# Patient Record
Sex: Male | Born: 1935 | Race: White | Hispanic: No | Marital: Married | Smoking: Never smoker
Health system: Southern US, Community
[De-identification: ages and names within clinical notes are randomized; demographics above are authoritative.]

## PROBLEM LIST (undated history)

## (undated) DIAGNOSIS — L84 Corns and callosities: Secondary | ICD-10-CM

## (undated) DIAGNOSIS — I10 Essential (primary) hypertension: Secondary | ICD-10-CM

## (undated) DIAGNOSIS — D649 Anemia, unspecified: Secondary | ICD-10-CM

## (undated) DIAGNOSIS — K289 Gastrojejunal ulcer, unspecified as acute or chronic, without hemorrhage or perforation: Secondary | ICD-10-CM

## (undated) DIAGNOSIS — E119 Type 2 diabetes mellitus without complications: Secondary | ICD-10-CM

## (undated) HISTORY — DX: Type 2 diabetes mellitus without complications: E11.9

## (undated) HISTORY — DX: Essential (primary) hypertension: I10

## (undated) HISTORY — DX: Corns and callosities: L84

## (undated) HISTORY — DX: Gastrojejunal ulcer, unspecified as acute or chronic, without hemorrhage or perforation: K28.9

## (undated) HISTORY — DX: Anemia, unspecified: D64.9

---

## 2000-10-10 ENCOUNTER — Emergency Department (HOSPITAL_COMMUNITY): Admission: EM | Admit: 2000-10-10 | Discharge: 2000-10-10 | Payer: Self-pay | Admitting: Emergency Medicine

## 2000-10-20 ENCOUNTER — Emergency Department (HOSPITAL_COMMUNITY): Admission: EM | Admit: 2000-10-20 | Discharge: 2000-10-20 | Payer: Self-pay | Admitting: Emergency Medicine

## 2006-12-21 ENCOUNTER — Inpatient Hospital Stay (HOSPITAL_COMMUNITY): Admission: AD | Admit: 2006-12-21 | Discharge: 2006-12-22 | Payer: Self-pay | Admitting: Endocrinology

## 2007-10-03 ENCOUNTER — Ambulatory Visit (HOSPITAL_COMMUNITY): Admission: RE | Admit: 2007-10-03 | Discharge: 2007-10-03 | Payer: Self-pay | Admitting: Ophthalmology

## 2008-01-03 ENCOUNTER — Ambulatory Visit (HOSPITAL_COMMUNITY): Admission: RE | Admit: 2008-01-03 | Discharge: 2008-01-03 | Payer: Self-pay | Admitting: Ophthalmology

## 2008-04-22 ENCOUNTER — Ambulatory Visit (HOSPITAL_COMMUNITY): Admission: RE | Admit: 2008-04-22 | Discharge: 2008-04-22 | Payer: Self-pay | Admitting: Ophthalmology

## 2008-05-06 ENCOUNTER — Ambulatory Visit (HOSPITAL_COMMUNITY): Admission: RE | Admit: 2008-05-06 | Discharge: 2008-05-06 | Payer: Self-pay | Admitting: Ophthalmology

## 2010-11-17 NOTE — Op Note (Signed)
[name], [name]             ACCOUNT NO.:  [id number]   MEDICAL RECORD NO.:  [id number]          PATIENT TYPE:  AMB   LOCATION:  SDS                          FACILITY:  [facility]   PHYSICIAN:  Clent Demark. Rankin, M.D.   DATE OF BIRTH:  [birth year]   DATE OF PROCEDURE:  04/22/2008  DATE OF DISCHARGE:  04/22/2008                               OPERATIVE REPORT   PREOPERATIVE DIAGNOSIS:  Nuclear sclerotic cataract, right eye.   POSTOPERATIVE DIAGNOSIS:  Nuclear sclerotic cataract, right eye.   PROCEDURES:  1. Phacoemulsification and cataract extraction with a posterior      chamber intraocular lens placement in the right eye in the sulcus.  2. Anterior vitrectomy, right eye after posterior capsule rupture.   SURGEON:  Clent Demark. Rankin, MD   ANESTHESIA:  Local retrobulbar, monitored anesthetic control.   INDICATIONS FOR PROCEDURE:  The patient is a 77 year old man who has  advanced nuclear sclerotic cataract.  He also has impairment of  activities of daily living on the right eye on the basis of dense  cataract.  He understands the risks in attempt to remove the cataract.  He also understands the risks for surgical intervention from the  underlying condition as well as surgical repair including but not  limited to hemorrhage, infection, scarring, need for another surgery, no  change in vision, loss of vision, and progressive disease despite  intervention.   PROCEDURE IN DETAIL:  Appropriate signed consent was obtained and the  patient was taken to the operating room.  In the operating room,  appropriate monitors were applied followed by mild sedation.  Xylocaine  2% was injected 5 mL retrobulbar with additional 5 mL laterally in  fashion of modified Kirk Ruths.  The right periocular region was sterilely  prepped and draped in the usual ophthalmic fashion.  Lid speculum was  applied.  A 2.5 mm diamond blade was then used to create a biplanar  clear corneal incision.  At this time, the  anterior chamber was deepened  with Viscoat.  Bent cystotome needle was then used to create a nice  round circular capsulorrhexis.  Hydrodissection was then carried out.  There was split that occurred in the rent in the anterior capsule which  may have gone on towards the equatorial region at the 5 o'clock  position.  This area had to be watched throughout the case.   Phacoemulsification was then carried on the bag.  After formation of a Y-  groove, segments were then fractured and then the cataract was removed  and these multiple segments fraction peripherally and then after the  plate was removed getting down to the last 3 o'clock hour superiorly, it  was noted that a rent developed in posterior capsule and apparent  knuckle of vitreous came forward.  Viscoat was then used to push this  back.  This allowed removal of all nuclear and epinuclear type material  superiorly.  Bimanual phacoemulsification was then used with the support  with a second instrument in the eye.   It was clear at this time that a large posterior capsule opening was  then present.  Wide vitrectomy was then carried out.  This allowed  removal of the herniated vitreous.  At this time, peripheral capsule was  inspected and found to be free of any large areas of missing anterior  capsule.  For this reason, the anterior chamber was now deepened and the  sulcus was opened with Provisc.  A [id number] lens style from [employer] with a  power +23 was selected and placed into the sulcus and rotated in  horizontal position.  Excellent position was obtained.  It must be noted  that the clear corneal incision was extended, so as to allow passage of  the awl, PMMA lens.  At this time, the anterior chamber was deepened  again.  The clear cornea incision was then closed with interrupted 10-0  nylon sutures and the knots were rotated to bury.  Wound was checked and  found to be secure.  Appropriate tension was left in the globe with BSS.   At this time, subconjunctival Decadron applied.  Sterile patch and Fox  shield applied.  The patient tolerated the procedure without  complication.      Clent Demark Rankin, M.D.  Electronically Signed     GAR/MEDQ  D:  04/22/2008  T:  04/23/2008  Job:  [id number]

## 2010-11-17 NOTE — Op Note (Signed)
[name], [name]             ACCOUNT NO.:  [id number]   MEDICAL RECORD NO.:  [id number]          PATIENT TYPE:  AMB   LOCATION:  SDS                          FACILITY:  [facility]   PHYSICIAN:  Clent Demark. Rankin, M.D.   DATE OF BIRTH:  [birth year]   DATE OF PROCEDURE:  DATE OF DISCHARGE:  05/06/2008                               OPERATIVE REPORT   PREOPERATIVE DIAGNOSIS:  Subluxed intraocular lens, right eye  inferiorly.   POSTOPERATIVE DIAGNOSIS:  Subluxed intraocular lens, right eye  inferiorly.   PROCEDURES:  1. Posterior vitrectomy - 25-gauge, right eye.  2. Repositioning of posterior chamber intraocular lens right eye into      the sulcus with anterior capsule, optic and haptics.   SURGEON:  Clent Demark. Rankin, MD   ANESTHESIA:  Local retrobulbar, monitored anesthesia control.   INDICATION FOR PROCEDURE:  The patient is a 77 year old man who has  distorted vision in the right eye on the basis of subluxed intraocular  lens.  The patient understands this an attempt to reposition and place  the lens into more secure position so as to prevent subluxation.  He  understands the risks of anesthesia including rare occurrence of death,  loss of the eye including, but not limited to hemorrhage, infection,  scarring, need further surgery, no change in vision, loss of vision,  progressive disease despite intervention.  Appropriate signed consent  was obtained.  The patient was taken to the operating room.  In the  operating room, appropriate monitors followed by mild sedation.  A 2%  Xylocaine 5 mL was injected into retrobulbar with additional 5 mL  laterally in fashion of a modified [employer].  The right periocular  region was sterilely prepped and draped in the usual sterile fashion.  Lid speculum was applied.  A 25-gauge trocar was placed in the  inferotemporal quadrant.  The infusion was then turned on.  Superior  trocars applied.  Core vitrectomy was then begun.   Vitreous skirt was  then trimmed.  Intraocular lens was identified to  have optic which had slipped posterior to the anterior capsule  leaflets.  The subluxation occurred centered inferiorly at the 5 o'clock  position.  The lens was supported posteriorly.  A paracentesis incision  was fashioned superiorly to the anterior chamber.  Anterior chamber was  deepened with Viscoat.  At this time, the IOL was supported from the  posterior site of the lens using a light plate.  Thereafter, a Wynetta Emery  IOL rotator was in place across paracentesis incision and this allowed  for the intraocular lens, we brought further into the sulcus.  The optic  was delivered anterior to the capsular leaflets.  The lens was then  rotated into a 9 and 3 o'clock position.  This gave excellent support,  excellent centration.  No complications occurred.  At this time, the  second look of the vitreous to confirm there was no holes or tears.  No  complications occurred.  The Viscoat in the anterior chamber was removed  with vitrectomy instrumentation.  At this time, the instruments were  removed from the  eye.  The superior trocar was removed under appropriate  pressures and the wounds were secured.  A 2-0 nylon suture was in place  in the opening as well as to close that opening superiorly and  the knot was rotated to bury.  Sterile patch and Fox shield applied  after subconjunctival Decadron applied as well as the infusion being  removed.  The patient was taken to the Short-Stay Unit and discharged  home as an outpatient without complications.      Clent Demark Rankin, M.D.  Electronically Signed     GAR/MEDQ  D:  05/06/2008  T:  05/07/2008  Job:  [id number]

## 2010-11-17 NOTE — H&P (Signed)
[name], [name]             ACCOUNT NO.:  [id number]   MEDICAL RECORD NO.:  [id number]          PATIENT TYPE:  INP   LOCATION:  3736                         FACILITY:  [facility]   PHYSICIAN:  Parke Poisson. Gegick, M.D.DATE OF BIRTH:  09/24/1927   DATE OF ADMISSION:  12/21/2006  DATE OF DISCHARGE:                              HISTORY & PHYSICAL   CHIEF COMPLAINT:  This is a 77 year old man who presents with a history  of hyperkalemia.   HISTORY OF PRESENT ILLNESS:  The patient was seen 2 days ago on a  routine office visit for follow-up.  It was learned at that time that he  was taking Aldactone instead of the prescribed 25 mg he was taking 50  mg.  He had was found to have a potassium level was 6.3.  He was treated  with increased Lasix.  The Aldactone was discontinued and return follow-  up visit shows his potassium is 6.6.  He does not have any renal  deficiency.  He does have a creatinine of 1.0.  The patient is  asymptomatic.  Electrocardiogram showed no changes.  The patient will be  admitted to the hospital for Kayexalate therapy.   He has a history of type 2 diabetes which has been present since 1979.  His A1C in June was 5.2%.  He has been receiving metformin plus Actos.   He has a history of hypertension that has been controlled and also has a  history of prostate hypertrophy.  In the past, he has had a peptic ulcer  disease and has had frequent GI bleeds, but since he has been taking  Zantac this has resolved.  He also has a history of pedal edema for  which he is taking Lasix.  Recently he was found to have pernicious  anemia.  His B12 level was 103 and his hemoglobin was 8.7.  His  hemoglobin now is 9.3, and he feels significantly improved now that he  is taking vitamin B12.   PAST MEDICAL HISTORY:  1. He has been hospitalized in [place] for esophageal      problems.  2. History of having osteomyelitis for approximately 20 years and      finally this  healed.   MEDICATIONS PRIOR TO THIS ADMISSION:  1. Glucophage 1000 mg one b.i.d.  2. Actos 45 mg one daily (recently stopped).  3. Cardizem CD 240 mg one daily.  4. Lasix 20 mg three daily.  5. Humibid p.r.n.  6. Cardura 8 mg 1 q.p.m.  7. Pravachol 40 mg one daily.   ALLERGIES:  No known drug allergies.   SOCIAL HISTORY:  He denies excessive alcoholic intake.   REVIEW OF SYSTEMS:  His weight has been stable, except he did lose 3  pounds the last 2 days.  CARDIOVASCULAR/RESPIRATORY:  No history of  chest pain.  GI: No complaints.   PHYSICAL EXAMINATION:  GENERAL:  This is a well-developed man who  appears in no distress.  LUNGS:  Clear.  CARDIOVASCULAR:  Exam rhythm is regular.  An electrocardiogram was done  and this was normal.  ABDOMEN:  Soft.  No masses are present.  No tenderness is present.  EXTREMITIES:  Show 3+ pedal edema.  NEUROMUSCULAR:  Grossly intact.   IMPRESSION:  1. Hyperkalemia secondary to previous Aldactone use.  2. Diabetes mellitus type 2.  3. History of hypertension.  4. History of benign prostatic hypertrophy.  5. History of peptic ulcer disease.  6. History of pernicious anemia.           ______________________________  Parke Poisson. Electa Sniff, M.D.     CGG/MEDQ  D:  12/21/2006  T:  12/21/2006  Job:  [id number]

## 2010-11-20 NOTE — Discharge Summary (Signed)
[name], TINCHER             ACCOUNT NO.:  [id number]   MEDICAL RECORD NO.:  [id number]          PATIENT TYPE:  INP   LOCATION:  3736                         FACILITY:  [facility]   PHYSICIAN:  Parke Poisson. Gegick, M.D.DATE OF BIRTH:  [birth year]   DATE OF ADMISSION:  12/21/2006  DATE OF DISCHARGE:  12/22/2006                               DISCHARGE SUMMARY   HISTORY:  This is a 77 year old man who presents with a history of  hyperkalemia.  The patient was seen 2 days prior in the office, and at  that time he was taking Aldactone which was prescribed as 25 mg.  He was  taking two of them.  He had a potassium level of 6.3.  He was treated  with Lasix as an outpatient, and Aldactone was discontinued but presents  now with a potassium of 6.6.  He did not have any symptoms.  His  creatinine was 1.0.  The patient was asymptomatic, but in fear of his  markedly elevated potassium, it was deemed necessary to admit the  patient.  Impression on admission was hyperkalemia, secondary to  previous Aldactone use, diabetes mellitus type 2, history of  hypertension, history of benign prostatic hypertrophy, history of peptic  ulcer disease, history of pernicious anemia.   HOSPITAL COURSE:  The patient was admitted to the hospital and was given  IV fluids.  His potassium was 6.6.  He was also given Lasix.  His  potassium decreased to 4.9 the following day.  He felt well.  There were  no essentially no symptoms at that time.  He was then discharged.   IMPRESSION:  On discharge:  Hyperkalemia secondary to inappropriate use  of Aldactone.  That is the diagnosis on discharge.   MEDICATIONS ON DISCHARGE:  He will continue his usual medications which  include Actos 45 mg once daily, Cardizem CD 240 mg, one daily; Lasix 20  mg one daily, Cardura 8 mg one each evening and Pravachol 40 mg one  daily.   DISCHARGE DIET:  Carbohydrate-restricted diet.   ACTIVITY ON DISCHARGE:  As tolerated.   FOLLOWUP:  He  will be seen in the office in a period of 3 weeks.   CONDITION ON DISCHARGE:  Improved.           ______________________________  Parke Poisson Electa Sniff, M.D.     CGG/MEDQ  D:  02/18/2007  T:  02/19/2007  Job:  [id number]

## 2011-03-29 LAB — CBC
HCT: 30.4 — ABNORMAL LOW
Hemoglobin: 10.3 — ABNORMAL LOW
MCHC: 33.8
MCV: 97.3
Platelets: 140 — ABNORMAL LOW
RBC: 3.13 — ABNORMAL LOW
RDW: 14.7
WBC: 5.7

## 2011-03-29 LAB — BASIC METABOLIC PANEL WITH GFR
BUN: 25 — ABNORMAL HIGH
CO2: 26
Calcium: 9.2
Chloride: 108
Creatinine, Ser: 1.24
GFR calc non Af Amer: 57 — ABNORMAL LOW
Glucose, Bld: 111 — ABNORMAL HIGH
Potassium: 4.2
Sodium: 141

## 2011-04-01 LAB — BASIC METABOLIC PANEL
CO2: 26
Creatinine, Ser: 1.38
GFR calc Af Amer: >60
GFR calc non Af Amer: 50 — ABNORMAL LOW
Sodium: 141

## 2011-04-01 LAB — CBC
MCV: 98
Platelets: 148 — ABNORMAL LOW
RBC: 3.02 — ABNORMAL LOW
RDW: 14.8

## 2011-04-05 LAB — BASIC METABOLIC PANEL
CO2: 24
Calcium: 9.5
Glucose, Bld: 134 — ABNORMAL HIGH

## 2011-04-05 LAB — CBC
MCHC: 33
MCV: 99.5

## 2011-04-05 LAB — GLUCOSE, CAPILLARY
Glucose-Capillary: 105 — ABNORMAL HIGH
Glucose-Capillary: 134 — ABNORMAL HIGH

## 2011-04-06 LAB — BASIC METABOLIC PANEL
BUN: 23
Calcium: 9.5
Chloride: 111
GFR calc Af Amer: >60
GFR calc non Af Amer: >60
Glucose, Bld: 125 — ABNORMAL HIGH

## 2011-04-06 LAB — CBC
HCT: 33 — ABNORMAL LOW
MCHC: 33.3
Platelets: 156
RBC: 3.3 — ABNORMAL LOW
WBC: 5.4

## 2011-04-21 LAB — CBC
Hemoglobin: 9.1 — ABNORMAL LOW
MCHC: 33
Platelets: 138 — ABNORMAL LOW
RBC: 2.77 — ABNORMAL LOW

## 2011-04-21 LAB — COMPREHENSIVE METABOLIC PANEL
Calcium: 9.3
Chloride: 116 — ABNORMAL HIGH
Creatinine, Ser: 1.15
GFR calc Af Amer: >60
Total Bilirubin: 0.4

## 2011-04-21 LAB — BASIC METABOLIC PANEL
BUN: 26 — ABNORMAL HIGH
CO2: 23
Calcium: 8.9
GFR calc Af Amer: >60
Glucose, Bld: 93
Potassium: 4.9
Sodium: 141

## 2011-04-21 LAB — CORTISOL: Cortisol, Plasma: 9

## 2011-07-16 DIAGNOSIS — E1149 Type 2 diabetes mellitus with other diabetic neurological complication: Secondary | ICD-10-CM | POA: Diagnosis not present

## 2011-07-19 DIAGNOSIS — E1139 Type 2 diabetes mellitus with other diabetic ophthalmic complication: Secondary | ICD-10-CM | POA: Diagnosis not present

## 2011-07-19 DIAGNOSIS — H43819 Vitreous degeneration, unspecified eye: Secondary | ICD-10-CM | POA: Diagnosis not present

## 2011-07-19 DIAGNOSIS — E11329 Type 2 diabetes mellitus with mild nonproliferative diabetic retinopathy without macular edema: Secondary | ICD-10-CM | POA: Diagnosis not present

## 2011-07-19 DIAGNOSIS — H2510 Age-related nuclear cataract, unspecified eye: Secondary | ICD-10-CM | POA: Diagnosis not present

## 2011-09-27 DIAGNOSIS — L820 Inflamed seborrheic keratosis: Secondary | ICD-10-CM | POA: Diagnosis not present

## 2011-10-22 DIAGNOSIS — E1149 Type 2 diabetes mellitus with other diabetic neurological complication: Secondary | ICD-10-CM | POA: Diagnosis not present

## 2011-10-22 DIAGNOSIS — L608 Other nail disorders: Secondary | ICD-10-CM | POA: Diagnosis not present

## 2011-10-25 DIAGNOSIS — I10 Essential (primary) hypertension: Secondary | ICD-10-CM | POA: Diagnosis not present

## 2011-10-25 DIAGNOSIS — E119 Type 2 diabetes mellitus without complications: Secondary | ICD-10-CM | POA: Diagnosis not present

## 2011-10-25 DIAGNOSIS — Z125 Encounter for screening for malignant neoplasm of prostate: Secondary | ICD-10-CM | POA: Diagnosis not present

## 2011-10-25 DIAGNOSIS — E785 Hyperlipidemia, unspecified: Secondary | ICD-10-CM | POA: Diagnosis not present

## 2011-11-01 DIAGNOSIS — I10 Essential (primary) hypertension: Secondary | ICD-10-CM | POA: Diagnosis not present

## 2011-11-01 DIAGNOSIS — R809 Proteinuria, unspecified: Secondary | ICD-10-CM | POA: Diagnosis not present

## 2011-11-01 DIAGNOSIS — Z0000 Encounter for general adult medical examination without abnormal findings: Secondary | ICD-10-CM | POA: Diagnosis not present

## 2011-11-01 DIAGNOSIS — E1129 Type 2 diabetes mellitus with other diabetic kidney complication: Secondary | ICD-10-CM | POA: Diagnosis not present

## 2011-11-01 DIAGNOSIS — Z125 Encounter for screening for malignant neoplasm of prostate: Secondary | ICD-10-CM | POA: Diagnosis not present

## 2011-11-02 DIAGNOSIS — Z1212 Encounter for screening for malignant neoplasm of rectum: Secondary | ICD-10-CM | POA: Diagnosis not present

## 2012-01-17 DIAGNOSIS — E1139 Type 2 diabetes mellitus with other diabetic ophthalmic complication: Secondary | ICD-10-CM | POA: Diagnosis not present

## 2012-01-17 DIAGNOSIS — H2510 Age-related nuclear cataract, unspecified eye: Secondary | ICD-10-CM | POA: Diagnosis not present

## 2012-01-17 DIAGNOSIS — E11329 Type 2 diabetes mellitus with mild nonproliferative diabetic retinopathy without macular edema: Secondary | ICD-10-CM | POA: Diagnosis not present

## 2012-01-18 DIAGNOSIS — E1149 Type 2 diabetes mellitus with other diabetic neurological complication: Secondary | ICD-10-CM | POA: Diagnosis not present

## 2012-01-18 DIAGNOSIS — Q828 Other specified congenital malformations of skin: Secondary | ICD-10-CM | POA: Diagnosis not present

## 2012-01-18 DIAGNOSIS — L608 Other nail disorders: Secondary | ICD-10-CM | POA: Diagnosis not present

## 2012-03-28 DIAGNOSIS — L821 Other seborrheic keratosis: Secondary | ICD-10-CM | POA: Diagnosis not present

## 2012-03-28 DIAGNOSIS — C4442 Squamous cell carcinoma of skin of scalp and neck: Secondary | ICD-10-CM | POA: Diagnosis not present

## 2012-03-28 DIAGNOSIS — L820 Inflamed seborrheic keratosis: Secondary | ICD-10-CM | POA: Diagnosis not present

## 2012-04-11 DIAGNOSIS — Q828 Other specified congenital malformations of skin: Secondary | ICD-10-CM | POA: Diagnosis not present

## 2012-04-11 DIAGNOSIS — L608 Other nail disorders: Secondary | ICD-10-CM | POA: Diagnosis not present

## 2012-04-11 DIAGNOSIS — E1149 Type 2 diabetes mellitus with other diabetic neurological complication: Secondary | ICD-10-CM | POA: Diagnosis not present

## 2012-04-17 DIAGNOSIS — Z23 Encounter for immunization: Secondary | ICD-10-CM | POA: Diagnosis not present

## 2012-05-22 DIAGNOSIS — D510 Vitamin B12 deficiency anemia due to intrinsic factor deficiency: Secondary | ICD-10-CM | POA: Diagnosis not present

## 2012-05-22 DIAGNOSIS — E1129 Type 2 diabetes mellitus with other diabetic kidney complication: Secondary | ICD-10-CM | POA: Diagnosis not present

## 2012-05-22 DIAGNOSIS — E785 Hyperlipidemia, unspecified: Secondary | ICD-10-CM | POA: Diagnosis not present

## 2012-05-22 DIAGNOSIS — Z1331 Encounter for screening for depression: Secondary | ICD-10-CM | POA: Diagnosis not present

## 2012-05-22 DIAGNOSIS — I10 Essential (primary) hypertension: Secondary | ICD-10-CM | POA: Diagnosis not present

## 2012-07-04 ENCOUNTER — Other Ambulatory Visit: Payer: Self-pay | Admitting: Internal Medicine

## 2012-07-04 DIAGNOSIS — R1310 Dysphagia, unspecified: Secondary | ICD-10-CM | POA: Diagnosis not present

## 2012-07-04 DIAGNOSIS — K219 Gastro-esophageal reflux disease without esophagitis: Secondary | ICD-10-CM | POA: Diagnosis not present

## 2012-07-04 DIAGNOSIS — K449 Diaphragmatic hernia without obstruction or gangrene: Secondary | ICD-10-CM | POA: Diagnosis not present

## 2012-07-13 ENCOUNTER — Ambulatory Visit
Admission: RE | Admit: 2012-07-13 | Discharge: 2012-07-13 | Disposition: A | Discharge status: Home / Self Care | Payer: Medicare Other | Source: Ambulatory Visit | Attending: Internal Medicine | Admitting: Internal Medicine

## 2012-07-13 DIAGNOSIS — K219 Gastro-esophageal reflux disease without esophagitis: Secondary | ICD-10-CM

## 2012-07-13 DIAGNOSIS — K220 Achalasia of cardia: Secondary | ICD-10-CM | POA: Diagnosis not present

## 2012-07-31 DIAGNOSIS — K220 Achalasia of cardia: Secondary | ICD-10-CM | POA: Diagnosis not present

## 2012-08-08 DIAGNOSIS — K220 Achalasia of cardia: Secondary | ICD-10-CM | POA: Diagnosis not present

## 2012-10-10 DIAGNOSIS — E1149 Type 2 diabetes mellitus with other diabetic neurological complication: Secondary | ICD-10-CM | POA: Diagnosis not present

## 2012-10-10 DIAGNOSIS — L608 Other nail disorders: Secondary | ICD-10-CM | POA: Diagnosis not present

## 2012-10-10 DIAGNOSIS — Q828 Other specified congenital malformations of skin: Secondary | ICD-10-CM | POA: Diagnosis not present

## 2012-10-16 DIAGNOSIS — E11329 Type 2 diabetes mellitus with mild nonproliferative diabetic retinopathy without macular edema: Secondary | ICD-10-CM | POA: Diagnosis not present

## 2012-10-16 DIAGNOSIS — H2510 Age-related nuclear cataract, unspecified eye: Secondary | ICD-10-CM | POA: Diagnosis not present

## 2012-10-16 DIAGNOSIS — E1139 Type 2 diabetes mellitus with other diabetic ophthalmic complication: Secondary | ICD-10-CM | POA: Diagnosis not present

## 2012-10-31 DIAGNOSIS — Z125 Encounter for screening for malignant neoplasm of prostate: Secondary | ICD-10-CM | POA: Diagnosis not present

## 2012-10-31 DIAGNOSIS — I10 Essential (primary) hypertension: Secondary | ICD-10-CM | POA: Diagnosis not present

## 2012-10-31 DIAGNOSIS — E785 Hyperlipidemia, unspecified: Secondary | ICD-10-CM | POA: Diagnosis not present

## 2012-10-31 DIAGNOSIS — E1129 Type 2 diabetes mellitus with other diabetic kidney complication: Secondary | ICD-10-CM | POA: Diagnosis not present

## 2012-10-31 DIAGNOSIS — D510 Vitamin B12 deficiency anemia due to intrinsic factor deficiency: Secondary | ICD-10-CM | POA: Diagnosis not present

## 2012-11-02 DIAGNOSIS — Z1212 Encounter for screening for malignant neoplasm of rectum: Secondary | ICD-10-CM | POA: Diagnosis not present

## 2012-11-07 DIAGNOSIS — Z125 Encounter for screening for malignant neoplasm of prostate: Secondary | ICD-10-CM | POA: Diagnosis not present

## 2012-11-07 DIAGNOSIS — Z6832 Body mass index (BMI) 32.0-32.9, adult: Secondary | ICD-10-CM | POA: Diagnosis not present

## 2012-11-07 DIAGNOSIS — D510 Vitamin B12 deficiency anemia due to intrinsic factor deficiency: Secondary | ICD-10-CM | POA: Diagnosis not present

## 2012-11-07 DIAGNOSIS — R1310 Dysphagia, unspecified: Secondary | ICD-10-CM | POA: Diagnosis not present

## 2012-11-07 DIAGNOSIS — K219 Gastro-esophageal reflux disease without esophagitis: Secondary | ICD-10-CM | POA: Diagnosis not present

## 2012-11-07 DIAGNOSIS — N400 Enlarged prostate without lower urinary tract symptoms: Secondary | ICD-10-CM | POA: Diagnosis not present

## 2012-11-07 DIAGNOSIS — E11319 Type 2 diabetes mellitus with unspecified diabetic retinopathy without macular edema: Secondary | ICD-10-CM | POA: Diagnosis not present

## 2012-11-07 DIAGNOSIS — K220 Achalasia of cardia: Secondary | ICD-10-CM | POA: Diagnosis not present

## 2012-11-07 DIAGNOSIS — Z0000 Encounter for general adult medical examination without abnormal findings: Secondary | ICD-10-CM | POA: Diagnosis not present

## 2013-01-09 DIAGNOSIS — E1149 Type 2 diabetes mellitus with other diabetic neurological complication: Secondary | ICD-10-CM | POA: Diagnosis not present

## 2013-01-09 DIAGNOSIS — Q828 Other specified congenital malformations of skin: Secondary | ICD-10-CM | POA: Diagnosis not present

## 2013-01-09 DIAGNOSIS — L608 Other nail disorders: Secondary | ICD-10-CM | POA: Diagnosis not present

## 2013-03-09 DIAGNOSIS — Z23 Encounter for immunization: Secondary | ICD-10-CM | POA: Diagnosis not present

## 2013-03-26 DIAGNOSIS — L821 Other seborrheic keratosis: Secondary | ICD-10-CM | POA: Diagnosis not present

## 2013-03-26 DIAGNOSIS — Z85828 Personal history of other malignant neoplasm of skin: Secondary | ICD-10-CM | POA: Diagnosis not present

## 2013-03-26 DIAGNOSIS — L570 Actinic keratosis: Secondary | ICD-10-CM | POA: Diagnosis not present

## 2013-03-29 ENCOUNTER — Encounter: Payer: Self-pay | Admitting: *Deleted

## 2013-03-29 DIAGNOSIS — E119 Type 2 diabetes mellitus without complications: Secondary | ICD-10-CM

## 2013-03-29 DIAGNOSIS — K289 Gastrojejunal ulcer, unspecified as acute or chronic, without hemorrhage or perforation: Secondary | ICD-10-CM | POA: Insufficient documentation

## 2013-04-06 ENCOUNTER — Ambulatory Visit (INDEPENDENT_AMBULATORY_CARE_PROVIDER_SITE_OTHER): Payer: Medicare Other

## 2013-04-06 VITALS — BP 142/64 | HR 86 | Resp 16 | Ht 74.0 in | Wt 250.0 lb

## 2013-04-06 DIAGNOSIS — E1149 Type 2 diabetes mellitus with other diabetic neurological complication: Secondary | ICD-10-CM | POA: Diagnosis not present

## 2013-04-06 DIAGNOSIS — B351 Tinea unguium: Secondary | ICD-10-CM

## 2013-04-06 DIAGNOSIS — Q828 Other specified congenital malformations of skin: Secondary | ICD-10-CM

## 2013-04-06 DIAGNOSIS — E1142 Type 2 diabetes mellitus with diabetic polyneuropathy: Secondary | ICD-10-CM

## 2013-04-06 NOTE — Patient Instructions (Addendum)

## 2013-04-06 NOTE — Progress Notes (Addendum)
°  Subjective:    Patient ID: [name], male    DOB: 1927/10/30, 79 y.o.   MRN: [id number]  HPI [name] resents at this time for followup diabetic foot and nail care. Patient has thick dystrophic friable nails x10 and hemorrhagic keratoses sub-first met right. The lesion plantar right foot is most painful and symptomatic due to increased activities in past weeks, patient was cutting down some trees patient continues to have +1 edema bilateral lower extremities history of cellulitis of his left lower leg. No open wounds or ulcers noted at this time     Review of Systems  Respiratory: Negative.   Cardiovascular: Positive for leg swelling.  Musculoskeletal: Positive for arthralgias.  Skin: Positive for wound.  Neurological: Negative.   Psychiatric/Behavioral: Negative.        Objective:   Physical Exam  Constitutional: He is oriented to person, place, and time. He appears well-developed and well-nourished.  HENT:  Head: Atraumatic.  Cardiovascular:  Pulses:      Dorsalis pedis pulses are 1+ on the right side, and 1+ on the left side.       Posterior tibial pulses are 2+ on the right side, and 2+ on the left side.  Capillary refill timed 3-4 seconds all digits temperature warm mild to moderate varicosities noted  Musculoskeletal: Normal range of motion. He exhibits edema.  Neurological: He is alert and oriented to person, place, and time. He has normal reflexes.  Skin: Skin is dry. No cyanosis. Nails show no clubbing.  Old area cellulitis right lower leg no active drainage or discharge. Pre-ulcer keratoses sub-first right no active drainage or discharge, tender on palpation weightbearing. Nails are thick discolored brittle and friable with onychomycosis and nail dystrophy one through 5 bilateral no secondary infection is noted. Assisted with onychomycosis  Psychiatric: He has a normal mood and affect. His behavior is normal. Judgment and thought content normal.           Assessment & Plan:  Assessment diabetes with cutting factor diminished neurovascular status noted absent epicritic and proprioceptive sensations in [employer] testing the forefoot and digits thick frontal mycotic nails debrided x10 and multiple keratoses debridement someone 5 right maintain appropriate diabetic accommodative shoes at all times. Reappointed 3 months for palliative care  Harriet Masson DPM

## 2013-05-22 DIAGNOSIS — D510 Vitamin B12 deficiency anemia due to intrinsic factor deficiency: Secondary | ICD-10-CM | POA: Diagnosis not present

## 2013-05-22 DIAGNOSIS — K220 Achalasia of cardia: Secondary | ICD-10-CM | POA: Diagnosis not present

## 2013-05-22 DIAGNOSIS — E1129 Type 2 diabetes mellitus with other diabetic kidney complication: Secondary | ICD-10-CM | POA: Diagnosis not present

## 2013-05-22 DIAGNOSIS — M1990 Unspecified osteoarthritis, unspecified site: Secondary | ICD-10-CM | POA: Diagnosis not present

## 2013-05-22 DIAGNOSIS — Z1331 Encounter for screening for depression: Secondary | ICD-10-CM | POA: Diagnosis not present

## 2013-05-22 DIAGNOSIS — N529 Male erectile dysfunction, unspecified: Secondary | ICD-10-CM | POA: Diagnosis not present

## 2013-05-22 DIAGNOSIS — R809 Proteinuria, unspecified: Secondary | ICD-10-CM | POA: Diagnosis not present

## 2013-05-22 DIAGNOSIS — E669 Obesity, unspecified: Secondary | ICD-10-CM | POA: Diagnosis not present

## 2013-05-22 DIAGNOSIS — E11319 Type 2 diabetes mellitus with unspecified diabetic retinopathy without macular edema: Secondary | ICD-10-CM | POA: Diagnosis not present

## 2013-05-25 DIAGNOSIS — I10 Essential (primary) hypertension: Secondary | ICD-10-CM | POA: Diagnosis not present

## 2013-05-28 DIAGNOSIS — E875 Hyperkalemia: Secondary | ICD-10-CM | POA: Diagnosis not present

## 2013-06-01 DIAGNOSIS — E875 Hyperkalemia: Secondary | ICD-10-CM | POA: Diagnosis not present

## 2013-07-10 ENCOUNTER — Telehealth: Payer: Self-pay | Admitting: *Deleted

## 2013-07-10 ENCOUNTER — Other Ambulatory Visit: Payer: Self-pay

## 2013-07-10 ENCOUNTER — Ambulatory Visit (INDEPENDENT_AMBULATORY_CARE_PROVIDER_SITE_OTHER): Payer: Medicare Other

## 2013-07-10 VITALS — BP 129/62 | HR 74 | Resp 12

## 2013-07-10 DIAGNOSIS — E119 Type 2 diabetes mellitus without complications: Secondary | ICD-10-CM

## 2013-07-10 DIAGNOSIS — L03119 Cellulitis of unspecified part of limb: Principal | ICD-10-CM

## 2013-07-10 DIAGNOSIS — E1149 Type 2 diabetes mellitus with other diabetic neurological complication: Secondary | ICD-10-CM | POA: Diagnosis not present

## 2013-07-10 DIAGNOSIS — E1142 Type 2 diabetes mellitus with diabetic polyneuropathy: Secondary | ICD-10-CM | POA: Diagnosis not present

## 2013-07-10 DIAGNOSIS — L02619 Cutaneous abscess of unspecified foot: Secondary | ICD-10-CM

## 2013-07-10 DIAGNOSIS — Q828 Other specified congenital malformations of skin: Secondary | ICD-10-CM

## 2013-07-10 DIAGNOSIS — B351 Tinea unguium: Secondary | ICD-10-CM

## 2013-07-10 DIAGNOSIS — L608 Other nail disorders: Secondary | ICD-10-CM

## 2013-07-10 MED ORDER — CEPHALEXIN 500 MG PO CAPS: 500.0000 mg | ORAL_CAPSULE | Freq: Three times a day (TID) | ORAL | Status: DC

## 2013-07-10 MED ORDER — SILVER SULFADIAZINE 1 % EX CREA: 1.0000 "application " | TOPICAL_CREAM | Freq: Every day | CUTANEOUS | Status: DC

## 2013-07-10 NOTE — Patient Instructions (Signed)
ANTIBACTERIAL SOAP INSTRUCTIONS  THE DAY AFTER PROCEDURE  Please follow the instructions your doctor has marked.   Shower as usual. Before getting out, place a drop of antibacterial liquid soap (Dial) on a wet, clean washcloth.  Gently wipe washcloth over affected area.  Afterward, rinse the area with warm water.  Blot the area dry with a soft cloth and cover with antibiotic ointment (neosporin, polysporin, bacitracin) and band aid or gauze and tape  Place 3-4 drops of antibacterial liquid soap in a quart of warm tap water.  Submerge foot into water for 20 minutes.  If bandage was applied after your procedure, leave on to allow for easy lift off, then remove and continue with soak for the remaining time.  Next, blot area dry with a soft cloth and cover with a bandage.  Apply other medications as directed by your doctor, such as cortisporin otic solution (eardrops) or neosporin antibiotic ointment  Apply Silvadene antibiotic ointment and a gauze dressing daily as instructed. Maintain diabetic shoes at all times.

## 2013-07-10 NOTE — Telephone Encounter (Signed)
Specimen from Right 1st metatarsal sent for culture and sensitivity.

## 2013-07-10 NOTE — Progress Notes (Signed)
   Subjective:    Patient ID: [name], male    DOB: [birth year], 80 y.o.   MRN: [id number]  HPI Comments: '' TOENAILS AND CALLUS TRIM.''     Review of Systems  Constitutional: Negative.   Eyes: Negative.   Respiratory: Negative.   Cardiovascular: Positive for leg swelling.  Gastrointestinal: Negative.   Endocrine: Negative.   Genitourinary: Negative.   Musculoskeletal: Positive for gait problem.  Skin: Positive for color change.  Allergic/Immunologic: Negative.   Neurological: Positive for numbness.  Hematological: Negative.   Psychiatric/Behavioral: Negative.   All other systems reviewed and are negative.       Objective:   Physical Exam Neurovascular status as follows pedal pulses DP plus one over 4 bilateral PT +2/4 bilateral Refill timed 3-4 seconds neurologically epicritic and proprioceptive sensations grossly diminished on Sem[employer] Weinstein testing. There is a history of fall stasis ulceration the right ankle with hyperpigmentation noted. Patient does have plantarflexed first metatarsal right with hemorrhage a keratoses and debridement at the tibia abscess sub-first MTP area right foot. Nails thick brittle crumbly friable gratified with discoloration and tenderness on palpation and with enclosed shoe wear. No descending cellulitis lymphangitis. There is absence with dried blood supports MTP area right patient cases been hurting more from last couple of weeks is uncertain as to why this is explained are some discharge drainage culture and sensitivity obtained at this time.       Assessment & Plan:  Assessment diabetes with history peripheral neuropathy debridement of dystrophic friable criptotic nails 1 through 5 bilateral carried out at this time also still keratotic lesion sub-1 right returning to debridement of ulceration/abscess culture and sensitivity are obtained patient placed on cephalexin and Silvadene cream dressing changes daily as instructed oral and written  instructions are given recheck in 2 weeks for followup of abscess ulcer 3 month followup for continued palliative nail care in the future as needed. Contact us visiting changes or exacerbations in your interim. Maintain diabetic shoes with pocketing.  Harriet Masson DPM

## 2013-07-14 LAB — CULTURE, ROUTINE-ABSCESS
GRAM STAIN: NONE SEEN
Gram Stain: NONE SEEN
Gram Stain: NONE SEEN

## 2013-07-16 DIAGNOSIS — H35049 Retinal micro-aneurysms, unspecified, unspecified eye: Secondary | ICD-10-CM | POA: Diagnosis not present

## 2013-07-16 DIAGNOSIS — E1139 Type 2 diabetes mellitus with other diabetic ophthalmic complication: Secondary | ICD-10-CM | POA: Diagnosis not present

## 2013-07-16 DIAGNOSIS — E11339 Type 2 diabetes mellitus with moderate nonproliferative diabetic retinopathy without macular edema: Secondary | ICD-10-CM | POA: Diagnosis not present

## 2013-07-24 ENCOUNTER — Ambulatory Visit (INDEPENDENT_AMBULATORY_CARE_PROVIDER_SITE_OTHER): Payer: Medicare Other

## 2013-07-24 VITALS — BP 136/64 | HR 84 | Resp 12

## 2013-07-24 DIAGNOSIS — L02619 Cutaneous abscess of unspecified foot: Secondary | ICD-10-CM

## 2013-07-24 DIAGNOSIS — L03119 Cellulitis of unspecified part of limb: Secondary | ICD-10-CM

## 2013-07-24 DIAGNOSIS — Q828 Other specified congenital malformations of skin: Secondary | ICD-10-CM

## 2013-07-24 DIAGNOSIS — E1149 Type 2 diabetes mellitus with other diabetic neurological complication: Secondary | ICD-10-CM

## 2013-07-24 NOTE — Patient Instructions (Signed)
Diabetes and Foot Care Diabetes may cause you to have problems because of poor blood supply (circulation) to your feet and legs. This may cause the skin on your feet to become thinner, break easier, and heal more slowly. Your skin may become dry, and the skin may peel and crack. You may also have nerve damage in your legs and feet causing decreased feeling in them. You may not notice minor injuries to your feet that could lead to infections or more serious problems. Taking care of your feet is one of the most important things you can do for yourself.  HOME CARE INSTRUCTIONS  Wear shoes at all times, even in the house. Do not go barefoot. Bare feet are easily injured.  Check your feet daily for blisters, cuts, and redness. If you cannot see the bottom of your feet, use a mirror or ask someone for help.  Wash your feet with warm water (do not use hot water) and mild soap. Then pat your feet and the areas between your toes until they are completely dry. Do not soak your feet as this can dry your skin.  Apply a moisturizing lotion or petroleum jelly (that does not contain alcohol and is unscented) to the skin on your feet and to dry, brittle toenails. Do not apply lotion between your toes.  Trim your toenails straight across. Do not dig under them or around the cuticle. File the edges of your nails with an emery board or nail file.  Do not cut corns or calluses or try to remove them with medicine.  Wear clean socks or stockings every day. Make sure they are not too tight. Do not wear knee-high stockings since they may decrease blood flow to your legs.  Wear shoes that fit properly and have enough cushioning. To break in new shoes, wear them for just a few hours a day. This prevents you from injuring your feet. Always look in your shoes before you put them on to be sure there are no objects inside.  Do not cross your legs. This may decrease the blood flow to your feet.  If you find a minor scrape,  cut, or break in the skin on your feet, keep it and the skin around it clean and dry. These areas may be cleansed with mild soap and water. Do not cleanse the area with peroxide, alcohol, or iodine.  When you remove an adhesive bandage, be sure not to damage the skin around it.  If you have a wound, look at it several times a day to make sure it is healing.  Do not use heating pads or hot water bottles. They may burn your skin. If you have lost feeling in your feet or legs, you may not know it is happening until it is too late.  Make sure your health care provider performs a complete foot exam at least annually or more often if you have foot problems. Report any cuts, sores, or bruises to your health care provider immediately. SEEK MEDICAL CARE IF:   You have an injury that is not healing.  You have cuts or breaks in the skin.  You have an ingrown nail.  You notice redness on your legs or feet.  You feel burning or tingling in your legs or feet.  You have pain or cramps in your legs and feet.  Your legs or feet are numb.  Your feet always feel cold. SEEK IMMEDIATE MEDICAL CARE IF:   There is increasing redness,   swelling, or pain in or around a wound.  There is a red line that goes up your leg.  Pus is coming from a wound.  You develop a fever or as directed by your health care provider.  You notice a bad smell coming from an ulcer or wound. Document Released: 06/18/2000 Document Revised: 02/21/2013 Document Reviewed: 11/28/2012 [employer] Patient Information 2014 [employer].  

## 2013-07-24 NOTE — Progress Notes (Signed)
   Subjective:    Patient ID: LAKOTA ZELAZNY, male    DOB: 1927/12/15, 80 y.o.   MRN: [id number]  HPI Comments: '' RT FOOT DOING MUCH BETTER.''     Review of Systems no new changes or findings     Objective:   Physical Exam Neurovascular status is intact with pedal pulses DP plus one over 4 PT +2/4 bilateral Refill timed 3-4 bilateral epicritic and proprioceptive sensations intact although diminished on Semmes Weinstein testing to the digits and forefoot bilateral. Right plantar foot shows hemorrhagic keratotic area sub-first and fifth MTP area there is no active discharge or drainage. Cultures were negative for growth no strap multiple organisms with no predominating. Most likely superficial contaminated. At this time the wounds or keratoses are debrided back there is no active discharge or drainage no open ulceration the dressings were dry at this time maintain Silvadene and a Band-Aid for next 3 or 4 days to the first MTP area been discontinued. Maintain depth her diabetic type shoes as instructed       Assessment & Plan:  Assessment diabetes with complications patient does have history of abscess/ulceration sub-first MTP area right foot. The abscess is resolved there is no ascending cellulitis lymphangitis no discharge or drainage patient completed her antibiotic regimen as instructed to maintain accommodative shoes recommend a 3 month followup for palliative and diabetic foot nail care.  Harriet Masson DPM

## 2013-08-24 DIAGNOSIS — I10 Essential (primary) hypertension: Secondary | ICD-10-CM | POA: Diagnosis not present

## 2013-08-24 DIAGNOSIS — D631 Anemia in chronic kidney disease: Secondary | ICD-10-CM | POA: Diagnosis not present

## 2013-08-24 DIAGNOSIS — N183 Chronic kidney disease, stage 3 unspecified: Secondary | ICD-10-CM | POA: Diagnosis not present

## 2013-08-24 DIAGNOSIS — N390 Urinary tract infection, site not specified: Secondary | ICD-10-CM | POA: Diagnosis not present

## 2013-08-24 DIAGNOSIS — N2581 Secondary hyperparathyroidism of renal origin: Secondary | ICD-10-CM | POA: Diagnosis not present

## 2013-08-31 DIAGNOSIS — N183 Chronic kidney disease, stage 3 unspecified: Secondary | ICD-10-CM | POA: Diagnosis not present

## 2013-10-23 ENCOUNTER — Ambulatory Visit (INDEPENDENT_AMBULATORY_CARE_PROVIDER_SITE_OTHER): Payer: Medicare Other

## 2013-10-23 VITALS — BP 145/56 | HR 70 | Resp 16

## 2013-10-23 DIAGNOSIS — L608 Other nail disorders: Secondary | ICD-10-CM

## 2013-10-23 DIAGNOSIS — Q828 Other specified congenital malformations of skin: Secondary | ICD-10-CM | POA: Diagnosis not present

## 2013-10-23 DIAGNOSIS — E1149 Type 2 diabetes mellitus with other diabetic neurological complication: Secondary | ICD-10-CM

## 2013-10-23 NOTE — Progress Notes (Signed)
   Subjective:    Patient ID: [name], male    DOB: 12/06/1927, 80 y.o.   MRN: [id number]  HPI Comments: "Cut the toenails and calluses"  Calluses:  sub 1st and 5th MPJ right and tip 2nd toe right     Review of Systems no new systemic changes or findings noted     Objective:   Physical Exam 80 year old white male well-developed well-nourished oriented x3 presents this time for diabetic foot and nail care masker status is intact with pedal pulses DP plus one over 4 PT +2/4 bilateral Refill time 3 seconds all digits there signs of old cellulitis of his right leg has decreased motor and sensory functions on his right is post left leg rigid digital contractures 2 through 5 bilateral nails thick brittle yellow crumbly discolored friable consistent with onychomycosis and dystrophy of nails hallux nails bilateral most severe and tender and painful patient is a history of ulceration sub-first and fifth right with hemorrhage a keratoses being present however no active wounds ulcerations no secondary infection noted patient's Refill time 3 seconds all digits absent epicritic sensation Semmes Weinstein testing is noted       Assessment & Plan:   assessment this time diabetes with complications peripheral neuropathy at this time dystrophic nails friable criptotic nails debrided 1 through 5 bilateral lumicain Neosporin applied the hallux bilateral following debridement also at this time debridement of keratoses sub-1 and 5 right turn for future palliative care and as-needed basis maintain good shoes and appropriate coming shoes at all times next  [employer] DPM

## 2013-10-23 NOTE — Patient Instructions (Signed)
Diabetes and Foot Care Diabetes may cause you to have problems because of poor blood supply (circulation) to your feet and legs. This may cause the skin on your feet to become thinner, break easier, and heal more slowly. Your skin may become dry, and the skin may peel and crack. You may also have nerve damage in your legs and feet causing decreased feeling in them. You may not notice minor injuries to your feet that could lead to infections or more serious problems. Taking care of your feet is one of the most important things you can do for yourself.  HOME CARE INSTRUCTIONS  Wear shoes at all times, even in the house. Do not go barefoot. Bare feet are easily injured.  Check your feet daily for blisters, cuts, and redness. If you cannot see the bottom of your feet, use a mirror or ask someone for help.  Wash your feet with warm water (do not use hot water) and mild soap. Then pat your feet and the areas between your toes until they are completely dry. Do not soak your feet as this can dry your skin.  Apply a moisturizing lotion or petroleum jelly (that does not contain alcohol and is unscented) to the skin on your feet and to dry, brittle toenails. Do not apply lotion between your toes.  Trim your toenails straight across. Do not dig under them or around the cuticle. File the edges of your nails with an emery board or nail file.  Do not cut corns or calluses or try to remove them with medicine.  Wear clean socks or stockings every day. Make sure they are not too tight. Do not wear knee-high stockings since they may decrease blood flow to your legs.  Wear shoes that fit properly and have enough cushioning. To break in new shoes, wear them for just a few hours a day. This prevents you from injuring your feet. Always look in your shoes before you put them on to be sure there are no objects inside.  Do not cross your legs. This may decrease the blood flow to your feet.  If you find a minor scrape,  cut, or break in the skin on your feet, keep it and the skin around it clean and dry. These areas may be cleansed with mild soap and water. Do not cleanse the area with peroxide, alcohol, or iodine.  When you remove an adhesive bandage, be sure not to damage the skin around it.  If you have a wound, look at it several times a day to make sure it is healing.  Do not use heating pads or hot water bottles. They may burn your skin. If you have lost feeling in your feet or legs, you may not know it is happening until it is too late.  Make sure your health care provider performs a complete foot exam at least annually or more often if you have foot problems. Report any cuts, sores, or bruises to your health care provider immediately. SEEK MEDICAL CARE IF:   You have an injury that is not healing.  You have cuts or breaks in the skin.  You have an ingrown nail.  You notice redness on your legs or feet.  You feel burning or tingling in your legs or feet.  You have pain or cramps in your legs and feet.  Your legs or feet are numb.  Your feet always feel cold. SEEK IMMEDIATE MEDICAL CARE IF:   There is increasing redness,   swelling, or pain in or around a wound.  There is a red line that goes up your leg.  Pus is coming from a wound.  You develop a fever or as directed by your health care provider.  You notice a bad smell coming from an ulcer or wound. Document Released: 06/18/2000 Document Revised: 02/21/2013 Document Reviewed: 11/28/2012 [employer] Patient Information 2014 [employer].  

## 2013-11-12 DIAGNOSIS — E785 Hyperlipidemia, unspecified: Secondary | ICD-10-CM | POA: Diagnosis not present

## 2013-11-12 DIAGNOSIS — R809 Proteinuria, unspecified: Secondary | ICD-10-CM | POA: Diagnosis not present

## 2013-11-12 DIAGNOSIS — R82998 Other abnormal findings in urine: Secondary | ICD-10-CM | POA: Diagnosis not present

## 2013-11-12 DIAGNOSIS — E1129 Type 2 diabetes mellitus with other diabetic kidney complication: Secondary | ICD-10-CM | POA: Diagnosis not present

## 2013-11-12 DIAGNOSIS — Z125 Encounter for screening for malignant neoplasm of prostate: Secondary | ICD-10-CM | POA: Diagnosis not present

## 2013-11-15 DIAGNOSIS — Z1212 Encounter for screening for malignant neoplasm of rectum: Secondary | ICD-10-CM | POA: Diagnosis not present

## 2013-11-19 DIAGNOSIS — M1990 Unspecified osteoarthritis, unspecified site: Secondary | ICD-10-CM | POA: Diagnosis not present

## 2013-11-19 DIAGNOSIS — N183 Chronic kidney disease, stage 3 unspecified: Secondary | ICD-10-CM | POA: Diagnosis not present

## 2013-11-19 DIAGNOSIS — Z125 Encounter for screening for malignant neoplasm of prostate: Secondary | ICD-10-CM | POA: Diagnosis not present

## 2013-11-19 DIAGNOSIS — Z0000 Encounter for general adult medical examination without abnormal findings: Secondary | ICD-10-CM | POA: Diagnosis not present

## 2013-11-19 DIAGNOSIS — E1129 Type 2 diabetes mellitus with other diabetic kidney complication: Secondary | ICD-10-CM | POA: Diagnosis not present

## 2013-11-19 DIAGNOSIS — K219 Gastro-esophageal reflux disease without esophagitis: Secondary | ICD-10-CM | POA: Diagnosis not present

## 2013-11-19 DIAGNOSIS — N400 Enlarged prostate without lower urinary tract symptoms: Secondary | ICD-10-CM | POA: Diagnosis not present

## 2013-11-19 DIAGNOSIS — E785 Hyperlipidemia, unspecified: Secondary | ICD-10-CM | POA: Diagnosis not present

## 2013-11-19 DIAGNOSIS — K220 Achalasia of cardia: Secondary | ICD-10-CM | POA: Diagnosis not present

## 2013-11-27 DIAGNOSIS — N2581 Secondary hyperparathyroidism of renal origin: Secondary | ICD-10-CM | POA: Diagnosis not present

## 2013-11-27 DIAGNOSIS — D631 Anemia in chronic kidney disease: Secondary | ICD-10-CM | POA: Diagnosis not present

## 2013-11-27 DIAGNOSIS — I10 Essential (primary) hypertension: Secondary | ICD-10-CM | POA: Diagnosis not present

## 2013-11-27 DIAGNOSIS — N183 Chronic kidney disease, stage 3 unspecified: Secondary | ICD-10-CM | POA: Diagnosis not present

## 2014-01-25 ENCOUNTER — Ambulatory Visit: Payer: Medicare Other

## 2014-02-12 ENCOUNTER — Ambulatory Visit (INDEPENDENT_AMBULATORY_CARE_PROVIDER_SITE_OTHER): Payer: Medicare Other

## 2014-02-12 DIAGNOSIS — E1149 Type 2 diabetes mellitus with other diabetic neurological complication: Secondary | ICD-10-CM

## 2014-02-12 DIAGNOSIS — L608 Other nail disorders: Secondary | ICD-10-CM | POA: Diagnosis not present

## 2014-02-12 DIAGNOSIS — Q828 Other specified congenital malformations of skin: Secondary | ICD-10-CM | POA: Diagnosis not present

## 2014-02-12 NOTE — Patient Instructions (Signed)
Diabetes and Foot Care Diabetes may cause you to have problems because of poor blood supply (circulation) to your feet and legs. This may cause the skin on your feet to become thinner, break easier, and heal more slowly. Your skin may become dry, and the skin may peel and crack. You may also have nerve damage in your legs and feet causing decreased feeling in them. You may not notice minor injuries to your feet that could lead to infections or more serious problems. Taking care of your feet is one of the most important things you can do for yourself.  HOME CARE INSTRUCTIONS  Wear shoes at all times, even in the house. Do not go barefoot. Bare feet are easily injured.  Check your feet daily for blisters, cuts, and redness. If you cannot see the bottom of your feet, use a mirror or ask someone for help.  Wash your feet with warm water (do not use hot water) and mild soap. Then pat your feet and the areas between your toes until they are completely dry. Do not soak your feet as this can dry your skin.  Apply a moisturizing lotion or petroleum jelly (that does not contain alcohol and is unscented) to the skin on your feet and to dry, brittle toenails. Do not apply lotion between your toes.  Trim your toenails straight across. Do not dig under them or around the cuticle. File the edges of your nails with an emery board or nail file.  Do not cut corns or calluses or try to remove them with medicine.  Wear clean socks or stockings every day. Make sure they are not too tight. Do not wear knee-high stockings since they may decrease blood flow to your legs.  Wear shoes that fit properly and have enough cushioning. To break in new shoes, wear them for just a few hours a day. This prevents you from injuring your feet. Always look in your shoes before you put them on to be sure there are no objects inside.  Do not cross your legs. This may decrease the blood flow to your feet.  If you find a minor scrape,  cut, or break in the skin on your feet, keep it and the skin around it clean and dry. These areas may be cleansed with mild soap and water. Do not cleanse the area with peroxide, alcohol, or iodine.  When you remove an adhesive bandage, be sure not to damage the skin around it.  If you have a wound, look at it several times a day to make sure it is healing.  Do not use heating pads or hot water bottles. They may burn your skin. If you have lost feeling in your feet or legs, you may not know it is happening until it is too late.  Make sure your health care provider performs a complete foot exam at least annually or more often if you have foot problems. Report any cuts, sores, or bruises to your health care provider immediately. SEEK MEDICAL CARE IF:   You have an injury that is not healing.  You have cuts or breaks in the skin.  You have an ingrown nail.  You notice redness on your legs or feet.  You feel burning or tingling in your legs or feet.  You have pain or cramps in your legs and feet.  Your legs or feet are numb.  Your feet always feel cold. SEEK IMMEDIATE MEDICAL CARE IF:   There is increasing redness,   swelling, or pain in or around a wound.  There is a red line that goes up your leg.  Pus is coming from a wound.  You develop a fever or as directed by your health care provider.  You notice a bad smell coming from an ulcer or wound. Document Released: 06/18/2000 Document Revised: 02/21/2013 Document Reviewed: 11/28/2012 [employer] Patient Information 2015 [employer]. This information is not intended to replace advice given to you by your health care provider. Make sure you discuss any questions you have with your health care provider.  

## 2014-02-12 NOTE — Progress Notes (Signed)
   Subjective:    Patient ID: [name], male    DOB: 1927/11/23, 80 y.o.   MRN: [id number]  HPI patient presents this time for debridement of nails and calluses both feet.    Review of Systems no new changes or significant findings noted     Objective:   Physical Exam 80 year old white male well-developed well-nourished oriented x3 presents this time for diabetic foot and nail care. Pedal pulses DP plus one over 4 bilateral PT +2/4 bilateral there is some consistent varicosities noted with venous stasis dermatitis both lower extremities noted does wear compression stockings although not wearing them today. Nails thick brittle crumbly friable dystrophic 1 through 5 bilateral has rigid digital contractures rigid right foot and ankle on the right status post left. Nails 1 through 5 bilateral deformed dystrophic friable gratified with discoloration consistent with onychomycosis and dystrophy of nails also keratoses distal clavus second right and sub-1 and 5 right with hemorrhage a keratoses or pre-ulceration being noted no active ulcer no secondary infections no ascending psoas lymphangitis noted there is decreased sensation confirmed on Semmes Weinstein testing to forefoot digits and arch bilateral.       Assessment & Plan:  Assessment this time his diabetes with complications of neurovascular compromise and dystrophy of nails thick brittle crumbly painful mycotic nails 1 through 5 bilateral are debrided and the presence of diabetes and complications return for future palliative nail care is needed also read keratoses sub-1 and 5 right and distal clavus second right followup in 3 months for continued palliative care in the future as needed  Harriet Masson DPM

## 2014-03-21 DIAGNOSIS — Z23 Encounter for immunization: Secondary | ICD-10-CM | POA: Diagnosis not present

## 2014-03-25 DIAGNOSIS — L820 Inflamed seborrheic keratosis: Secondary | ICD-10-CM | POA: Diagnosis not present

## 2014-03-25 DIAGNOSIS — Z85828 Personal history of other malignant neoplasm of skin: Secondary | ICD-10-CM | POA: Diagnosis not present

## 2014-03-25 DIAGNOSIS — L570 Actinic keratosis: Secondary | ICD-10-CM | POA: Diagnosis not present

## 2014-03-25 DIAGNOSIS — L821 Other seborrheic keratosis: Secondary | ICD-10-CM | POA: Diagnosis not present

## 2014-04-15 DIAGNOSIS — E11359 Type 2 diabetes mellitus with proliferative diabetic retinopathy without macular edema: Secondary | ICD-10-CM | POA: Diagnosis not present

## 2014-04-15 DIAGNOSIS — H2512 Age-related nuclear cataract, left eye: Secondary | ICD-10-CM | POA: Diagnosis not present

## 2014-04-15 DIAGNOSIS — E11329 Type 2 diabetes mellitus with mild nonproliferative diabetic retinopathy without macular edema: Secondary | ICD-10-CM | POA: Diagnosis not present

## 2014-05-20 ENCOUNTER — Encounter: Payer: Self-pay | Admitting: Podiatry

## 2014-05-20 ENCOUNTER — Ambulatory Visit (INDEPENDENT_AMBULATORY_CARE_PROVIDER_SITE_OTHER): Payer: Medicare Other | Admitting: Podiatry

## 2014-05-20 DIAGNOSIS — E1149 Type 2 diabetes mellitus with other diabetic neurological complication: Secondary | ICD-10-CM

## 2014-05-20 DIAGNOSIS — E1140 Type 2 diabetes mellitus with diabetic neuropathy, unspecified: Secondary | ICD-10-CM | POA: Diagnosis not present

## 2014-05-20 DIAGNOSIS — M79676 Pain in unspecified toe(s): Secondary | ICD-10-CM | POA: Diagnosis not present

## 2014-05-20 DIAGNOSIS — Q828 Other specified congenital malformations of skin: Secondary | ICD-10-CM | POA: Diagnosis not present

## 2014-05-20 DIAGNOSIS — B351 Tinea unguium: Secondary | ICD-10-CM

## 2014-05-20 NOTE — Patient Instructions (Signed)
Obtain certification for diabetic shoes  Diabetes and Foot Care Diabetes may cause you to have problems because of poor blood supply (circulation) to your feet and legs. This may cause the skin on your feet to become thinner, break easier, and heal more slowly. Your skin may become dry, and the skin may peel and crack. You may also have nerve damage in your legs and feet causing decreased feeling in them. You may not notice minor injuries to your feet that could lead to infections or more serious problems. Taking care of your feet is one of the most important things you can do for yourself.  HOME CARE INSTRUCTIONS  Wear shoes at all times, even in the house. Do not go barefoot. Bare feet are easily injured.  Check your feet daily for blisters, cuts, and redness. If you cannot see the bottom of your feet, use a mirror or ask someone for help.  Wash your feet with warm water (do not use hot water) and mild soap. Then pat your feet and the areas between your toes until they are completely dry. Do not soak your feet as this can dry your skin.  Apply a moisturizing lotion or petroleum jelly (that does not contain alcohol and is unscented) to the skin on your feet and to dry, brittle toenails. Do not apply lotion between your toes.  Trim your toenails straight across. Do not dig under them or around the cuticle. File the edges of your nails with an emery board or nail file.  Do not cut corns or calluses or try to remove them with medicine.  Wear clean socks or stockings every day. Make sure they are not too tight. Do not wear knee-high stockings since they may decrease blood flow to your legs.  Wear shoes that fit properly and have enough cushioning. To break in new shoes, wear them for just a few hours a day. This prevents you from injuring your feet. Always look in your shoes before you put them on to be sure there are no objects inside.  Do not cross your legs. This may decrease the blood flow to  your feet.  If you find a minor scrape, cut, or break in the skin on your feet, keep it and the skin around it clean and dry. These areas may be cleansed with mild soap and water. Do not cleanse the area with peroxide, alcohol, or iodine.  When you remove an adhesive bandage, be sure not to damage the skin around it.  If you have a wound, look at it several times a day to make sure it is healing.  Do not use heating pads or hot water bottles. They may burn your skin. If you have lost feeling in your feet or legs, you may not know it is happening until it is too late.  Make sure your health care provider performs a complete foot exam at least annually or more often if you have foot problems. Report any cuts, sores, or bruises to your health care provider immediately. SEEK MEDICAL CARE IF:   You have an injury that is not healing.  You have cuts or breaks in the skin.  You have an ingrown nail.  You notice redness on your legs or feet.  You feel burning or tingling in your legs or feet.  You have pain or cramps in your legs and feet.  Your legs or feet are numb.  Your feet always feel cold. SEEK IMMEDIATE MEDICAL CARE IF:  There is increasing redness, swelling, or pain in or around a wound.  There is a red line that goes up your leg.  Pus is coming from a wound.  You develop a fever or as directed by your health care provider.  You notice a bad smell coming from an ulcer or wound. Document Released: 06/18/2000 Document Revised: 02/21/2013 Document Reviewed: 11/28/2012 [facility] Patient Information 2015 [place], [place]. This information is not intended to replace advice given to you by your health care provider. Make sure you discuss any questions you have with your health care provider.

## 2014-05-21 ENCOUNTER — Ambulatory Visit: Payer: Medicare Other

## 2014-05-21 NOTE — Progress Notes (Signed)
Patient ID: [name], male   DOB: [birth year], 80 y.o.   MRN: [id number]  Subjective: This patient presents complaining of painful toenails and keratoses. He is a diabetic who wears diabetic shoes with custom insoles and is requesting replacement diabetic shoes as well. This patient was last evaluated by Dr. Blenda Mounts in 02/12/2014  Objective: Vascular: DP pulses 1/4 bilaterally PT pulses 2/4 bilaterally  Neurological: Sensation to 10 g monofilament wire intact 0/5 right and 3/5 left Vibratory sensation nonreactive bilaterally Ankle reflexes nonreactive bilaterally  Dermatological: The toenails are elongated, hypertrophic, discolored 6-10 Bleeding callus sub-first MPJ right Plantar keratoses fifth MPJ right  Musculoskeletal: Hammertoes 2-4 right  Assessment: Symptomatic onychomycoses 6-10 Pre-ulcerative keratoses plantar right first MPJ Diabetic peripheral neuropathy Hammertoe deformities 2-4 right  Plan: Nails 10 and keratoses 2 debrided without a bleeding  Obtain certification for diabetic shoes for the indication of: Diabetic with neuropathy, Type 2 Loss of vibratory sensation Loss of protective sensation Loss of deep tendon reflexes Hammertoe deformities Diminished dorsalis pedis pulses bilaterally  Reappoint at three-month intervals for skin a nail debridement and return upon certification for replacement diabetic shoes  On custom insoles for diabetic shoes pocket the first and fifth MPJ right

## 2014-05-27 DIAGNOSIS — N183 Chronic kidney disease, stage 3 (moderate): Secondary | ICD-10-CM | POA: Diagnosis not present

## 2014-05-27 DIAGNOSIS — D510 Vitamin B12 deficiency anemia due to intrinsic factor deficiency: Secondary | ICD-10-CM | POA: Diagnosis not present

## 2014-05-27 DIAGNOSIS — D649 Anemia, unspecified: Secondary | ICD-10-CM | POA: Diagnosis not present

## 2014-05-27 DIAGNOSIS — E785 Hyperlipidemia, unspecified: Secondary | ICD-10-CM | POA: Diagnosis not present

## 2014-05-27 DIAGNOSIS — E875 Hyperkalemia: Secondary | ICD-10-CM | POA: Diagnosis not present

## 2014-05-27 DIAGNOSIS — I10 Essential (primary) hypertension: Secondary | ICD-10-CM | POA: Diagnosis not present

## 2014-05-27 DIAGNOSIS — E1129 Type 2 diabetes mellitus with other diabetic kidney complication: Secondary | ICD-10-CM | POA: Diagnosis not present

## 2014-05-27 DIAGNOSIS — M1990 Unspecified osteoarthritis, unspecified site: Secondary | ICD-10-CM | POA: Diagnosis not present

## 2014-05-27 DIAGNOSIS — Z79899 Other long term (current) drug therapy: Secondary | ICD-10-CM | POA: Diagnosis not present

## 2014-06-11 DIAGNOSIS — D631 Anemia in chronic kidney disease: Secondary | ICD-10-CM | POA: Diagnosis not present

## 2014-06-11 DIAGNOSIS — N2581 Secondary hyperparathyroidism of renal origin: Secondary | ICD-10-CM | POA: Diagnosis not present

## 2014-06-11 DIAGNOSIS — I10 Essential (primary) hypertension: Secondary | ICD-10-CM | POA: Diagnosis not present

## 2014-06-11 DIAGNOSIS — N183 Chronic kidney disease, stage 3 (moderate): Secondary | ICD-10-CM | POA: Diagnosis not present

## 2014-06-21 ENCOUNTER — Ambulatory Visit: Payer: Medicare Other

## 2014-06-21 DIAGNOSIS — E1149 Type 2 diabetes mellitus with other diabetic neurological complication: Secondary | ICD-10-CM

## 2014-06-21 NOTE — Progress Notes (Signed)
Pt presents for diabetic shoe measurement

## 2014-07-31 ENCOUNTER — Encounter: Payer: Self-pay | Admitting: Podiatry

## 2014-07-31 ENCOUNTER — Ambulatory Visit (INDEPENDENT_AMBULATORY_CARE_PROVIDER_SITE_OTHER): Payer: Medicare Other | Admitting: Podiatry

## 2014-07-31 VITALS — BP 145/86 | HR 86 | Resp 12

## 2014-07-31 DIAGNOSIS — G629 Polyneuropathy, unspecified: Secondary | ICD-10-CM

## 2014-07-31 DIAGNOSIS — M2040 Other hammer toe(s) (acquired), unspecified foot: Secondary | ICD-10-CM

## 2014-07-31 DIAGNOSIS — B351 Tinea unguium: Secondary | ICD-10-CM

## 2014-07-31 DIAGNOSIS — E1142 Type 2 diabetes mellitus with diabetic polyneuropathy: Secondary | ICD-10-CM

## 2014-07-31 DIAGNOSIS — M79676 Pain in unspecified toe(s): Secondary | ICD-10-CM | POA: Diagnosis not present

## 2014-07-31 DIAGNOSIS — L84 Corns and callosities: Secondary | ICD-10-CM

## 2014-07-31 NOTE — Patient Instructions (Signed)

## 2014-07-31 NOTE — Progress Notes (Signed)
   Subjective:    Patient ID: [name], male    DOB: May 06, 1928, 81 y.o.   MRN: [id number]  HPI  DISPENSED DIABETIC SHOES APEX BLACK SIZE 14 WIDE WITH 3 PAIR CUSTOM MADE INSERTS. Patient is also requesting debridement of nails and keratoses. Last visit for this service was 05/21/2014    Review of Systems     Objective:   Physical Exam  The toenails are elongated, hypertrophic, discolored 6-10 Bleeding callus sub-first right MPJ Hammertoe deformities 2-4 right       Assessment & Plan:   Assessment: Type II diabetic with neuropathy Hammertoe deformities Mycotic toenails 6-10 Pre-ulcerative callus right  satisfactory fit of Apex diabetic shoes 14 wide with 3 pairs of custom inserts   Plan: Debrided toenails 10 and pre-ulcerative callus 1 Dispensed custom inserts and diabetic shoes with wearing instruction provided Wearing instructions provided  Reappoint 3 months for debridement

## 2014-08-12 ENCOUNTER — Ambulatory Visit: Payer: Medicare Other | Admitting: Podiatry

## 2014-11-06 ENCOUNTER — Ambulatory Visit (INDEPENDENT_AMBULATORY_CARE_PROVIDER_SITE_OTHER): Payer: Medicare Other | Admitting: Podiatry

## 2014-11-06 ENCOUNTER — Encounter: Payer: Self-pay | Admitting: Podiatry

## 2014-11-06 VITALS — BP 142/86 | HR 69 | Resp 12

## 2014-11-06 DIAGNOSIS — E1142 Type 2 diabetes mellitus with diabetic polyneuropathy: Secondary | ICD-10-CM | POA: Diagnosis not present

## 2014-11-06 DIAGNOSIS — L84 Corns and callosities: Secondary | ICD-10-CM

## 2014-11-06 DIAGNOSIS — G629 Polyneuropathy, unspecified: Secondary | ICD-10-CM

## 2014-11-06 DIAGNOSIS — M79676 Pain in unspecified toe(s): Secondary | ICD-10-CM | POA: Diagnosis not present

## 2014-11-06 DIAGNOSIS — B351 Tinea unguium: Secondary | ICD-10-CM

## 2014-11-06 NOTE — Patient Instructions (Signed)
Diabetes and Foot Care Diabetes may cause you to have problems because of poor blood supply (circulation) to your feet and legs. This may cause the skin on your feet to become thinner, break easier, and heal more slowly. Your skin may become dry, and the skin may peel and crack. You may also have nerve damage in your legs and feet causing decreased feeling in them. You may not notice minor injuries to your feet that could lead to infections or more serious problems. Taking care of your feet is one of the most important things you can do for yourself.  HOME CARE INSTRUCTIONS  Wear shoes at all times, even in the house. Do not go barefoot. Bare feet are easily injured.  Check your feet daily for blisters, cuts, and redness. If you cannot see the bottom of your feet, use a mirror or ask someone for help.  Wash your feet with warm water (do not use hot water) and mild soap. Then pat your feet and the areas between your toes until they are completely dry. Do not soak your feet as this can dry your skin.  Apply a moisturizing lotion or petroleum jelly (that does not contain alcohol and is unscented) to the skin on your feet and to dry, brittle toenails. Do not apply lotion between your toes.  Trim your toenails straight across. Do not dig under them or around the cuticle. File the edges of your nails with an emery board or nail file.  Do not cut corns or calluses or try to remove them with medicine.  Wear clean socks or stockings every day. Make sure they are not too tight. Do not wear knee-high stockings since they may decrease blood flow to your legs.  Wear shoes that fit properly and have enough cushioning. To break in new shoes, wear them for just a few hours a day. This prevents you from injuring your feet. Always look in your shoes before you put them on to be sure there are no objects inside.  Do not cross your legs. This may decrease the blood flow to your feet.  If you find a minor scrape,  cut, or break in the skin on your feet, keep it and the skin around it clean and dry. These areas may be cleansed with mild soap and water. Do not cleanse the area with peroxide, alcohol, or iodine.  When you remove an adhesive bandage, be sure not to damage the skin around it.  If you have a wound, look at it several times a day to make sure it is healing.  Do not use heating pads or hot water bottles. They may burn your skin. If you have lost feeling in your feet or legs, you may not know it is happening until it is too late.  Make sure your health care provider performs a complete foot exam at least annually or more often if you have foot problems. Report any cuts, sores, or bruises to your health care provider immediately. SEEK MEDICAL CARE IF:   You have an injury that is not healing.  You have cuts or breaks in the skin.  You have an ingrown nail.  You notice redness on your legs or feet.  You feel burning or tingling in your legs or feet.  You have pain or cramps in your legs and feet.  Your legs or feet are numb.  Your feet always feel cold. SEEK IMMEDIATE MEDICAL CARE IF:   There is increasing redness,   swelling, or pain in or around a wound.  There is a red line that goes up your leg.  Pus is coming from a wound.  You develop a fever or as directed by your health care provider.  You notice a bad smell coming from an ulcer or wound. Document Released: 06/18/2000 Document Revised: 02/21/2013 Document Reviewed: 11/28/2012 [employer] Patient Information 2015 [employer]. This information is not intended to replace advice given to you by your health care provider. Make sure you discuss any questions you have with your health care provider.  

## 2014-11-07 NOTE — Progress Notes (Signed)
Patient ID: [name], male   DOB: 09-11-1927, 81 y.o.   MRN: [id number]   Subjective: This patient presents for a scheduled visit for debridement of painful toenails and a pre-ulcerative plantar callus  Objective: The toenails are elongated, brittle, incurvated, hypertrophic and tender to palpation 6-10 Bleeding callus plantar right first MPJ  Assessment: Symptomatic onychomycoses 6-10 Pre-ulcerative plantar callus right Type II diabetic with neuropathy  Plan: Debridement toenails 10 and plantar keratoses 1 without any bleeding  Patient will continue wearing diabetic shoes with custom insoles  Reappoint 3 months

## 2014-11-18 DIAGNOSIS — N390 Urinary tract infection, site not specified: Secondary | ICD-10-CM | POA: Diagnosis not present

## 2014-11-18 DIAGNOSIS — I10 Essential (primary) hypertension: Secondary | ICD-10-CM | POA: Diagnosis not present

## 2014-11-18 DIAGNOSIS — D510 Vitamin B12 deficiency anemia due to intrinsic factor deficiency: Secondary | ICD-10-CM | POA: Diagnosis not present

## 2014-11-18 DIAGNOSIS — E1129 Type 2 diabetes mellitus with other diabetic kidney complication: Secondary | ICD-10-CM | POA: Diagnosis not present

## 2014-11-18 DIAGNOSIS — Z125 Encounter for screening for malignant neoplasm of prostate: Secondary | ICD-10-CM | POA: Diagnosis not present

## 2014-11-18 DIAGNOSIS — R8290 Unspecified abnormal findings in urine: Secondary | ICD-10-CM | POA: Diagnosis not present

## 2014-11-18 DIAGNOSIS — E785 Hyperlipidemia, unspecified: Secondary | ICD-10-CM | POA: Diagnosis not present

## 2014-11-25 DIAGNOSIS — Z6831 Body mass index (BMI) 31.0-31.9, adult: Secondary | ICD-10-CM | POA: Diagnosis not present

## 2014-11-25 DIAGNOSIS — E669 Obesity, unspecified: Secondary | ICD-10-CM | POA: Diagnosis not present

## 2014-11-25 DIAGNOSIS — E11319 Type 2 diabetes mellitus with unspecified diabetic retinopathy without macular edema: Secondary | ICD-10-CM | POA: Diagnosis not present

## 2014-11-25 DIAGNOSIS — R809 Proteinuria, unspecified: Secondary | ICD-10-CM | POA: Diagnosis not present

## 2014-11-25 DIAGNOSIS — M1990 Unspecified osteoarthritis, unspecified site: Secondary | ICD-10-CM | POA: Diagnosis not present

## 2014-11-25 DIAGNOSIS — N183 Chronic kidney disease, stage 3 (moderate): Secondary | ICD-10-CM | POA: Diagnosis not present

## 2014-11-25 DIAGNOSIS — K220 Achalasia of cardia: Secondary | ICD-10-CM | POA: Diagnosis not present

## 2014-11-25 DIAGNOSIS — D649 Anemia, unspecified: Secondary | ICD-10-CM | POA: Diagnosis not present

## 2014-11-25 DIAGNOSIS — R1310 Dysphagia, unspecified: Secondary | ICD-10-CM | POA: Diagnosis not present

## 2014-11-25 DIAGNOSIS — Z1389 Encounter for screening for other disorder: Secondary | ICD-10-CM | POA: Diagnosis not present

## 2014-11-25 DIAGNOSIS — Z1212 Encounter for screening for malignant neoplasm of rectum: Secondary | ICD-10-CM | POA: Diagnosis not present

## 2014-11-25 DIAGNOSIS — Z0000 Encounter for general adult medical examination without abnormal findings: Secondary | ICD-10-CM | POA: Diagnosis not present

## 2015-01-13 DIAGNOSIS — H2512 Age-related nuclear cataract, left eye: Secondary | ICD-10-CM | POA: Diagnosis not present

## 2015-01-13 DIAGNOSIS — E11329 Type 2 diabetes mellitus with mild nonproliferative diabetic retinopathy without macular edema: Secondary | ICD-10-CM | POA: Diagnosis not present

## 2015-01-13 DIAGNOSIS — H35043 Retinal micro-aneurysms, unspecified, bilateral: Secondary | ICD-10-CM | POA: Diagnosis not present

## 2015-01-28 DIAGNOSIS — D631 Anemia in chronic kidney disease: Secondary | ICD-10-CM | POA: Diagnosis not present

## 2015-01-28 DIAGNOSIS — I10 Essential (primary) hypertension: Secondary | ICD-10-CM | POA: Diagnosis not present

## 2015-01-28 DIAGNOSIS — N2581 Secondary hyperparathyroidism of renal origin: Secondary | ICD-10-CM | POA: Diagnosis not present

## 2015-01-28 DIAGNOSIS — N183 Chronic kidney disease, stage 3 (moderate): Secondary | ICD-10-CM | POA: Diagnosis not present

## 2015-02-05 ENCOUNTER — Ambulatory Visit (INDEPENDENT_AMBULATORY_CARE_PROVIDER_SITE_OTHER): Payer: Medicare Other | Admitting: Podiatry

## 2015-02-05 ENCOUNTER — Encounter: Payer: Self-pay | Admitting: Podiatry

## 2015-02-05 DIAGNOSIS — L84 Corns and callosities: Secondary | ICD-10-CM | POA: Diagnosis not present

## 2015-02-05 DIAGNOSIS — E1142 Type 2 diabetes mellitus with diabetic polyneuropathy: Secondary | ICD-10-CM | POA: Diagnosis not present

## 2015-02-05 DIAGNOSIS — M79676 Pain in unspecified toe(s): Secondary | ICD-10-CM

## 2015-02-05 DIAGNOSIS — B351 Tinea unguium: Secondary | ICD-10-CM

## 2015-02-05 DIAGNOSIS — G629 Polyneuropathy, unspecified: Secondary | ICD-10-CM

## 2015-02-05 NOTE — Patient Instructions (Signed)
Diabetes and Foot Care Diabetes may cause you to have problems because of poor blood supply (circulation) to your feet and legs. This may cause the skin on your feet to become thinner, break easier, and heal more slowly. Your skin may become dry, and the skin may peel and crack. You may also have nerve damage in your legs and feet causing decreased feeling in them. You may not notice minor injuries to your feet that could lead to infections or more serious problems. Taking care of your feet is one of the most important things you can do for yourself.  HOME CARE INSTRUCTIONS  Wear shoes at all times, even in the house. Do not go barefoot. Bare feet are easily injured.  Check your feet daily for blisters, cuts, and redness. If you cannot see the bottom of your feet, use a mirror or ask someone for help.  Wash your feet with warm water (do not use hot water) and mild soap. Then pat your feet and the areas between your toes until they are completely dry. Do not soak your feet as this can dry your skin.  Apply a moisturizing lotion or petroleum jelly (that does not contain alcohol and is unscented) to the skin on your feet and to dry, brittle toenails. Do not apply lotion between your toes.  Trim your toenails straight across. Do not dig under them or around the cuticle. File the edges of your nails with an emery board or nail file.  Do not cut corns or calluses or try to remove them with medicine.  Wear clean socks or stockings every day. Make sure they are not too tight. Do not wear knee-high stockings since they may decrease blood flow to your legs.  Wear shoes that fit properly and have enough cushioning. To break in new shoes, wear them for just a few hours a day. This prevents you from injuring your feet. Always look in your shoes before you put them on to be sure there are no objects inside.  Do not cross your legs. This may decrease the blood flow to your feet.  If you find a minor scrape,  cut, or break in the skin on your feet, keep it and the skin around it clean and dry. These areas may be cleansed with mild soap and water. Do not cleanse the area with peroxide, alcohol, or iodine.  When you remove an adhesive bandage, be sure not to damage the skin around it.  If you have a wound, look at it several times a day to make sure it is healing.  Do not use heating pads or hot water bottles. They may burn your skin. If you have lost feeling in your feet or legs, you may not know it is happening until it is too late.  Make sure your health care provider performs a complete foot exam at least annually or more often if you have foot problems. Report any cuts, sores, or bruises to your health care provider immediately. SEEK MEDICAL CARE IF:   You have an injury that is not healing.  You have cuts or breaks in the skin.  You have an ingrown nail.  You notice redness on your legs or feet.  You feel burning or tingling in your legs or feet.  You have pain or cramps in your legs and feet.  Your legs or feet are numb.  Your feet always feel cold. SEEK IMMEDIATE MEDICAL CARE IF:   There is increasing redness,   swelling, or pain in or around a wound.  There is a red line that goes up your leg.  Pus is coming from a wound.  You develop a fever or as directed by your health care provider.  You notice a bad smell coming from an ulcer or wound. Document Released: 06/18/2000 Document Revised: 02/21/2013 Document Reviewed: 11/28/2012 [employer] Patient Information 2015 [employer]. This information is not intended to replace advice given to you by your health care provider. Make sure you discuss any questions you have with your health care provider.  

## 2015-02-06 NOTE — Progress Notes (Signed)
Patient ID: [name], male   DOB: 12/10/1927, 81 y.o.   MRN: [id number]  Subjective: This patient presents for a scheduled visit complaining of painful toenails and a pre-ulcerative plantar callus. He has schedule or debridement of toenails and a pre-ulcerative callus  Objective: The toenails are brittle, hypertrophic, discolored, incurvated and tender to direct palpation 6-10 Bleeding plantar callus sub-first right MPJ without any surrounding erythema, edema or drainage  Assessment: Symptomatic onychomycoses 6-10 Pre-ulcerative plantar callus right Type II diabetic with neuropathy  Plan: Debridement of toenails 10 and mechanically and electrically without any bleeding Debrided pre-ulcerative plantar callus right without a bleeding Attach an additional felt pad to patient's existing insole to further offload the plantar right first MPJ  Reappoint 3 months

## 2015-03-12 DIAGNOSIS — Z23 Encounter for immunization: Secondary | ICD-10-CM | POA: Diagnosis not present

## 2015-03-26 DIAGNOSIS — L820 Inflamed seborrheic keratosis: Secondary | ICD-10-CM | POA: Diagnosis not present

## 2015-03-26 DIAGNOSIS — C44319 Basal cell carcinoma of skin of other parts of face: Secondary | ICD-10-CM | POA: Diagnosis not present

## 2015-03-26 DIAGNOSIS — L720 Epidermal cyst: Secondary | ICD-10-CM | POA: Diagnosis not present

## 2015-03-26 DIAGNOSIS — Z85828 Personal history of other malignant neoplasm of skin: Secondary | ICD-10-CM | POA: Diagnosis not present

## 2015-03-26 DIAGNOSIS — L821 Other seborrheic keratosis: Secondary | ICD-10-CM | POA: Diagnosis not present

## 2015-03-26 DIAGNOSIS — D045 Carcinoma in situ of skin of trunk: Secondary | ICD-10-CM | POA: Diagnosis not present

## 2015-03-26 DIAGNOSIS — D485 Neoplasm of uncertain behavior of skin: Secondary | ICD-10-CM | POA: Diagnosis not present

## 2015-03-26 DIAGNOSIS — D0439 Carcinoma in situ of skin of other parts of face: Secondary | ICD-10-CM | POA: Diagnosis not present

## 2015-05-13 ENCOUNTER — Ambulatory Visit: Payer: Medicare Other | Admitting: Podiatry

## 2015-05-20 ENCOUNTER — Ambulatory Visit (INDEPENDENT_AMBULATORY_CARE_PROVIDER_SITE_OTHER): Payer: Medicare Other | Admitting: Podiatry

## 2015-05-20 ENCOUNTER — Encounter: Payer: Self-pay | Admitting: Podiatry

## 2015-05-20 DIAGNOSIS — M79675 Pain in left toe(s): Secondary | ICD-10-CM | POA: Diagnosis not present

## 2015-05-20 DIAGNOSIS — M79674 Pain in right toe(s): Secondary | ICD-10-CM | POA: Diagnosis not present

## 2015-05-20 DIAGNOSIS — B351 Tinea unguium: Secondary | ICD-10-CM

## 2015-05-20 DIAGNOSIS — L84 Corns and callosities: Secondary | ICD-10-CM | POA: Diagnosis not present

## 2015-05-20 DIAGNOSIS — E1142 Type 2 diabetes mellitus with diabetic polyneuropathy: Secondary | ICD-10-CM

## 2015-05-20 NOTE — Patient Instructions (Signed)
Diabetes and Foot Care Diabetes may cause you to have problems because of poor blood supply (circulation) to your feet and legs. This may cause the skin on your feet to become thinner, break easier, and heal more slowly. Your skin may become dry, and the skin may peel and crack. You may also have nerve damage in your legs and feet causing decreased feeling in them. You may not notice minor injuries to your feet that could lead to infections or more serious problems. Taking care of your feet is one of the most important things you can do for yourself.  HOME CARE INSTRUCTIONS  Wear shoes at all times, even in the house. Do not go barefoot. Bare feet are easily injured.  Check your feet daily for blisters, cuts, and redness. If you cannot see the bottom of your feet, use a mirror or ask someone for help.  Wash your feet with warm water (do not use hot water) and mild soap. Then pat your feet and the areas between your toes until they are completely dry. Do not soak your feet as this can dry your skin.  Apply a moisturizing lotion or petroleum jelly (that does not contain alcohol and is unscented) to the skin on your feet and to dry, brittle toenails. Do not apply lotion between your toes.  Trim your toenails straight across. Do not dig under them or around the cuticle. File the edges of your nails with an emery board or nail file.  Do not cut corns or calluses or try to remove them with medicine.  Wear clean socks or stockings every day. Make sure they are not too tight. Do not wear knee-high stockings since they may decrease blood flow to your legs.  Wear shoes that fit properly and have enough cushioning. To break in new shoes, wear them for just a few hours a day. This prevents you from injuring your feet. Always look in your shoes before you put them on to be sure there are no objects inside.  Do not cross your legs. This may decrease the blood flow to your feet.  If you find a minor scrape,  cut, or break in the skin on your feet, keep it and the skin around it clean and dry. These areas may be cleansed with mild soap and water. Do not cleanse the area with peroxide, alcohol, or iodine.  When you remove an adhesive bandage, be sure not to damage the skin around it.  If you have a wound, look at it several times a day to make sure it is healing.  Do not use heating pads or hot water bottles. They may burn your skin. If you have lost feeling in your feet or legs, you may not know it is happening until it is too late.  Make sure your health care provider performs a complete foot exam at least annually or more often if you have foot problems. Report any cuts, sores, or bruises to your health care provider immediately. SEEK MEDICAL CARE IF:   You have an injury that is not healing.  You have cuts or breaks in the skin.  You have an ingrown nail.  You notice redness on your legs or feet.  You feel burning or tingling in your legs or feet.  You have pain or cramps in your legs and feet.  Your legs or feet are numb.  Your feet always feel cold. SEEK IMMEDIATE MEDICAL CARE IF:   There is increasing redness,   swelling, or pain in or around a wound.  There is a red line that goes up your leg.  Pus is coming from a wound.  You develop a fever or as directed by your health care provider.  You notice a bad smell coming from an ulcer or wound.   This information is not intended to replace advice given to you by your health care provider. Make sure you discuss any questions you have with your health care provider.   Document Released: 06/18/2000 Document Revised: 02/21/2013 Document Reviewed: 11/28/2012 Elsevier Interactive Patient Education 2016 [employer]  

## 2015-05-20 NOTE — Progress Notes (Signed)
Patient ID: [name], male   DOB: 05/14/1928, 81 y.o.   MRN: [id number]   Subjective: As patient presents for scheduled visit complaining of painful toenails and walking wearing shoes as well as a plantar callus on the right foot  Objective: Orientated 3 No open skin lesions bilaterally The toenails are elongated, hypertrophic, discolored, incurvated and tender to direct palpation 6-10 Bleeding plantar callus sub-first right MPJ  Assessment: Symptomatic onychomycoses 6-10 Pre-ulcerative plantar callus right first MPJ Type II diabetic with neuropathy  Plan: Debridement toenails 10 mechanically and electrically without any bleeding Debrided plantar callus right first MPJ without any bleeding  Continue to wear diabetic shoes with additional felt pads to offload the plantar right first MPJ  Reappoint 3 months

## 2015-06-16 DIAGNOSIS — R808 Other proteinuria: Secondary | ICD-10-CM | POA: Diagnosis not present

## 2015-06-16 DIAGNOSIS — C4492 Squamous cell carcinoma of skin, unspecified: Secondary | ICD-10-CM | POA: Diagnosis not present

## 2015-06-16 DIAGNOSIS — Z1389 Encounter for screening for other disorder: Secondary | ICD-10-CM | POA: Diagnosis not present

## 2015-06-16 DIAGNOSIS — D510 Vitamin B12 deficiency anemia due to intrinsic factor deficiency: Secondary | ICD-10-CM | POA: Diagnosis not present

## 2015-06-16 DIAGNOSIS — I129 Hypertensive chronic kidney disease with stage 1 through stage 4 chronic kidney disease, or unspecified chronic kidney disease: Secondary | ICD-10-CM | POA: Diagnosis not present

## 2015-06-16 DIAGNOSIS — E1129 Type 2 diabetes mellitus with other diabetic kidney complication: Secondary | ICD-10-CM | POA: Diagnosis not present

## 2015-06-16 DIAGNOSIS — Z6830 Body mass index (BMI) 30.0-30.9, adult: Secondary | ICD-10-CM | POA: Diagnosis not present

## 2015-06-16 DIAGNOSIS — E7800 Pure hypercholesterolemia, unspecified: Secondary | ICD-10-CM | POA: Diagnosis not present

## 2015-06-16 DIAGNOSIS — E113599 Type 2 diabetes mellitus with proliferative diabetic retinopathy without macular edema, unspecified eye: Secondary | ICD-10-CM | POA: Diagnosis not present

## 2015-06-16 DIAGNOSIS — D6489 Other specified anemias: Secondary | ICD-10-CM | POA: Diagnosis not present

## 2015-06-16 DIAGNOSIS — D649 Anemia, unspecified: Secondary | ICD-10-CM | POA: Diagnosis not present

## 2015-06-16 DIAGNOSIS — I10 Essential (primary) hypertension: Secondary | ICD-10-CM | POA: Diagnosis not present

## 2015-06-16 DIAGNOSIS — R197 Diarrhea, unspecified: Secondary | ICD-10-CM | POA: Diagnosis not present

## 2015-06-16 DIAGNOSIS — N183 Chronic kidney disease, stage 3 (moderate): Secondary | ICD-10-CM | POA: Diagnosis not present

## 2015-06-23 DIAGNOSIS — I129 Hypertensive chronic kidney disease with stage 1 through stage 4 chronic kidney disease, or unspecified chronic kidney disease: Secondary | ICD-10-CM | POA: Diagnosis not present

## 2015-08-06 DIAGNOSIS — N2581 Secondary hyperparathyroidism of renal origin: Secondary | ICD-10-CM | POA: Diagnosis not present

## 2015-08-06 DIAGNOSIS — N183 Chronic kidney disease, stage 3 (moderate): Secondary | ICD-10-CM | POA: Diagnosis not present

## 2015-08-06 DIAGNOSIS — N189 Chronic kidney disease, unspecified: Secondary | ICD-10-CM | POA: Diagnosis not present

## 2015-08-12 DIAGNOSIS — N2581 Secondary hyperparathyroidism of renal origin: Secondary | ICD-10-CM | POA: Diagnosis not present

## 2015-08-12 DIAGNOSIS — D631 Anemia in chronic kidney disease: Secondary | ICD-10-CM | POA: Diagnosis not present

## 2015-08-12 DIAGNOSIS — N183 Chronic kidney disease, stage 3 (moderate): Secondary | ICD-10-CM | POA: Diagnosis not present

## 2015-08-12 DIAGNOSIS — I10 Essential (primary) hypertension: Secondary | ICD-10-CM | POA: Diagnosis not present

## 2015-08-19 DIAGNOSIS — N183 Chronic kidney disease, stage 3 (moderate): Secondary | ICD-10-CM | POA: Diagnosis not present

## 2015-08-20 ENCOUNTER — Ambulatory Visit (INDEPENDENT_AMBULATORY_CARE_PROVIDER_SITE_OTHER): Payer: Medicare Other | Admitting: Podiatry

## 2015-08-20 ENCOUNTER — Encounter: Payer: Self-pay | Admitting: Podiatry

## 2015-08-20 DIAGNOSIS — M79675 Pain in left toe(s): Secondary | ICD-10-CM | POA: Diagnosis not present

## 2015-08-20 DIAGNOSIS — L84 Corns and callosities: Secondary | ICD-10-CM | POA: Diagnosis not present

## 2015-08-20 DIAGNOSIS — M79674 Pain in right toe(s): Secondary | ICD-10-CM | POA: Diagnosis not present

## 2015-08-20 DIAGNOSIS — B351 Tinea unguium: Secondary | ICD-10-CM | POA: Diagnosis not present

## 2015-08-20 DIAGNOSIS — E1142 Type 2 diabetes mellitus with diabetic polyneuropathy: Secondary | ICD-10-CM | POA: Diagnosis not present

## 2015-08-20 NOTE — Progress Notes (Signed)
Patient ID: [name], male   DOB: 1927/08/23, 82 y.o.   MRN: [id number]   Subjective: As patient presents for scheduled visit complaining of painful toenails and walking wearing shoes as well as a plantar callus on the right foot  Objective: Orientated 3 No open skin lesions bilaterally The toenails are elongated, hypertrophic, discolored, incurvated and tender to direct palpation 6-10 Bleeding plantar callus sub-first right MPJ  Assessment: Symptomatic onychomycoses 6-10 Pre-ulcerative plantar callus right first MPJ Type II diabetic with neuropathy  Plan: Debridement toenails 10 mechanically and electrically without any bleeding Debrided plantar callus right first MPJ without any bleeding  Continue to wear diabetic shoes with additional felt pads to offload the plantar right first MPJ  Reappoint 3 months

## 2015-08-20 NOTE — Patient Instructions (Signed)
Diabetes and Foot Care Diabetes may cause you to have problems because of poor blood supply (circulation) to your feet and legs. This may cause the skin on your feet to become thinner, break easier, and heal more slowly. Your skin may become dry, and the skin may peel and crack. You may also have nerve damage in your legs and feet causing decreased feeling in them. You may not notice minor injuries to your feet that could lead to infections or more serious problems. Taking care of your feet is one of the most important things you can do for yourself.  HOME CARE INSTRUCTIONS  Wear shoes at all times, even in the house. Do not go barefoot. Bare feet are easily injured.  Check your feet daily for blisters, cuts, and redness. If you cannot see the bottom of your feet, use a mirror or ask someone for help.  Wash your feet with warm water (do not use hot water) and mild soap. Then pat your feet and the areas between your toes until they are completely dry. Do not soak your feet as this can dry your skin.  Apply a moisturizing lotion or petroleum jelly (that does not contain alcohol and is unscented) to the skin on your feet and to dry, brittle toenails. Do not apply lotion between your toes.  Trim your toenails straight across. Do not dig under them or around the cuticle. File the edges of your nails with an emery board or nail file.  Do not cut corns or calluses or try to remove them with medicine.  Wear clean socks or stockings every day. Make sure they are not too tight. Do not wear knee-high stockings since they may decrease blood flow to your legs.  Wear shoes that fit properly and have enough cushioning. To break in new shoes, wear them for just a few hours a day. This prevents you from injuring your feet. Always look in your shoes before you put them on to be sure there are no objects inside.  Do not cross your legs. This may decrease the blood flow to your feet.  If you find a minor scrape,  cut, or break in the skin on your feet, keep it and the skin around it clean and dry. These areas may be cleansed with mild soap and water. Do not cleanse the area with peroxide, alcohol, or iodine.  When you remove an adhesive bandage, be sure not to damage the skin around it.  If you have a wound, look at it several times a day to make sure it is healing.  Do not use heating pads or hot water bottles. They may burn your skin. If you have lost feeling in your feet or legs, you may not know it is happening until it is too late.  Make sure your health care provider performs a complete foot exam at least annually or more often if you have foot problems. Report any cuts, sores, or bruises to your health care provider immediately. SEEK MEDICAL CARE IF:   You have an injury that is not healing.  You have cuts or breaks in the skin.  You have an ingrown nail.  You notice redness on your legs or feet.  You feel burning or tingling in your legs or feet.  You have pain or cramps in your legs and feet.  Your legs or feet are numb.  Your feet always feel cold. SEEK IMMEDIATE MEDICAL CARE IF:   There is increasing redness,   swelling, or pain in or around a wound.  There is a red line that goes up your leg.  Pus is coming from a wound.  You develop a fever or as directed by your health care provider.  You notice a bad smell coming from an ulcer or wound.   This information is not intended to replace advice given to you by your health care provider. Make sure you discuss any questions you have with your health care provider.   Document Released: 06/18/2000 Document Revised: 02/21/2013 Document Reviewed: 11/28/2012 Elsevier Interactive Patient Education 2016 [employer]  

## 2015-10-13 DIAGNOSIS — H35043 Retinal micro-aneurysms, unspecified, bilateral: Secondary | ICD-10-CM | POA: Diagnosis not present

## 2015-10-13 DIAGNOSIS — E113293 Type 2 diabetes mellitus with mild nonproliferative diabetic retinopathy without macular edema, bilateral: Secondary | ICD-10-CM | POA: Diagnosis not present

## 2015-10-13 DIAGNOSIS — H25012 Cortical age-related cataract, left eye: Secondary | ICD-10-CM | POA: Diagnosis not present

## 2015-10-13 DIAGNOSIS — H2512 Age-related nuclear cataract, left eye: Secondary | ICD-10-CM | POA: Diagnosis not present

## 2015-11-19 ENCOUNTER — Ambulatory Visit (INDEPENDENT_AMBULATORY_CARE_PROVIDER_SITE_OTHER): Payer: Medicare Other | Admitting: Podiatry

## 2015-11-19 ENCOUNTER — Encounter: Payer: Self-pay | Admitting: Podiatry

## 2015-11-19 DIAGNOSIS — L84 Corns and callosities: Secondary | ICD-10-CM | POA: Diagnosis not present

## 2015-11-19 DIAGNOSIS — B351 Tinea unguium: Secondary | ICD-10-CM | POA: Diagnosis not present

## 2015-11-19 DIAGNOSIS — M79674 Pain in right toe(s): Secondary | ICD-10-CM

## 2015-11-19 DIAGNOSIS — E1142 Type 2 diabetes mellitus with diabetic polyneuropathy: Secondary | ICD-10-CM | POA: Diagnosis not present

## 2015-11-19 DIAGNOSIS — M79675 Pain in left toe(s): Secondary | ICD-10-CM

## 2015-11-19 NOTE — Patient Instructions (Signed)
Diabetes and Foot Care Diabetes may cause you to have problems because of poor blood supply (circulation) to your feet and legs. This may cause the skin on your feet to become thinner, break easier, and heal more slowly. Your skin may become dry, and the skin may peel and crack. You may also have nerve damage in your legs and feet causing decreased feeling in them. You may not notice minor injuries to your feet that could lead to infections or more serious problems. Taking care of your feet is one of the most important things you can do for yourself.  HOME CARE INSTRUCTIONS  Wear shoes at all times, even in the house. Do not go barefoot. Bare feet are easily injured.  Check your feet daily for blisters, cuts, and redness. If you cannot see the bottom of your feet, use a mirror or ask someone for help.  Wash your feet with warm water (do not use hot water) and mild soap. Then pat your feet and the areas between your toes until they are completely dry. Do not soak your feet as this can dry your skin.  Apply a moisturizing lotion or petroleum jelly (that does not contain alcohol and is unscented) to the skin on your feet and to dry, brittle toenails. Do not apply lotion between your toes.  Trim your toenails straight across. Do not dig under them or around the cuticle. File the edges of your nails with an emery board or nail file.  Do not cut corns or calluses or try to remove them with medicine.  Wear clean socks or stockings every day. Make sure they are not too tight. Do not wear knee-high stockings since they may decrease blood flow to your legs.  Wear shoes that fit properly and have enough cushioning. To break in new shoes, wear them for just a few hours a day. This prevents you from injuring your feet. Always look in your shoes before you put them on to be sure there are no objects inside.  Do not cross your legs. This may decrease the blood flow to your feet.  If you find a minor scrape,  cut, or break in the skin on your feet, keep it and the skin around it clean and dry. These areas may be cleansed with mild soap and water. Do not cleanse the area with peroxide, alcohol, or iodine.  When you remove an adhesive bandage, be sure not to damage the skin around it.  If you have a wound, look at it several times a day to make sure it is healing.  Do not use heating pads or hot water bottles. They may burn your skin. If you have lost feeling in your feet or legs, you may not know it is happening until it is too late.  Make sure your health care provider performs a complete foot exam at least annually or more often if you have foot problems. Report any cuts, sores, or bruises to your health care provider immediately. SEEK MEDICAL CARE IF:   You have an injury that is not healing.  You have cuts or breaks in the skin.  You have an ingrown nail.  You notice redness on your legs or feet.  You feel burning or tingling in your legs or feet.  You have pain or cramps in your legs and feet.  Your legs or feet are numb.  Your feet always feel cold. SEEK IMMEDIATE MEDICAL CARE IF:   There is increasing redness,   swelling, or pain in or around a wound.  There is a red line that goes up your leg.  Pus is coming from a wound.  You develop a fever or as directed by your health care provider.  You notice a bad smell coming from an ulcer or wound.   This information is not intended to replace advice given to you by your health care provider. Make sure you discuss any questions you have with your health care provider.   Document Released: 06/18/2000 Document Revised: 02/21/2013 Document Reviewed: 11/28/2012 Elsevier Interactive Patient Education 2016 [employer]  

## 2015-11-19 NOTE — Progress Notes (Signed)
Patient ID: [name], male   DOB: [birth year], 82 y.o.   MRN: [id number]   Expand All Collapse All   Patient ID: [name], male DOB: 20-May-1928, 82 y.o. MRN: [id number]   Subjective: As patient presents for scheduled visit complaining of painful toenails and walking wearing shoes as well as a plantar callus on the right foot  Objective: Orientated 3 No open skin lesions bilaterally The toenails are elongated, hypertrophic, discolored, incurvated and tender to direct palpation 6-10 Bleeding plantar callus sub-first right MPJ  Assessment: Symptomatic onychomycoses 6-10 Pre-ulcerative plantar callus right first MPJ Type II diabetic with neuropathy  Plan: Debridement toenails 10 mechanically and electrically without any bleeding Debrided plantar callus right first MPJ without any bleeding  Continue to wear diabetic shoes with additional felt pads to offload the plantar right first MPJ  Reappoint 3 months

## 2015-11-24 DIAGNOSIS — I10 Essential (primary) hypertension: Secondary | ICD-10-CM | POA: Diagnosis not present

## 2015-11-24 DIAGNOSIS — R8299 Other abnormal findings in urine: Secondary | ICD-10-CM | POA: Diagnosis not present

## 2015-11-24 DIAGNOSIS — E1129 Type 2 diabetes mellitus with other diabetic kidney complication: Secondary | ICD-10-CM | POA: Diagnosis not present

## 2015-11-24 DIAGNOSIS — N390 Urinary tract infection, site not specified: Secondary | ICD-10-CM | POA: Diagnosis not present

## 2015-11-24 DIAGNOSIS — D510 Vitamin B12 deficiency anemia due to intrinsic factor deficiency: Secondary | ICD-10-CM | POA: Diagnosis not present

## 2015-12-08 DIAGNOSIS — Z0000 Encounter for general adult medical examination without abnormal findings: Secondary | ICD-10-CM | POA: Diagnosis not present

## 2015-12-08 DIAGNOSIS — D631 Anemia in chronic kidney disease: Secondary | ICD-10-CM | POA: Diagnosis not present

## 2015-12-08 DIAGNOSIS — Z6831 Body mass index (BMI) 31.0-31.9, adult: Secondary | ICD-10-CM | POA: Diagnosis not present

## 2015-12-08 DIAGNOSIS — K220 Achalasia of cardia: Secondary | ICD-10-CM | POA: Diagnosis not present

## 2015-12-08 DIAGNOSIS — N183 Chronic kidney disease, stage 3 (moderate): Secondary | ICD-10-CM | POA: Diagnosis not present

## 2015-12-08 DIAGNOSIS — I129 Hypertensive chronic kidney disease with stage 1 through stage 4 chronic kidney disease, or unspecified chronic kidney disease: Secondary | ICD-10-CM | POA: Diagnosis not present

## 2015-12-08 DIAGNOSIS — E113592 Type 2 diabetes mellitus with proliferative diabetic retinopathy without macular edema, left eye: Secondary | ICD-10-CM | POA: Diagnosis not present

## 2015-12-08 DIAGNOSIS — E11319 Type 2 diabetes mellitus with unspecified diabetic retinopathy without macular edema: Secondary | ICD-10-CM | POA: Diagnosis not present

## 2015-12-08 DIAGNOSIS — D692 Other nonthrombocytopenic purpura: Secondary | ICD-10-CM | POA: Diagnosis not present

## 2015-12-08 DIAGNOSIS — I10 Essential (primary) hypertension: Secondary | ICD-10-CM | POA: Diagnosis not present

## 2015-12-08 DIAGNOSIS — E7800 Pure hypercholesterolemia, unspecified: Secondary | ICD-10-CM | POA: Diagnosis not present

## 2015-12-12 DIAGNOSIS — I10 Essential (primary) hypertension: Secondary | ICD-10-CM | POA: Diagnosis not present

## 2015-12-12 DIAGNOSIS — D631 Anemia in chronic kidney disease: Secondary | ICD-10-CM | POA: Diagnosis not present

## 2015-12-12 DIAGNOSIS — N2581 Secondary hyperparathyroidism of renal origin: Secondary | ICD-10-CM | POA: Diagnosis not present

## 2015-12-12 DIAGNOSIS — N183 Chronic kidney disease, stage 3 (moderate): Secondary | ICD-10-CM | POA: Diagnosis not present

## 2015-12-22 DIAGNOSIS — K220 Achalasia of cardia: Secondary | ICD-10-CM | POA: Diagnosis not present

## 2015-12-23 DIAGNOSIS — K222 Esophageal obstruction: Secondary | ICD-10-CM | POA: Diagnosis not present

## 2015-12-23 DIAGNOSIS — K228 Other specified diseases of esophagus: Secondary | ICD-10-CM | POA: Diagnosis not present

## 2015-12-23 DIAGNOSIS — R1310 Dysphagia, unspecified: Secondary | ICD-10-CM | POA: Diagnosis not present

## 2015-12-23 DIAGNOSIS — K220 Achalasia of cardia: Secondary | ICD-10-CM | POA: Diagnosis not present

## 2015-12-23 DIAGNOSIS — T18128A Food in esophagus causing other injury, initial encounter: Secondary | ICD-10-CM | POA: Diagnosis not present

## 2015-12-25 DIAGNOSIS — R1310 Dysphagia, unspecified: Secondary | ICD-10-CM | POA: Diagnosis not present

## 2015-12-25 DIAGNOSIS — K220 Achalasia of cardia: Secondary | ICD-10-CM | POA: Diagnosis not present

## 2016-02-18 ENCOUNTER — Ambulatory Visit (INDEPENDENT_AMBULATORY_CARE_PROVIDER_SITE_OTHER): Payer: Medicare Other | Admitting: Podiatry

## 2016-02-18 ENCOUNTER — Encounter: Payer: Self-pay | Admitting: Podiatry

## 2016-02-18 DIAGNOSIS — M79675 Pain in left toe(s): Secondary | ICD-10-CM | POA: Diagnosis not present

## 2016-02-18 DIAGNOSIS — L84 Corns and callosities: Secondary | ICD-10-CM | POA: Diagnosis not present

## 2016-02-18 DIAGNOSIS — B351 Tinea unguium: Secondary | ICD-10-CM | POA: Diagnosis not present

## 2016-02-18 DIAGNOSIS — M79674 Pain in right toe(s): Secondary | ICD-10-CM | POA: Diagnosis not present

## 2016-02-18 DIAGNOSIS — E1142 Type 2 diabetes mellitus with diabetic polyneuropathy: Secondary | ICD-10-CM | POA: Diagnosis not present

## 2016-02-18 NOTE — Progress Notes (Signed)
Patient ID: [name], male   DOB: 12-Nov-1927, 82 y.o.   MRN: [id number]    Subjective: As patient presents for scheduled visit complaining of painful toenails and walking wearing shoes as well as a plantar callus on the right foot  Objective: Orientated 3 No open skin lesions bilaterally The toenails are elongated, hypertrophic, discolored, incurvated and tender to direct palpation 6-10 Bleeding plantar callus sub-first right MPJ  Assessment: Symptomatic onychomycoses 6-10 Pre-ulcerative plantar callus right first MPJ Type II diabetic with neuropathy  Plan: Debridement toenails 10 mechanically and electrically without any bleeding Debrided plantar callus right first MPJ without any bleeding  Continue to wear diabetic shoes with additional felt pads to offload the plantar right first MPJ  Reappoint 3 months

## 2016-02-18 NOTE — Patient Instructions (Signed)
Diabetes and Foot Care Diabetes may cause you to have problems because of poor blood supply (circulation) to your feet and legs. This may cause the skin on your feet to become thinner, break easier, and heal more slowly. Your skin may become dry, and the skin may peel and crack. You may also have nerve damage in your legs and feet causing decreased feeling in them. You may not notice minor injuries to your feet that could lead to infections or more serious problems. Taking care of your feet is one of the most important things you can do for yourself.  HOME CARE INSTRUCTIONS  Wear shoes at all times, even in the house. Do not go barefoot. Bare feet are easily injured.  Check your feet daily for blisters, cuts, and redness. If you cannot see the bottom of your feet, use a mirror or ask someone for help.  Wash your feet with warm water (do not use hot water) and mild soap. Then pat your feet and the areas between your toes until they are completely dry. Do not soak your feet as this can dry your skin.  Apply a moisturizing lotion or petroleum jelly (that does not contain alcohol and is unscented) to the skin on your feet and to dry, brittle toenails. Do not apply lotion between your toes.  Trim your toenails straight across. Do not dig under them or around the cuticle. File the edges of your nails with an emery board or nail file.  Do not cut corns or calluses or try to remove them with medicine.  Wear clean socks or stockings every day. Make sure they are not too tight. Do not wear knee-high stockings since they may decrease blood flow to your legs.  Wear shoes that fit properly and have enough cushioning. To break in new shoes, wear them for just a few hours a day. This prevents you from injuring your feet. Always look in your shoes before you put them on to be sure there are no objects inside.  Do not cross your legs. This may decrease the blood flow to your feet.  If you find a minor scrape,  cut, or break in the skin on your feet, keep it and the skin around it clean and dry. These areas may be cleansed with mild soap and water. Do not cleanse the area with peroxide, alcohol, or iodine.  When you remove an adhesive bandage, be sure not to damage the skin around it.  If you have a wound, look at it several times a day to make sure it is healing.  Do not use heating pads or hot water bottles. They may burn your skin. If you have lost feeling in your feet or legs, you may not know it is happening until it is too late.  Make sure your health care provider performs a complete foot exam at least annually or more often if you have foot problems. Report any cuts, sores, or bruises to your health care provider immediately. SEEK MEDICAL CARE IF:   You have an injury that is not healing.  You have cuts or breaks in the skin.  You have an ingrown nail.  You notice redness on your legs or feet.  You feel burning or tingling in your legs or feet.  You have pain or cramps in your legs and feet.  Your legs or feet are numb.  Your feet always feel cold. SEEK IMMEDIATE MEDICAL CARE IF:   There is increasing redness,   swelling, or pain in or around a wound.  There is a red line that goes up your leg.  Pus is coming from a wound.  You develop a fever or as directed by your health care provider.  You notice a bad smell coming from an ulcer or wound.   This information is not intended to replace advice given to you by your health care provider. Make sure you discuss any questions you have with your health care provider.   Document Released: 06/18/2000 Document Revised: 02/21/2013 Document Reviewed: 11/28/2012 Elsevier Interactive Patient Education 2016 [employer]  

## 2016-03-25 DIAGNOSIS — L821 Other seborrheic keratosis: Secondary | ICD-10-CM | POA: Diagnosis not present

## 2016-03-25 DIAGNOSIS — D0439 Carcinoma in situ of skin of other parts of face: Secondary | ICD-10-CM | POA: Diagnosis not present

## 2016-03-25 DIAGNOSIS — D0461 Carcinoma in situ of skin of right upper limb, including shoulder: Secondary | ICD-10-CM | POA: Diagnosis not present

## 2016-03-25 DIAGNOSIS — Z85828 Personal history of other malignant neoplasm of skin: Secondary | ICD-10-CM | POA: Diagnosis not present

## 2016-03-25 DIAGNOSIS — C44311 Basal cell carcinoma of skin of nose: Secondary | ICD-10-CM | POA: Diagnosis not present

## 2016-03-25 DIAGNOSIS — D485 Neoplasm of uncertain behavior of skin: Secondary | ICD-10-CM | POA: Diagnosis not present

## 2016-03-25 DIAGNOSIS — L570 Actinic keratosis: Secondary | ICD-10-CM | POA: Diagnosis not present

## 2016-04-06 DIAGNOSIS — Z23 Encounter for immunization: Secondary | ICD-10-CM | POA: Diagnosis not present

## 2016-04-08 DIAGNOSIS — C44311 Basal cell carcinoma of skin of nose: Secondary | ICD-10-CM | POA: Diagnosis not present

## 2016-04-08 DIAGNOSIS — Z85828 Personal history of other malignant neoplasm of skin: Secondary | ICD-10-CM | POA: Diagnosis not present

## 2016-04-15 DIAGNOSIS — L570 Actinic keratosis: Secondary | ICD-10-CM | POA: Diagnosis not present

## 2016-04-22 DIAGNOSIS — N2581 Secondary hyperparathyroidism of renal origin: Secondary | ICD-10-CM | POA: Diagnosis not present

## 2016-04-22 DIAGNOSIS — N183 Chronic kidney disease, stage 3 (moderate): Secondary | ICD-10-CM | POA: Diagnosis not present

## 2016-04-30 DIAGNOSIS — N2581 Secondary hyperparathyroidism of renal origin: Secondary | ICD-10-CM | POA: Diagnosis not present

## 2016-04-30 DIAGNOSIS — I10 Essential (primary) hypertension: Secondary | ICD-10-CM | POA: Diagnosis not present

## 2016-04-30 DIAGNOSIS — Z6829 Body mass index (BMI) 29.0-29.9, adult: Secondary | ICD-10-CM | POA: Diagnosis not present

## 2016-04-30 DIAGNOSIS — D631 Anemia in chronic kidney disease: Secondary | ICD-10-CM | POA: Diagnosis not present

## 2016-04-30 DIAGNOSIS — N184 Chronic kidney disease, stage 4 (severe): Secondary | ICD-10-CM | POA: Diagnosis not present

## 2016-05-19 ENCOUNTER — Encounter: Payer: Self-pay | Admitting: Podiatry

## 2016-05-19 ENCOUNTER — Ambulatory Visit (INDEPENDENT_AMBULATORY_CARE_PROVIDER_SITE_OTHER): Payer: Medicare Other | Admitting: Podiatry

## 2016-05-19 VITALS — BP 173/74 | HR 76 | Temp 97.1°F | Resp 18

## 2016-05-19 DIAGNOSIS — M79674 Pain in right toe(s): Secondary | ICD-10-CM

## 2016-05-19 DIAGNOSIS — E1142 Type 2 diabetes mellitus with diabetic polyneuropathy: Secondary | ICD-10-CM

## 2016-05-19 DIAGNOSIS — L84 Corns and callosities: Secondary | ICD-10-CM

## 2016-05-19 DIAGNOSIS — B351 Tinea unguium: Secondary | ICD-10-CM | POA: Diagnosis not present

## 2016-05-19 DIAGNOSIS — M79675 Pain in left toe(s): Secondary | ICD-10-CM

## 2016-05-19 DIAGNOSIS — Q828 Other specified congenital malformations of skin: Secondary | ICD-10-CM | POA: Diagnosis not present

## 2016-05-19 NOTE — Progress Notes (Signed)
Patient ID: [name], male   DOB: 1927/08/15, 82 y.o.   MRN: [id number]    Subjective: As patient presents for scheduled visit complaining of painful toenails and walking wearing shoes as well as a plantar callus on the right foot  Objective: Orientated 3 DP and PT pulses 2/4 bilaterally Capillary reflex immediate bilaterally Sensation to 10 g monofilament wire intact 0/5 right and 2/5 left Vibratory sensation nonreactive bilaterally Ankle reflexes weakly reactive bilaterally Restricted range of motion right ankle and right subtalar joint Hyperpigmentation right ankle Hammertoe 2-5 bilaterally Manual motor testing dorsi flexion, plantar flexion 5/5 bilaterally No open skin lesions bilaterally The toenails are elongated, hypertrophic, discolored, incurvated and tender to direct palpation 6-10 Bleeding plantar callus sub-first right MPJ surrounded with large hyperkeratotic buildup. There is no surrounding erythema, edema, warmth, drainage from the site  Assessment: Symptomatic onychomycoses 6-10 Pre-ulcerative plantar callus right first MPJ Type II diabetic with neuropathy  Plan: Debridement toenails 10 mechanically and electrically without any bleeding Debrided plantar callus right first MPJ without any bleeding Reappoint her reduce the frequency debridement from 3 months 2 months because of the large hyperkeratotic buildup sub-fifth right MPJ  Continue to wear diabetic shoes with additional felt pads to offload the plantar right first MPJ  Reappoint  61 days

## 2016-05-19 NOTE — Patient Instructions (Signed)

## 2016-06-15 DIAGNOSIS — I129 Hypertensive chronic kidney disease with stage 1 through stage 4 chronic kidney disease, or unspecified chronic kidney disease: Secondary | ICD-10-CM | POA: Diagnosis not present

## 2016-06-15 DIAGNOSIS — E1129 Type 2 diabetes mellitus with other diabetic kidney complication: Secondary | ICD-10-CM | POA: Diagnosis not present

## 2016-06-15 DIAGNOSIS — E668 Other obesity: Secondary | ICD-10-CM | POA: Diagnosis not present

## 2016-06-15 DIAGNOSIS — E113553 Type 2 diabetes mellitus with stable proliferative diabetic retinopathy, bilateral: Secondary | ICD-10-CM | POA: Diagnosis not present

## 2016-06-15 DIAGNOSIS — I10 Essential (primary) hypertension: Secondary | ICD-10-CM | POA: Diagnosis not present

## 2016-06-15 DIAGNOSIS — D631 Anemia in chronic kidney disease: Secondary | ICD-10-CM | POA: Diagnosis not present

## 2016-06-15 DIAGNOSIS — K220 Achalasia of cardia: Secondary | ICD-10-CM | POA: Diagnosis not present

## 2016-06-15 DIAGNOSIS — Z6830 Body mass index (BMI) 30.0-30.9, adult: Secondary | ICD-10-CM | POA: Diagnosis not present

## 2016-06-15 DIAGNOSIS — D692 Other nonthrombocytopenic purpura: Secondary | ICD-10-CM | POA: Diagnosis not present

## 2016-06-15 DIAGNOSIS — N183 Chronic kidney disease, stage 3 (moderate): Secondary | ICD-10-CM | POA: Diagnosis not present

## 2016-06-15 DIAGNOSIS — N400 Enlarged prostate without lower urinary tract symptoms: Secondary | ICD-10-CM | POA: Diagnosis not present

## 2016-06-15 DIAGNOSIS — E7800 Pure hypercholesterolemia, unspecified: Secondary | ICD-10-CM | POA: Diagnosis not present

## 2016-07-27 ENCOUNTER — Encounter: Payer: Self-pay | Admitting: Podiatry

## 2016-07-27 ENCOUNTER — Ambulatory Visit (INDEPENDENT_AMBULATORY_CARE_PROVIDER_SITE_OTHER): Payer: Medicare Other | Admitting: Podiatry

## 2016-07-27 VITALS — BP 138/50 | HR 73 | Resp 14

## 2016-07-27 DIAGNOSIS — M79674 Pain in right toe(s): Secondary | ICD-10-CM | POA: Diagnosis not present

## 2016-07-27 DIAGNOSIS — L97511 Non-pressure chronic ulcer of other part of right foot limited to breakdown of skin: Secondary | ICD-10-CM | POA: Diagnosis not present

## 2016-07-27 DIAGNOSIS — B351 Tinea unguium: Secondary | ICD-10-CM | POA: Diagnosis not present

## 2016-07-27 DIAGNOSIS — M79675 Pain in left toe(s): Secondary | ICD-10-CM | POA: Diagnosis not present

## 2016-07-27 DIAGNOSIS — E1142 Type 2 diabetes mellitus with diabetic polyneuropathy: Secondary | ICD-10-CM | POA: Diagnosis not present

## 2016-07-27 MED ORDER — SILVER SULFADIAZINE 1 % EX CREA: 1.0000 "application " | TOPICAL_CREAM | Freq: Every day | CUTANEOUS | 0 refills | Status: DC

## 2016-07-27 NOTE — Patient Instructions (Signed)
Apply Silvadene cream to the skin ulcer on the environment right foot daily and cover with gauze If you notice any sudden increase of pain, swelling, redness, fever present to the emergency department    Diabetes and Foot Care Diabetes may cause you to have problems because of poor blood supply (circulation) to your feet and legs. This may cause the skin on your feet to become thinner, break easier, and heal more slowly. Your skin may become dry, and the skin may peel and crack. You may also have nerve damage in your legs and feet causing decreased feeling in them. You may not notice minor injuries to your feet that could lead to infections or more serious problems. Taking care of your feet is one of the most important things you can do for yourself. Follow these instructions at home:  Wear shoes at all times, even in the house. Do not go barefoot. Bare feet are easily injured.  Check your feet daily for blisters, cuts, and redness. If you cannot see the bottom of your feet, use a mirror or ask someone for help.  Wash your feet with warm water (do not use hot water) and mild soap. Then pat your feet and the areas between your toes until they are completely dry. Do not soak your feet as this can dry your skin.  Apply a moisturizing lotion or petroleum jelly (that does not contain alcohol and is unscented) to the skin on your feet and to dry, brittle toenails. Do not apply lotion between your toes.  Trim your toenails straight across. Do not dig under them or around the cuticle. File the edges of your nails with an emery board or nail file.  Do not cut corns or calluses or try to remove them with medicine.  Wear clean socks or stockings every day. Make sure they are not too tight. Do not wear knee-high stockings since they may decrease blood flow to your legs.  Wear shoes that fit properly and have enough cushioning. To break in new shoes, wear them for just a few hours a day. This prevents you  from injuring your feet. Always look in your shoes before you put them on to be sure there are no objects inside.  Do not cross your legs. This may decrease the blood flow to your feet.  If you find a minor scrape, cut, or break in the skin on your feet, keep it and the skin around it clean and dry. These areas may be cleansed with mild soap and water. Do not cleanse the area with peroxide, alcohol, or iodine.  When you remove an adhesive bandage, be sure not to damage the skin around it.  If you have a wound, look at it several times a day to make sure it is healing.  Do not use heating pads or hot water bottles. They may burn your skin. If you have lost feeling in your feet or legs, you may not know it is happening until it is too late.  Make sure your health care provider performs a complete foot exam at least annually or more often if you have foot problems. Report any cuts, sores, or bruises to your health care provider immediately. Contact a health care provider if:  You have an injury that is not healing.  You have cuts or breaks in the skin.  You have an ingrown nail.  You notice redness on your legs or feet.  You feel burning or tingling in your  legs or feet.  You have pain or cramps in your legs and feet.  Your legs or feet are numb.  Your feet always feel cold. Get help right away if:  There is increasing redness, swelling, or pain in or around a wound.  There is a red line that goes up your leg.  Pus is coming from a wound.  You develop a fever or as directed by your health care provider.  You notice a bad smell coming from an ulcer or wound. This information is not intended to replace advice given to you by your health care provider. Make sure you discuss any questions you have with your health care provider. Document Released: 06/18/2000 Document Revised: 11/27/2015 Document Reviewed: 11/28/2012 Elsevier Interactive Patient Education  2017 [employer].

## 2016-07-27 NOTE — Progress Notes (Signed)
Patient ID: [name], male   DOB: [birth year], 83 y.o.   MRN: [id number]    Subjective: As patient presents for scheduled visit complaining of painful toenails and walking wearing shoes as well as a plantar callus on the right foot  Objective: Orientated 3 DP and PT pulses 2/4 bilaterally Capillary reflex immediate bilaterally Sensation to 10 g monofilament wire intact 0/5 right and 2/5 left Vibratory sensation nonreactive bilaterally Ankle reflexes weakly reactive bilaterally Restricted range of motion right ankle and right subtalar joint Hyperpigmentation right ankle Hammertoe 2-5 bilaterally Manual motor testing dorsi flexion, plantar flexion 5/5 bilaterally The toenails are elongated, hypertrophic, discolored, incurvated and tender to direct palpation 6-10 Bleeding plantar callus sub-first right MPJ surrounded with large hyperkeratotic buildup. There is no surrounding erythema, edema, warmth, drainage from the site. After debridement of this skin lesion breaks down to superficial ulcer with a hyperkeratotic granular base measuring 10 x 15 mm.  Assessment: Symptomatic onychomycoses 6-10 Superficial noninfected skin ulcer plantar right first MPJ Type II diabetic with neuropathy  Plan: Debridement toenails 10 mechanically and electrically without any bleeding Debrided plantar skin ulcer right first MPJ apply Silvadene cream dressing Patient instructed to continue applying Silvadene cream dressing to the plantar right first MPJ daily and cover with gauze Instructed that if he knows any sudden increase in pain, swelling, redness, fever to present to the emergency department  Continue to wear diabetic shoes with additional felt pads to offload the plantar right first MPJ  Reappoint 7 days

## 2016-08-03 ENCOUNTER — Ambulatory Visit (INDEPENDENT_AMBULATORY_CARE_PROVIDER_SITE_OTHER): Payer: Medicare Other | Admitting: Podiatry

## 2016-08-03 DIAGNOSIS — L97511 Non-pressure chronic ulcer of other part of right foot limited to breakdown of skin: Secondary | ICD-10-CM

## 2016-08-03 NOTE — Progress Notes (Signed)
Patient ID: [name], male   DOB: Jul 27, 1927, 83 y.o.   MRN: [id number]  Subjective: This patient presents for follow-up care from the visit of 07/27/2016 with a superficial ulcer plantar right first MPJ measuring 10 x 15 mm. At this time patient says that his foot feels considerably better   Objective: Orientated 3 DP and PT pulses 2/4 bilaterally Capillary reflex immediate bilaterally Sensation to 10 g monofilament wire intact 0/5 right and 2/5 left Vibratory sensation nonreactive bilaterally Ankle reflexes weakly reactive bilaterally Restricted range of motion right ankle and right subtalar joint Hyperpigmentation right ankle Hammertoe 2-5 bilaterally Manual motor testing dorsi flexion, plantar flexion 5/5 bilaterally The toenails are elongated, hypertrophic, discolored, incurvated and tender to direct palpation 6-10 Bleeding plantar callus sub-first right MPJ surrounded with large hyperkeratotic buildup. There is no surrounding erythema, edema, warmth, drainage from the site. After debridement of this skin lesion breaks down to superficial ulcer with a hyperkeratotic measuring 3 mm. There is no surrounding erythema, edema, warmth or active drainage  Assessment: Reducing superficial skin ulcer plantar aspect of right first MPJ without clinical sign of infection  Plan: Debride skin ulcer and apply Silvadene gauze. Patient instructed to apply Silvadene cream daily and to continue wearing diabetic shoes with additional felt padding to offload the plantar right first MPJ  Reappoint 7 days

## 2016-08-03 NOTE — Patient Instructions (Signed)
Apply Silvadene cream to the skin ulcer on the right foot daily and cover with gauze Wear diabetic shoes with extra padding If you notice any sudden increase of pain, swelling, redness, fever, drainage present to emergency department  Diabetes and Foot Care Diabetes may cause you to have problems because of poor blood supply (circulation) to your feet and legs. This may cause the skin on your feet to become thinner, break easier, and heal more slowly. Your skin may become dry, and the skin may peel and crack. You may also have nerve damage in your legs and feet causing decreased feeling in them. You may not notice minor injuries to your feet that could lead to infections or more serious problems. Taking care of your feet is one of the most important things you can do for yourself. Follow these instructions at home:  Wear shoes at all times, even in the house. Do not go barefoot. Bare feet are easily injured.  Check your feet daily for blisters, cuts, and redness. If you cannot see the bottom of your feet, use a mirror or ask someone for help.  Wash your feet with warm water (do not use hot water) and mild soap. Then pat your feet and the areas between your toes until they are completely dry. Do not soak your feet as this can dry your skin.  Apply a moisturizing lotion or petroleum jelly (that does not contain alcohol and is unscented) to the skin on your feet and to dry, brittle toenails. Do not apply lotion between your toes.  Trim your toenails straight across. Do not dig under them or around the cuticle. File the edges of your nails with an emery board or nail file.  Do not cut corns or calluses or try to remove them with medicine.  Wear clean socks or stockings every day. Make sure they are not too tight. Do not wear knee-high stockings since they may decrease blood flow to your legs.  Wear shoes that fit properly and have enough cushioning. To break in new shoes, wear them for just a few  hours a day. This prevents you from injuring your feet. Always look in your shoes before you put them on to be sure there are no objects inside.  Do not cross your legs. This may decrease the blood flow to your feet.  If you find a minor scrape, cut, or break in the skin on your feet, keep it and the skin around it clean and dry. These areas may be cleansed with mild soap and water. Do not cleanse the area with peroxide, alcohol, or iodine.  When you remove an adhesive bandage, be sure not to damage the skin around it.  If you have a wound, look at it several times a day to make sure it is healing.  Do not use heating pads or hot water bottles. They may burn your skin. If you have lost feeling in your feet or legs, you may not know it is happening until it is too late.  Make sure your health care provider performs a complete foot exam at least annually or more often if you have foot problems. Report any cuts, sores, or bruises to your health care provider immediately. Contact a health care provider if:  You have an injury that is not healing.  You have cuts or breaks in the skin.  You have an ingrown nail.  You notice redness on your legs or feet.  You feel burning or  tingling in your legs or feet.  You have pain or cramps in your legs and feet.  Your legs or feet are numb.  Your feet always feel cold. Get help right away if:  There is increasing redness, swelling, or pain in or around a wound.  There is a red line that goes up your leg.  Pus is coming from a wound.  You develop a fever or as directed by your health care provider.  You notice a bad smell coming from an ulcer or wound. This information is not intended to replace advice given to you by your health care provider. Make sure you discuss any questions you have with your health care provider. Document Released: 06/18/2000 Document Revised: 11/27/2015 Document Reviewed: 11/28/2012 Elsevier Interactive Patient  Education  2017 [employer].

## 2016-08-11 ENCOUNTER — Encounter: Payer: Self-pay | Admitting: Podiatry

## 2016-08-11 ENCOUNTER — Ambulatory Visit (INDEPENDENT_AMBULATORY_CARE_PROVIDER_SITE_OTHER): Payer: Medicare Other | Admitting: Podiatry

## 2016-08-11 VITALS — BP 141/55 | HR 71 | Temp 96.1°F | Resp 18

## 2016-08-11 DIAGNOSIS — E1142 Type 2 diabetes mellitus with diabetic polyneuropathy: Secondary | ICD-10-CM

## 2016-08-11 DIAGNOSIS — L84 Corns and callosities: Secondary | ICD-10-CM

## 2016-08-11 DIAGNOSIS — Q828 Other specified congenital malformations of skin: Secondary | ICD-10-CM | POA: Diagnosis not present

## 2016-08-11 NOTE — Patient Instructions (Signed)
disontinue the Silvadene cream   apply Vasele and a BandAid to the previous  Skin ulcer ball of the  Right foot   where your existing diabetic insole with an additional felt pad on the right foot daily   observe the bottom of the right foot on a daily basis   if you notice any sudden increase in pain, swelling, redness, fever present to the emergency department   Diabetes and Foot Care Diabetes may cause you to have problems because of poor blood supply (circulation) to your feet and legs. This may cause the skin on your feet to become thinner, break easier, and heal more slowly. Your skin may become dry, and the skin may peel and crack. You may also have nerve damage in your legs and feet causing decreased feeling in them. You may not notice minor injuries to your feet that could lead to infections or more serious problems. Taking care of your feet is one of the most important things you can do for yourself. Follow these instructions at home:  Wear shoes at all times, even in the house. Do not go barefoot. Bare feet are easily injured.  Check your feet daily for blisters, cuts, and redness. If you cannot see the bottom of your feet, use a mirror or ask someone for help.  Wash your feet with warm water (do not use hot water) and mild soap. Then pat your feet and the areas between your toes until they are completely dry. Do not soak your feet as this can dry your skin.  Apply a moisturizing lotion or petroleum jelly (that does not contain alcohol and is unscented) to the skin on your feet and to dry, brittle toenails. Do not apply lotion between your toes.  Trim your toenails straight across. Do not dig under them or around the cuticle. File the edges of your nails with an emery board or nail file.  Do not cut corns or calluses or try to remove them with medicine.  Wear clean socks or stockings every day. Make sure they are not too tight. Do not wear knee-high stockings since they may  decrease blood flow to your legs.  Wear shoes that fit properly and have enough cushioning. To break in new shoes, wear them for just a few hours a day. This prevents you from injuring your feet. Always look in your shoes before you put them on to be sure there are no objects inside.  Do not cross your legs. This may decrease the blood flow to your feet.  If you find a minor scrape, cut, or break in the skin on your feet, keep it and the skin around it clean and dry. These areas may be cleansed with mild soap and water. Do not cleanse the area with peroxide, alcohol, or iodine.  When you remove an adhesive bandage, be sure not to damage the skin around it.  If you have a wound, look at it several times a day to make sure it is healing.  Do not use heating pads or hot water bottles. They may burn your skin. If you have lost feeling in your feet or legs, you may not know it is happening until it is too late.  Make sure your health care provider performs a complete foot exam at least annually or more often if you have foot problems. Report any cuts, sores, or bruises to your health care provider immediately. Contact a health care provider if:  You have  an injury that is not healing.  You have cuts or breaks in the skin.  You have an ingrown nail.  You notice redness on your legs or feet.  You feel burning or tingling in your legs or feet.  You have pain or cramps in your legs and feet.  Your legs or feet are numb.  Your feet always feel cold. Get help right away if:  There is increasing redness, swelling, or pain in or around a wound.  There is a red line that goes up your leg.  Pus is coming from a wound.  You develop a fever or as directed by your health care provider.  You notice a bad smell coming from an ulcer or wound. This information is not intended to replace advice given to you by your health care provider. Make sure you discuss any questions you have with your  health care provider. Document Released: 06/18/2000 Document Revised: 11/27/2015 Document Reviewed: 11/28/2012 Elsevier Interactive Patient Education  2017 [employer].

## 2016-08-11 NOTE — Progress Notes (Signed)
Patient ID: [name], male   DOB: 03-Dec-1927, 83 y.o.   MRN: [id number]    Subjective: This patient presents for follow-up care from the initial visit of 07/27/2016 with a superficial ulcer plantar right first MPJ measuring 10 x 15 mm. At this time patient says that his foot feels considerably better   Objective: Orientated 3 DP and PT pulses 2/4 bilaterally Capillary reflex immediate bilaterally Sensation to 10 g monofilament wire intact 0/5 right and 2/5 left Vibratory sensation nonreactive bilaterally Ankle reflexes weakly reactive bilaterally Restricted range of motion right ankle and right subtalar joint Hyperpigmentation right ankle Hammertoe 2-5 bilaterally Manual motor testing dorsi flexion, plantar flexion 5/5 bilaterally The toenails are elongated, hypertrophic, discolored, incurvated  Bleeding plantar callus sub-first right MPJ surrounded with large hyperkeratotic buildup. There is no surrounding erythema, edema, warmth, drainage from the site.After debridement the plantar callus remains closed  Assessment: Pre-ulcerative plantar callus first MPJ right Diabetic peripheral neuropathy  Plan: Debrided pre-ulcerative callus DC Silvadene Okay to apply Vaseline and Band-Aid to area Wear custom diabetic insoles with additional felt pad to offload the plantar right first MPJ  Reappoint for previously scheduled visit for debridement of mycotic toenails

## 2016-08-16 DIAGNOSIS — N2581 Secondary hyperparathyroidism of renal origin: Secondary | ICD-10-CM | POA: Diagnosis not present

## 2016-08-16 DIAGNOSIS — N184 Chronic kidney disease, stage 4 (severe): Secondary | ICD-10-CM | POA: Diagnosis not present

## 2016-08-16 DIAGNOSIS — N189 Chronic kidney disease, unspecified: Secondary | ICD-10-CM | POA: Diagnosis not present

## 2016-08-23 DIAGNOSIS — N184 Chronic kidney disease, stage 4 (severe): Secondary | ICD-10-CM | POA: Diagnosis not present

## 2016-08-23 DIAGNOSIS — D631 Anemia in chronic kidney disease: Secondary | ICD-10-CM | POA: Diagnosis not present

## 2016-08-23 DIAGNOSIS — N2581 Secondary hyperparathyroidism of renal origin: Secondary | ICD-10-CM | POA: Diagnosis not present

## 2016-08-23 DIAGNOSIS — I10 Essential (primary) hypertension: Secondary | ICD-10-CM | POA: Diagnosis not present

## 2016-10-26 ENCOUNTER — Ambulatory Visit (INDEPENDENT_AMBULATORY_CARE_PROVIDER_SITE_OTHER): Payer: Medicare Other | Admitting: Podiatry

## 2016-10-26 ENCOUNTER — Encounter: Payer: Self-pay | Admitting: Podiatry

## 2016-10-26 VITALS — BP 162/78 | HR 71 | Temp 97.7°F

## 2016-10-26 DIAGNOSIS — L97521 Non-pressure chronic ulcer of other part of left foot limited to breakdown of skin: Secondary | ICD-10-CM

## 2016-10-26 DIAGNOSIS — L03119 Cellulitis of unspecified part of limb: Secondary | ICD-10-CM | POA: Diagnosis not present

## 2016-10-26 DIAGNOSIS — L02619 Cutaneous abscess of unspecified foot: Secondary | ICD-10-CM | POA: Diagnosis not present

## 2016-10-26 MED ORDER — CEPHALEXIN 500 MG PO CAPS: 500.0000 mg | ORAL_CAPSULE | Freq: Three times a day (TID) | ORAL | 1 refills | Status: DC

## 2016-10-26 NOTE — Patient Instructions (Signed)
Apply Silvadene cream to the skin ulcer on the fourth left toe daily and cover with gauze Wear the surgical shoe on left foot daily Begin oral antibiotics 1 capsule 3 times a day 7 days If you develop any sudden increase in pain, swelling, redness, fever present immediately to the emergency department

## 2016-10-26 NOTE — Progress Notes (Signed)
   Subjective:    Patient ID: [name], male    DOB: 1928-04-06, 83 y.o.   MRN: [id number]  HPI This patient presents today for a scheduled visit for debridement of pre-ulcerative plantar callus in the right first MPJ. Patient describes approximately 45 day history of a local sore on the fourth left toe which she self treated with Silvadene cream.   Review of Systems  All other systems reviewed and are negative.      Objective:   Physical Exam Objective: Orientated 3 DP and PT pulses 2/4 bilaterally Capillary reflex immediate bilaterally Sensation to 10 g monofilament wire intact 0/5 right and 2/5 left Vibratory sensation nonreactive bilaterally Ankle reflexes weakly reactive bilaterally Restricted range of motion right ankle and right subtalar joint Hyperpigmentation right ankle Hammertoe 2-5 bilaterally Manual motor testing dorsi flexion, plantar flexion 5/5 bilaterally The toenails are elongated, hypertrophic, discolored, incurvated  Bleeding plantar callus sub-first right MPJ without any surrounding erythema or edema 5 mm superficial ulcer dorsal PIPJ fourth left toe with erythema that extends proximally to the mid foot. There is a low-grade edema without warmth      Assessment & Plan:   Assessment: Diabetic peripheral neuropathy Superficial skin ulcer fourth left toe with associated cellulitis Pre-ulcerative plantar callus right  Plan: Today I informed patient that the left foot and ulceration with associated cellulitis The ulcer fourth of toes debrided and dressed with Silvadene Surgical shoe dispensed to wear and left foot Rx cephalexin 500 mg by mouth 3 times a day 7 days Limit standing walking Instructed patient to observe for any sudden increase of swelling, redness, pain, fever and if observe present to the emergency department  Reappoint 7 days

## 2016-12-03 DIAGNOSIS — E7800 Pure hypercholesterolemia, unspecified: Secondary | ICD-10-CM | POA: Diagnosis not present

## 2016-12-03 DIAGNOSIS — E538 Deficiency of other specified B group vitamins: Secondary | ICD-10-CM | POA: Diagnosis not present

## 2016-12-03 DIAGNOSIS — N183 Chronic kidney disease, stage 3 (moderate): Secondary | ICD-10-CM | POA: Diagnosis not present

## 2016-12-03 DIAGNOSIS — E113553 Type 2 diabetes mellitus with stable proliferative diabetic retinopathy, bilateral: Secondary | ICD-10-CM | POA: Diagnosis not present

## 2016-12-03 DIAGNOSIS — N390 Urinary tract infection, site not specified: Secondary | ICD-10-CM | POA: Diagnosis not present

## 2016-12-03 DIAGNOSIS — E559 Vitamin D deficiency, unspecified: Secondary | ICD-10-CM | POA: Diagnosis not present

## 2016-12-03 DIAGNOSIS — Z125 Encounter for screening for malignant neoplasm of prostate: Secondary | ICD-10-CM | POA: Diagnosis not present

## 2016-12-03 DIAGNOSIS — D510 Vitamin B12 deficiency anemia due to intrinsic factor deficiency: Secondary | ICD-10-CM | POA: Diagnosis not present

## 2016-12-03 DIAGNOSIS — R8299 Other abnormal findings in urine: Secondary | ICD-10-CM | POA: Diagnosis not present

## 2016-12-10 DIAGNOSIS — Z6830 Body mass index (BMI) 30.0-30.9, adult: Secondary | ICD-10-CM | POA: Diagnosis not present

## 2016-12-10 DIAGNOSIS — Z1389 Encounter for screening for other disorder: Secondary | ICD-10-CM | POA: Diagnosis not present

## 2016-12-10 DIAGNOSIS — E113553 Type 2 diabetes mellitus with stable proliferative diabetic retinopathy, bilateral: Secondary | ICD-10-CM | POA: Diagnosis not present

## 2016-12-10 DIAGNOSIS — C4492 Squamous cell carcinoma of skin, unspecified: Secondary | ICD-10-CM | POA: Diagnosis not present

## 2016-12-10 DIAGNOSIS — K220 Achalasia of cardia: Secondary | ICD-10-CM | POA: Diagnosis not present

## 2016-12-10 DIAGNOSIS — E11319 Type 2 diabetes mellitus with unspecified diabetic retinopathy without macular edema: Secondary | ICD-10-CM | POA: Diagnosis not present

## 2016-12-10 DIAGNOSIS — R808 Other proteinuria: Secondary | ICD-10-CM | POA: Diagnosis not present

## 2016-12-10 DIAGNOSIS — I129 Hypertensive chronic kidney disease with stage 1 through stage 4 chronic kidney disease, or unspecified chronic kidney disease: Secondary | ICD-10-CM | POA: Diagnosis not present

## 2016-12-10 DIAGNOSIS — Z0000 Encounter for general adult medical examination without abnormal findings: Secondary | ICD-10-CM | POA: Diagnosis not present

## 2016-12-10 DIAGNOSIS — E1129 Type 2 diabetes mellitus with other diabetic kidney complication: Secondary | ICD-10-CM | POA: Diagnosis not present

## 2016-12-10 DIAGNOSIS — D631 Anemia in chronic kidney disease: Secondary | ICD-10-CM | POA: Diagnosis not present

## 2016-12-10 DIAGNOSIS — N183 Chronic kidney disease, stage 3 (moderate): Secondary | ICD-10-CM | POA: Diagnosis not present

## 2016-12-13 DIAGNOSIS — N184 Chronic kidney disease, stage 4 (severe): Secondary | ICD-10-CM | POA: Diagnosis not present

## 2016-12-13 DIAGNOSIS — N2581 Secondary hyperparathyroidism of renal origin: Secondary | ICD-10-CM | POA: Diagnosis not present

## 2016-12-13 DIAGNOSIS — N189 Chronic kidney disease, unspecified: Secondary | ICD-10-CM | POA: Diagnosis not present

## 2016-12-20 DIAGNOSIS — D631 Anemia in chronic kidney disease: Secondary | ICD-10-CM | POA: Diagnosis not present

## 2016-12-20 DIAGNOSIS — Z6829 Body mass index (BMI) 29.0-29.9, adult: Secondary | ICD-10-CM | POA: Diagnosis not present

## 2016-12-20 DIAGNOSIS — N184 Chronic kidney disease, stage 4 (severe): Secondary | ICD-10-CM | POA: Diagnosis not present

## 2016-12-20 DIAGNOSIS — N2581 Secondary hyperparathyroidism of renal origin: Secondary | ICD-10-CM | POA: Diagnosis not present

## 2016-12-20 DIAGNOSIS — I10 Essential (primary) hypertension: Secondary | ICD-10-CM | POA: Diagnosis not present

## 2017-01-25 ENCOUNTER — Ambulatory Visit (INDEPENDENT_AMBULATORY_CARE_PROVIDER_SITE_OTHER): Payer: Medicare Other | Admitting: Podiatry

## 2017-01-25 DIAGNOSIS — L84 Corns and callosities: Secondary | ICD-10-CM

## 2017-01-25 DIAGNOSIS — M79674 Pain in right toe(s): Secondary | ICD-10-CM | POA: Diagnosis not present

## 2017-01-25 DIAGNOSIS — B351 Tinea unguium: Secondary | ICD-10-CM

## 2017-01-25 DIAGNOSIS — M79675 Pain in left toe(s): Secondary | ICD-10-CM | POA: Diagnosis not present

## 2017-01-25 DIAGNOSIS — E1142 Type 2 diabetes mellitus with diabetic polyneuropathy: Secondary | ICD-10-CM

## 2017-01-25 NOTE — Progress Notes (Signed)
Patient ID: [name], male   DOB: Aug 31, 1927, 83 y.o.   MRN: [id number]   Subjective: Patient presents today for follow-up care for history of a pre-ulcerative plantar callus as well as a superficial ulcer on the fourth left toe. Patient was last evaluated on 10/26/2016 was instructed return in 7 days, however, does not present until today. Also, patient complaining of uncomfortable toenails walking wearing shoes request nail debridement   Objective: Orientated 3 DP and PT pulses 2/4 bilaterally Capillary reflex immediate bilaterally Sensation to 10 g monofilament wire intact 0/5 right and 2/5 left Vibratory sensation nonreactive bilaterally Ankle reflexes weakly reactive bilaterally Restricted range of motion right ankle and right subtalar joint Hyperpigmentation right ankle Hammertoe 2-5 bilaterally Manual motor testing dorsi flexion, plantar flexion 5/5 bilaterally The toenails are elongated, hypertrophic, discolored, incurvated with tenderness to direct palpation  Bleeding plantar callus sub-first right MPJ without any surrounding erythema or edema, remains closed after debridement Crusted, corn dorsal fourth left toe with dried blood within lesion. Remains closed after debridement  Assessment: Diabetic peripheral neuropathy Pre-ulcerative plantar callus right and fourth left toe Symptomatic mycotic toenails 6-10  Plan: Debridement toenails 6-10 mechanically and electrically without any bleeding Debride keratoses 2 without any bleeding Patient instructed to apply Silvadene cream or antibiotic ointment if any drainage from to skin lesions are noted. Instructed to notice any increase in pain, swelling, redness, fever present to emergency room  Reappoint 2 months

## 2017-01-25 NOTE — Patient Instructions (Signed)
If you see any drainage from the corn on the fourth left toe or callus on the bottom the right foot apply Silvadene cream or antibiotic ointment If you notice any sudden increase in pain, swelling, redness, fevers reviewed her site present to the emergency department or contact office immediately Diabetes and Foot Care Diabetes may cause you to have problems because of poor blood supply (circulation) to your feet and legs. This may cause the skin on your feet to become thinner, break easier, and heal more slowly. Your skin may become dry, and the skin may peel and crack. You may also have nerve damage in your legs and feet causing decreased feeling in them. You may not notice minor injuries to your feet that could lead to infections or more serious problems. Taking care of your feet is one of the most important things you can do for yourself. Follow these instructions at home:  Wear shoes at all times, even in the house. Do not go barefoot. Bare feet are easily injured.  Check your feet daily for blisters, cuts, and redness. If you cannot see the bottom of your feet, use a mirror or ask someone for help.  Wash your feet with warm water (do not use hot water) and mild soap. Then pat your feet and the areas between your toes until they are completely dry. Do not soak your feet as this can dry your skin.  Apply a moisturizing lotion or petroleum jelly (that does not contain alcohol and is unscented) to the skin on your feet and to dry, brittle toenails. Do not apply lotion between your toes.  Trim your toenails straight across. Do not dig under them or around the cuticle. File the edges of your nails with an emery board or nail file.  Do not cut corns or calluses or try to remove them with medicine.  Wear clean socks or stockings every day. Make sure they are not too tight. Do not wear knee-high stockings since they may decrease blood flow to your legs.  Wear shoes that fit properly and have  enough cushioning. To break in new shoes, wear them for just a few hours a day. This prevents you from injuring your feet. Always look in your shoes before you put them on to be sure there are no objects inside.  Do not cross your legs. This may decrease the blood flow to your feet.  If you find a minor scrape, cut, or break in the skin on your feet, keep it and the skin around it clean and dry. These areas may be cleansed with mild soap and water. Do not cleanse the area with peroxide, alcohol, or iodine.  When you remove an adhesive bandage, be sure not to damage the skin around it.  If you have a wound, look at it several times a day to make sure it is healing.  Do not use heating pads or hot water bottles. They may burn your skin. If you have lost feeling in your feet or legs, you may not know it is happening until it is too late.  Make sure your health care provider performs a complete foot exam at least annually or more often if you have foot problems. Report any cuts, sores, or bruises to your health care provider immediately. Contact a health care provider if:  You have an injury that is not healing.  You have cuts or breaks in the skin.  You have an ingrown nail.  You notice  redness on your legs or feet.  You feel burning or tingling in your legs or feet.  You have pain or cramps in your legs and feet.  Your legs or feet are numb.  Your feet always feel cold. Get help right away if:  There is increasing redness, swelling, or pain in or around a wound.  There is a red line that goes up your leg.  Pus is coming from a wound.  You develop a fever or as directed by your health care provider.  You notice a bad smell coming from an ulcer or wound. This information is not intended to replace advice given to you by your health care provider. Make sure you discuss any questions you have with your health care provider. Document Released: 06/18/2000 Document Revised:  11/27/2015 Document Reviewed: 11/28/2012 Elsevier Interactive Patient Education  2017 [employer].

## 2017-03-15 DIAGNOSIS — Z23 Encounter for immunization: Secondary | ICD-10-CM | POA: Diagnosis not present

## 2017-03-28 DIAGNOSIS — Z85828 Personal history of other malignant neoplasm of skin: Secondary | ICD-10-CM | POA: Diagnosis not present

## 2017-03-28 DIAGNOSIS — L821 Other seborrheic keratosis: Secondary | ICD-10-CM | POA: Diagnosis not present

## 2017-03-28 DIAGNOSIS — L570 Actinic keratosis: Secondary | ICD-10-CM | POA: Diagnosis not present

## 2017-03-28 DIAGNOSIS — C44319 Basal cell carcinoma of skin of other parts of face: Secondary | ICD-10-CM | POA: Diagnosis not present

## 2017-03-28 DIAGNOSIS — D0439 Carcinoma in situ of skin of other parts of face: Secondary | ICD-10-CM | POA: Diagnosis not present

## 2017-03-28 DIAGNOSIS — C4441 Basal cell carcinoma of skin of scalp and neck: Secondary | ICD-10-CM | POA: Diagnosis not present

## 2017-03-28 DIAGNOSIS — D485 Neoplasm of uncertain behavior of skin: Secondary | ICD-10-CM | POA: Diagnosis not present

## 2017-03-29 ENCOUNTER — Ambulatory Visit (INDEPENDENT_AMBULATORY_CARE_PROVIDER_SITE_OTHER): Payer: Medicare Other | Admitting: Podiatry

## 2017-03-29 ENCOUNTER — Encounter: Payer: Self-pay | Admitting: Podiatry

## 2017-03-29 DIAGNOSIS — B351 Tinea unguium: Secondary | ICD-10-CM | POA: Diagnosis not present

## 2017-03-29 DIAGNOSIS — L84 Corns and callosities: Secondary | ICD-10-CM | POA: Diagnosis not present

## 2017-03-29 DIAGNOSIS — E1142 Type 2 diabetes mellitus with diabetic polyneuropathy: Secondary | ICD-10-CM

## 2017-03-29 DIAGNOSIS — M79675 Pain in left toe(s): Secondary | ICD-10-CM | POA: Diagnosis not present

## 2017-03-29 DIAGNOSIS — M79674 Pain in right toe(s): Secondary | ICD-10-CM

## 2017-03-29 NOTE — Progress Notes (Signed)
Patient ID: [name], male   DOB: 09-19-1927, 83 y.o.   MRN: [id number]    Subjective: Patient presents today for follow-up care for history of a pre-ulcerative plantar callus as well as a superficial ulcer on the fourth left toe. Patient was last evaluated on 10/26/2016 was instructed return in 7 days, however, does not present until today. Also, patient complaining of uncomfortable toenails walking wearing shoes request nail debridement   Objective: Orientated 3 DP and PT pulses 2/4 bilaterally Capillary reflex immediate bilaterally Sensation to 10 g monofilament wire intact 0/5 right and 2/5 left Vibratory sensation nonreactive bilaterally Ankle reflexes weakly reactive bilaterally Restricted range of motion right ankle and right subtalar joint Hyperpigmentation right ankle Hammertoe 2-5 bilaterally Manual motor testing dorsi flexion, plantar flexion 5/5 bilaterally The toenails are elongated, hypertrophic, discolored, incurvated with tenderness to direct palpation  Bleeding plantar callus sub-first right MPJ without any surrounding erythema or edema, remains closed after debridement Crusted, corn dorsal fourth left toe with dried blood within lesion. Remains closed after debridement  Assessment: Diabetic peripheral neuropathy Pre-ulcerative plantar callus right and fourth left toe Symptomatic mycotic toenails 6-10  Plan: Debridement toenails 6-10 mechanically and electrically without any bleeding Debride keratoses 2 without any bleeding Patient instructed to apply Silvadene cream or antibiotic ointment if any drainage from to skin lesions are noted. Instructed to notice any increase in pain, swelling, redness, fever present to emergency room Wear custom orthotics with with additional padding to offload the plantar right first MPJ   Reappoint 2 months

## 2017-03-29 NOTE — Patient Instructions (Signed)
If you notice any drainage from the pre-ulcerative callus on the ball of the right foot apply Silvadene or topical antibiotic ointment to the area Continue wearing accommodative orthotics with additional padding to offload the pre-ulcerative area on the right foot  Diabetes and Foot Care Diabetes may cause you to have problems because of poor blood supply (circulation) to your feet and legs. This may cause the skin on your feet to become thinner, break easier, and heal more slowly. Your skin may become dry, and the skin may peel and crack. You may also have nerve damage in your legs and feet causing decreased feeling in them. You may not notice minor injuries to your feet that could lead to infections or more serious problems. Taking care of your feet is one of the most important things you can do for yourself. Follow these instructions at home:  Wear shoes at all times, even in the house. Do not go barefoot. Bare feet are easily injured.  Check your feet daily for blisters, cuts, and redness. If you cannot see the bottom of your feet, use a mirror or ask someone for help.  Wash your feet with warm water (do not use hot water) and mild soap. Then pat your feet and the areas between your toes until they are completely dry. Do not soak your feet as this can dry your skin.  Apply a moisturizing lotion or petroleum jelly (that does not contain alcohol and is unscented) to the skin on your feet and to dry, brittle toenails. Do not apply lotion between your toes.  Trim your toenails straight across. Do not dig under them or around the cuticle. File the edges of your nails with an emery board or nail file.  Do not cut corns or calluses or try to remove them with medicine.  Wear clean socks or stockings every day. Make sure they are not too tight. Do not wear knee-high stockings since they may decrease blood flow to your legs.  Wear shoes that fit properly and have enough cushioning. To break in new  shoes, wear them for just a few hours a day. This prevents you from injuring your feet. Always look in your shoes before you put them on to be sure there are no objects inside.  Do not cross your legs. This may decrease the blood flow to your feet.  If you find a minor scrape, cut, or break in the skin on your feet, keep it and the skin around it clean and dry. These areas may be cleansed with mild soap and water. Do not cleanse the area with peroxide, alcohol, or iodine.  When you remove an adhesive bandage, be sure not to damage the skin around it.  If you have a wound, look at it several times a day to make sure it is healing.  Do not use heating pads or hot water bottles. They may burn your skin. If you have lost feeling in your feet or legs, you may not know it is happening until it is too late.  Make sure your health care provider performs a complete foot exam at least annually or more often if you have foot problems. Report any cuts, sores, or bruises to your health care provider immediately. Contact a health care provider if:  You have an injury that is not healing.  You have cuts or breaks in the skin.  You have an ingrown nail.  You notice redness on your legs or feet.  You  feel burning or tingling in your legs or feet.  You have pain or cramps in your legs and feet.  Your legs or feet are numb.  Your feet always feel cold. Get help right away if:  There is increasing redness, swelling, or pain in or around a wound.  There is a red line that goes up your leg.  Pus is coming from a wound.  You develop a fever or as directed by your health care provider.  You notice a bad smell coming from an ulcer or wound. This information is not intended to replace advice given to you by your health care provider. Make sure you discuss any questions you have with your health care provider. Document Released: 06/18/2000 Document Revised: 11/27/2015 Document Reviewed:  11/28/2012 Elsevier Interactive Patient Education  2017 [employer].

## 2017-04-11 DIAGNOSIS — N189 Chronic kidney disease, unspecified: Secondary | ICD-10-CM | POA: Diagnosis not present

## 2017-04-11 DIAGNOSIS — N2581 Secondary hyperparathyroidism of renal origin: Secondary | ICD-10-CM | POA: Diagnosis not present

## 2017-04-11 DIAGNOSIS — N184 Chronic kidney disease, stage 4 (severe): Secondary | ICD-10-CM | POA: Diagnosis not present

## 2017-04-18 DIAGNOSIS — N184 Chronic kidney disease, stage 4 (severe): Secondary | ICD-10-CM | POA: Diagnosis not present

## 2017-04-18 DIAGNOSIS — D631 Anemia in chronic kidney disease: Secondary | ICD-10-CM | POA: Diagnosis not present

## 2017-04-18 DIAGNOSIS — I10 Essential (primary) hypertension: Secondary | ICD-10-CM | POA: Diagnosis not present

## 2017-04-18 DIAGNOSIS — N2581 Secondary hyperparathyroidism of renal origin: Secondary | ICD-10-CM | POA: Diagnosis not present

## 2017-04-27 DIAGNOSIS — N189 Chronic kidney disease, unspecified: Secondary | ICD-10-CM | POA: Diagnosis not present

## 2017-04-27 DIAGNOSIS — N184 Chronic kidney disease, stage 4 (severe): Secondary | ICD-10-CM | POA: Diagnosis not present

## 2017-06-06 ENCOUNTER — Ambulatory Visit (INDEPENDENT_AMBULATORY_CARE_PROVIDER_SITE_OTHER): Payer: Medicare Other | Admitting: Podiatry

## 2017-06-06 ENCOUNTER — Encounter: Payer: Self-pay | Admitting: Podiatry

## 2017-06-06 DIAGNOSIS — E1142 Type 2 diabetes mellitus with diabetic polyneuropathy: Secondary | ICD-10-CM | POA: Diagnosis not present

## 2017-06-06 DIAGNOSIS — M79674 Pain in right toe(s): Secondary | ICD-10-CM | POA: Diagnosis not present

## 2017-06-06 DIAGNOSIS — B351 Tinea unguium: Secondary | ICD-10-CM | POA: Diagnosis not present

## 2017-06-06 DIAGNOSIS — M79675 Pain in left toe(s): Secondary | ICD-10-CM

## 2017-06-06 DIAGNOSIS — L84 Corns and callosities: Secondary | ICD-10-CM | POA: Diagnosis not present

## 2017-06-06 NOTE — Patient Instructions (Signed)
If there is any drainage from the callus on the bottom of the right foot apply Silvadene cream Continue wearing your diabetic shoes with cutout pads  Diabetes and Foot Care Diabetes may cause you to have problems because of poor blood supply (circulation) to your feet and legs. This may cause the skin on your feet to become thinner, break easier, and heal more slowly. Your skin may become dry, and the skin may peel and crack. You may also have nerve damage in your legs and feet causing decreased feeling in them. You may not notice minor injuries to your feet that could lead to infections or more serious problems. Taking care of your feet is one of the most important things you can do for yourself. Follow these instructions at home:  Wear shoes at all times, even in the house. Do not go barefoot. Bare feet are easily injured.  Check your feet daily for blisters, cuts, and redness. If you cannot see the bottom of your feet, use a mirror or ask someone for help.  Wash your feet with warm water (do not use hot water) and mild soap. Then pat your feet and the areas between your toes until they are completely dry. Do not soak your feet as this can dry your skin.  Apply a moisturizing lotion or petroleum jelly (that does not contain alcohol and is unscented) to the skin on your feet and to dry, brittle toenails. Do not apply lotion between your toes.  Trim your toenails straight across. Do not dig under them or around the cuticle. File the edges of your nails with an emery board or nail file.  Do not cut corns or calluses or try to remove them with medicine.  Wear clean socks or stockings every day. Make sure they are not too tight. Do not wear knee-high stockings since they may decrease blood flow to your legs.  Wear shoes that fit properly and have enough cushioning. To break in new shoes, wear them for just a few hours a day. This prevents you from injuring your feet. Always look in your shoes  before you put them on to be sure there are no objects inside.  Do not cross your legs. This may decrease the blood flow to your feet.  If you find a minor scrape, cut, or break in the skin on your feet, keep it and the skin around it clean and dry. These areas may be cleansed with mild soap and water. Do not cleanse the area with peroxide, alcohol, or iodine.  When you remove an adhesive bandage, be sure not to damage the skin around it.  If you have a wound, look at it several times a day to make sure it is healing.  Do not use heating pads or hot water bottles. They may burn your skin. If you have lost feeling in your feet or legs, you may not know it is happening until it is too late.  Make sure your health care provider performs a complete foot exam at least annually or more often if you have foot problems. Report any cuts, sores, or bruises to your health care provider immediately. Contact a health care provider if:  You have an injury that is not healing.  You have cuts or breaks in the skin.  You have an ingrown nail.  You notice redness on your legs or feet.  You feel burning or tingling in your legs or feet.  You have pain or cramps  in your legs and feet.  Your legs or feet are numb.  Your feet always feel cold. Get help right away if:  There is increasing redness, swelling, or pain in or around a wound.  There is a red line that goes up your leg.  Pus is coming from a wound.  You develop a fever or as directed by your health care provider.  You notice a bad smell coming from an ulcer or wound. This information is not intended to replace advice given to you by your health care provider. Make sure you discuss any questions you have with your health care provider. Document Released: 06/18/2000 Document Revised: 11/27/2015 Document Reviewed: 11/28/2012 Elsevier Interactive Patient Education  2017 [employer].

## 2017-06-06 NOTE — Progress Notes (Signed)
Patient ID: [name], male   DOB: [birth year], 83 y.o.   MRN: [id number]    Subjective: Patient presents today for follow-up care for history of a pre-ulcerative plantar callus as well as a superficial ulcer on the fourth left toe. Patient was last evaluated on 10/26/2016 was instructed return in 7 days, however, does not present until today. Also, patient complaining of uncomfortable toenails walking wearing shoes request nail debridement   Objective: Orientated 3 DP and PT pulses 2/4 bilaterally Capillary reflex immediate bilaterally Sensation to 10 g monofilament wire intact 0/5 right and 2/5 left Vibratory sensation nonreactive bilaterally Ankle reflexes weakly reactive bilaterally Restricted range of motion right ankle and right subtalar joint Hyperpigmentation right ankle Hammertoe 2-5 bilaterally Manual motor testing dorsi flexion, plantar flexion 5/5 bilaterally The toenails are elongated, hypertrophic, discolored, incurvated with tenderness to direct palpation Bleeding plantar callus sub-first right MPJ without any surrounding erythema or edema,remains closed after debridement   Assessment: Diabetic peripheral neuropathy Pre-ulcerative plantar callus right  Symptomatic mycotic toenails 6-10  Plan: Debridement toenails 6-10 mechanically and electrically without anybleeding Debride keratoses 1 without any bleeding Patient instructed to apply Silvadene cream or antibiotic ointment if any drainage is drainage is noted Wear custom orthotics with with additional padding to offload the plantar right first MPJ  Submits certification for replacement diabetic shoes Indications include diabetic peripheral neuropathy Pre-ulcerative callus Hammertoes bilaterally  Reappoint 2 months

## 2017-06-16 ENCOUNTER — Ambulatory Visit: Payer: Medicare Other | Admitting: Orthotics

## 2017-06-16 DIAGNOSIS — M1990 Unspecified osteoarthritis, unspecified site: Secondary | ICD-10-CM | POA: Diagnosis not present

## 2017-06-16 DIAGNOSIS — E1142 Type 2 diabetes mellitus with diabetic polyneuropathy: Secondary | ICD-10-CM

## 2017-06-16 DIAGNOSIS — L97511 Non-pressure chronic ulcer of other part of right foot limited to breakdown of skin: Secondary | ICD-10-CM

## 2017-06-16 DIAGNOSIS — N400 Enlarged prostate without lower urinary tract symptoms: Secondary | ICD-10-CM | POA: Diagnosis not present

## 2017-06-16 DIAGNOSIS — E1129 Type 2 diabetes mellitus with other diabetic kidney complication: Secondary | ICD-10-CM | POA: Diagnosis not present

## 2017-06-16 DIAGNOSIS — D510 Vitamin B12 deficiency anemia due to intrinsic factor deficiency: Secondary | ICD-10-CM | POA: Diagnosis not present

## 2017-06-16 DIAGNOSIS — L84 Corns and callosities: Secondary | ICD-10-CM

## 2017-06-16 DIAGNOSIS — N184 Chronic kidney disease, stage 4 (severe): Secondary | ICD-10-CM | POA: Diagnosis not present

## 2017-06-16 DIAGNOSIS — Z6831 Body mass index (BMI) 31.0-31.9, adult: Secondary | ICD-10-CM | POA: Diagnosis not present

## 2017-06-16 DIAGNOSIS — D631 Anemia in chronic kidney disease: Secondary | ICD-10-CM | POA: Diagnosis not present

## 2017-06-16 DIAGNOSIS — E559 Vitamin D deficiency, unspecified: Secondary | ICD-10-CM | POA: Diagnosis not present

## 2017-06-16 DIAGNOSIS — E11319 Type 2 diabetes mellitus with unspecified diabetic retinopathy without macular edema: Secondary | ICD-10-CM | POA: Diagnosis not present

## 2017-06-16 DIAGNOSIS — E668 Other obesity: Secondary | ICD-10-CM | POA: Diagnosis not present

## 2017-06-16 DIAGNOSIS — L97521 Non-pressure chronic ulcer of other part of left foot limited to breakdown of skin: Secondary | ICD-10-CM

## 2017-06-16 DIAGNOSIS — E7800 Pure hypercholesterolemia, unspecified: Secondary | ICD-10-CM | POA: Diagnosis not present

## 2017-06-16 DIAGNOSIS — I129 Hypertensive chronic kidney disease with stage 1 through stage 4 chronic kidney disease, or unspecified chronic kidney disease: Secondary | ICD-10-CM | POA: Diagnosis not present

## 2017-06-16 NOTE — Progress Notes (Signed)
Patient here to be evaluated measured for diabetic shoes. Patient doc here is Tuchman Patient PCP is Tisovac, Richard Patient presents with PN, HT, hx callus. Patient chose [facility] [id number]

## 2017-07-12 ENCOUNTER — Ambulatory Visit (INDEPENDENT_AMBULATORY_CARE_PROVIDER_SITE_OTHER): Payer: Medicare Other | Admitting: Orthotics

## 2017-07-12 DIAGNOSIS — L97521 Non-pressure chronic ulcer of other part of left foot limited to breakdown of skin: Secondary | ICD-10-CM

## 2017-07-12 DIAGNOSIS — E1142 Type 2 diabetes mellitus with diabetic polyneuropathy: Secondary | ICD-10-CM | POA: Diagnosis not present

## 2017-07-12 DIAGNOSIS — L84 Corns and callosities: Secondary | ICD-10-CM

## 2017-07-12 NOTE — Progress Notes (Signed)

## 2017-08-19 DIAGNOSIS — N2581 Secondary hyperparathyroidism of renal origin: Secondary | ICD-10-CM | POA: Diagnosis not present

## 2017-08-19 DIAGNOSIS — N189 Chronic kidney disease, unspecified: Secondary | ICD-10-CM | POA: Diagnosis not present

## 2017-08-19 DIAGNOSIS — N184 Chronic kidney disease, stage 4 (severe): Secondary | ICD-10-CM | POA: Diagnosis not present

## 2017-08-26 DIAGNOSIS — N184 Chronic kidney disease, stage 4 (severe): Secondary | ICD-10-CM | POA: Diagnosis not present

## 2017-08-26 DIAGNOSIS — N2581 Secondary hyperparathyroidism of renal origin: Secondary | ICD-10-CM | POA: Diagnosis not present

## 2017-08-26 DIAGNOSIS — D631 Anemia in chronic kidney disease: Secondary | ICD-10-CM | POA: Diagnosis not present

## 2017-08-26 DIAGNOSIS — I129 Hypertensive chronic kidney disease with stage 1 through stage 4 chronic kidney disease, or unspecified chronic kidney disease: Secondary | ICD-10-CM | POA: Diagnosis not present

## 2017-09-05 ENCOUNTER — Ambulatory Visit: Payer: Medicare Other | Admitting: Podiatry

## 2017-09-06 ENCOUNTER — Ambulatory Visit (INDEPENDENT_AMBULATORY_CARE_PROVIDER_SITE_OTHER): Payer: Medicare Other | Admitting: Podiatry

## 2017-09-06 ENCOUNTER — Encounter: Payer: Self-pay | Admitting: Podiatry

## 2017-09-06 DIAGNOSIS — B351 Tinea unguium: Secondary | ICD-10-CM

## 2017-09-06 DIAGNOSIS — L84 Corns and callosities: Secondary | ICD-10-CM

## 2017-09-06 DIAGNOSIS — E1142 Type 2 diabetes mellitus with diabetic polyneuropathy: Secondary | ICD-10-CM | POA: Diagnosis not present

## 2017-09-06 DIAGNOSIS — M79675 Pain in left toe(s): Secondary | ICD-10-CM

## 2017-09-06 DIAGNOSIS — M79674 Pain in right toe(s): Secondary | ICD-10-CM

## 2017-09-06 NOTE — Progress Notes (Addendum)
Complaint:  Visit Type: Patient returns to my office for continued preventative foot care services. Complaint: Patient states" my nails have grown long and thick and become painful to walk and wear shoes" Patient has been diagnosed with DM with no foot complications. The patient presents for preventative foot care services. No changes to ROS.  Patient says he has calluses under the ball of both feet.  Podiatric Exam: Vascular: dorsalis pedis and posterior tibial pulses are palpable bilateral. Capillary return is immediate. Temperature gradient is WNL. Skin turgor WNL  Sensorium: Diminished  Semmes Weinstein monofilament test. Normal tactile sensation bilaterally. Nail Exam: Pt has thick disfigured discolored nails with subungual debris noted bilateral entire nail hallux through fifth toenails Ulcer Exam: There are preulcerous callus noted under 1st MPJ  B/L.  Lesion under right foot has callus associated with skin lesion. Orthopedic Exam: Muscle tone and strength are WNL. No limitations in general ROM. No crepitus or effusions noted. Foot type and digits show no abnormalities. Bony prominences are unremarkable. Skin: No Porokeratosis. No infection or ulcers  Diagnosis:  Onychomycosis, , Pain in right toe, pain in left toes Preulcerous callus sub 1st MPJ  B/L  Treatment & Plan Procedures and Treatment: Consent by patient was obtained for treatment procedures.   Debridement of mycotic and hypertrophic toenails, 1 through 5 bilateral and clearing of subungual debris. No ulceration, no infection noted. Debridement of callus sub 1st MPJ right.  Patient wears pads for his preulcerous callus  B/L.  ABN signed for 2019. Return Visit-Office Procedure: Patient instructed to return to the office for a follow up visit 3 months for continued evaluation and treatment.    Gardiner Barefoot DPM

## 2017-11-30 ENCOUNTER — Ambulatory Visit (INDEPENDENT_AMBULATORY_CARE_PROVIDER_SITE_OTHER): Payer: Medicare Other | Admitting: Podiatry

## 2017-11-30 ENCOUNTER — Encounter: Payer: Self-pay | Admitting: Podiatry

## 2017-11-30 DIAGNOSIS — E1142 Type 2 diabetes mellitus with diabetic polyneuropathy: Secondary | ICD-10-CM

## 2017-11-30 DIAGNOSIS — B351 Tinea unguium: Secondary | ICD-10-CM | POA: Diagnosis not present

## 2017-11-30 DIAGNOSIS — L84 Corns and callosities: Secondary | ICD-10-CM | POA: Diagnosis not present

## 2017-11-30 DIAGNOSIS — M79675 Pain in left toe(s): Secondary | ICD-10-CM | POA: Diagnosis not present

## 2017-11-30 DIAGNOSIS — M79674 Pain in right toe(s): Secondary | ICD-10-CM | POA: Diagnosis not present

## 2017-11-30 NOTE — Progress Notes (Signed)
Complaint:  Visit Type: Patient returns to my office for continued preventative foot care services. Complaint: Patient states" my nails have grown long and thick and become painful to walk and wear shoes" Patient has been diagnosed with DM with no foot complications. The patient presents for preventative foot care services. No changes to ROS.  Patient says he has calluses under the ball of both feet.  Podiatric Exam: Vascular: dorsalis pedis and posterior tibial pulses are palpable bilateral. Capillary return is immediate. Temperature gradient is WNL. Skin turgor WNL  Sensorium: Diminished  Semmes Weinstein monofilament test. Normal tactile sensation bilaterally. Nail Exam: Pt has thick disfigured discolored nails with subungual debris noted bilateral entire nail hallux through fifth toenails Ulcer Exam: There are preulcerous callus noted under 1st MPJ  B/L.  Lesion under right foot has callus associated with skin lesion. Closed hemorrhagic callus. Orthopedic Exam: Muscle tone and strength are WNL. No limitations in general ROM. No crepitus or effusions noted. Foot type and digits show no abnormalities. Bony prominences are unremarkable. Skin: No Porokeratosis. No infection or ulcers.  Patient applied salicylic acid to callus sub 1st MPJ  Left foot. Diagnosis:  Onychomycosis, , Pain in right toe, pain in left toes Preulcerous callus sub 1st MPJ  B/L  Treatment & Plan Procedures and Treatment: Consent by patient was obtained for treatment procedures.   Debridement of mycotic and hypertrophic toenails, 1 through 5 bilateral and clearing of subungual debris. No ulceration, no infection noted. Debridement of callus sub 1st MPJ right.  Patient wears pads for his preulcerous callus  B/L.  ABN signed for 2019. Return Visit-Office Procedure: Patient instructed to return to the office for a follow up visit 3 months for continued evaluation and treatment.    Gardiner Barefoot DPM

## 2017-12-13 DIAGNOSIS — E7800 Pure hypercholesterolemia, unspecified: Secondary | ICD-10-CM | POA: Diagnosis not present

## 2017-12-13 DIAGNOSIS — E113553 Type 2 diabetes mellitus with stable proliferative diabetic retinopathy, bilateral: Secondary | ICD-10-CM | POA: Diagnosis not present

## 2017-12-13 DIAGNOSIS — D510 Vitamin B12 deficiency anemia due to intrinsic factor deficiency: Secondary | ICD-10-CM | POA: Diagnosis not present

## 2017-12-13 DIAGNOSIS — E559 Vitamin D deficiency, unspecified: Secondary | ICD-10-CM | POA: Diagnosis not present

## 2017-12-13 DIAGNOSIS — Z125 Encounter for screening for malignant neoplasm of prostate: Secondary | ICD-10-CM | POA: Diagnosis not present

## 2017-12-13 DIAGNOSIS — R82998 Other abnormal findings in urine: Secondary | ICD-10-CM | POA: Diagnosis not present

## 2017-12-20 DIAGNOSIS — Z6831 Body mass index (BMI) 31.0-31.9, adult: Secondary | ICD-10-CM | POA: Diagnosis not present

## 2017-12-20 DIAGNOSIS — D692 Other nonthrombocytopenic purpura: Secondary | ICD-10-CM | POA: Diagnosis not present

## 2017-12-20 DIAGNOSIS — N184 Chronic kidney disease, stage 4 (severe): Secondary | ICD-10-CM | POA: Diagnosis not present

## 2017-12-20 DIAGNOSIS — Z0000 Encounter for general adult medical examination without abnormal findings: Secondary | ICD-10-CM | POA: Diagnosis not present

## 2017-12-20 DIAGNOSIS — N400 Enlarged prostate without lower urinary tract symptoms: Secondary | ICD-10-CM | POA: Diagnosis not present

## 2017-12-20 DIAGNOSIS — D631 Anemia in chronic kidney disease: Secondary | ICD-10-CM | POA: Diagnosis not present

## 2017-12-20 DIAGNOSIS — I129 Hypertensive chronic kidney disease with stage 1 through stage 4 chronic kidney disease, or unspecified chronic kidney disease: Secondary | ICD-10-CM | POA: Diagnosis not present

## 2017-12-20 DIAGNOSIS — N390 Urinary tract infection, site not specified: Secondary | ICD-10-CM | POA: Diagnosis not present

## 2017-12-20 DIAGNOSIS — E113553 Type 2 diabetes mellitus with stable proliferative diabetic retinopathy, bilateral: Secondary | ICD-10-CM | POA: Diagnosis not present

## 2017-12-20 DIAGNOSIS — Z1389 Encounter for screening for other disorder: Secondary | ICD-10-CM | POA: Diagnosis not present

## 2017-12-20 DIAGNOSIS — E1129 Type 2 diabetes mellitus with other diabetic kidney complication: Secondary | ICD-10-CM | POA: Diagnosis not present

## 2017-12-20 DIAGNOSIS — E7800 Pure hypercholesterolemia, unspecified: Secondary | ICD-10-CM | POA: Diagnosis not present

## 2017-12-27 DIAGNOSIS — D631 Anemia in chronic kidney disease: Secondary | ICD-10-CM | POA: Diagnosis not present

## 2017-12-27 DIAGNOSIS — I129 Hypertensive chronic kidney disease with stage 1 through stage 4 chronic kidney disease, or unspecified chronic kidney disease: Secondary | ICD-10-CM | POA: Diagnosis not present

## 2017-12-27 DIAGNOSIS — N184 Chronic kidney disease, stage 4 (severe): Secondary | ICD-10-CM | POA: Diagnosis not present

## 2017-12-27 DIAGNOSIS — N2581 Secondary hyperparathyroidism of renal origin: Secondary | ICD-10-CM | POA: Diagnosis not present

## 2018-01-13 DIAGNOSIS — H25012 Cortical age-related cataract, left eye: Secondary | ICD-10-CM | POA: Diagnosis not present

## 2018-01-13 DIAGNOSIS — E119 Type 2 diabetes mellitus without complications: Secondary | ICD-10-CM | POA: Diagnosis not present

## 2018-01-13 DIAGNOSIS — H43392 Other vitreous opacities, left eye: Secondary | ICD-10-CM | POA: Diagnosis not present

## 2018-01-13 DIAGNOSIS — H2512 Age-related nuclear cataract, left eye: Secondary | ICD-10-CM | POA: Diagnosis not present

## 2018-03-01 ENCOUNTER — Encounter: Payer: Self-pay | Admitting: Podiatry

## 2018-03-01 ENCOUNTER — Ambulatory Visit (INDEPENDENT_AMBULATORY_CARE_PROVIDER_SITE_OTHER): Payer: Medicare Other | Admitting: Podiatry

## 2018-03-01 DIAGNOSIS — B351 Tinea unguium: Secondary | ICD-10-CM

## 2018-03-01 DIAGNOSIS — M79675 Pain in left toe(s): Secondary | ICD-10-CM | POA: Diagnosis not present

## 2018-03-01 DIAGNOSIS — L97511 Non-pressure chronic ulcer of other part of right foot limited to breakdown of skin: Secondary | ICD-10-CM

## 2018-03-01 DIAGNOSIS — M79674 Pain in right toe(s): Secondary | ICD-10-CM | POA: Diagnosis not present

## 2018-03-01 DIAGNOSIS — E1142 Type 2 diabetes mellitus with diabetic polyneuropathy: Secondary | ICD-10-CM

## 2018-03-01 NOTE — Progress Notes (Signed)
Complaint:  Visit Type: Patient returns to my office for continued preventative foot care services. Complaint: Patient states" my nails have grown long and thick and become painful to walk and wear shoes" Patient has been diagnosed with DM with no foot complications. The patient presents for preventative foot care services. No changes to ROS.  Patient says he has a painful callus under his right foot when he walks.   Podiatric Exam: Vascular: dorsalis pedis and posterior tibial pulses are palpable bilateral. Capillary return is immediate. Temperature gradient is WNL. Skin turgor WNL  Sensorium: Diminished  Semmes Weinstein monofilament test. Normal tactile sensation bilaterally. Nail Exam: Pt has thick disfigured discolored nails with subungual debris noted bilateral entire nail hallux through fifth toenails Ulcer Exam: . There is a 5 mm x 5 mm ulcer, sub-first MPJ right foot.  There is significant callus noted around the ulcer.  No evidence of any drainage, redness or infection.   Orthopedic Exam: Muscle tone and strength are WNL. No limitations in general ROM. No crepitus or effusions noted. Foot type and digits show no abnormalities. Plantarflexed first metatarsal right foot. Skin: No Porokeratosis. No infection or ulcers.  Patient applied salicylic acid to callus sub 1st MPJ  Left foot. Diagnosis:  Onychomycosis, , Pain in right toe, pain in left toes Preulcerous callus sub 1st MPJ  B/L  Treatment & Plan Procedures and Treatment: Consent by patient was obtained for treatment procedures.   Debridement of mycotic and hypertrophic toenails, 1 through 5 bilateral and clearing of subungual debris.  . Debridement of the ulcer, sub-first metatarsal right foot.  Neosporin and dry sterile dressing was applied.  Home instructions given.  Patient is to return to the office in 2 weeks for further evaluation of the ulcer.  Patient  Is to return to the office for preventative foot care services both feet. ABN  signed for 2019. Return Visit-Office Procedure: Patient instructed to return to the office for a follow up visit 3 months for continued evaluation and treatment.    Gardiner Barefoot DPM

## 2018-03-17 ENCOUNTER — Ambulatory Visit (INDEPENDENT_AMBULATORY_CARE_PROVIDER_SITE_OTHER): Payer: Medicare Other | Admitting: Podiatry

## 2018-03-17 ENCOUNTER — Encounter: Payer: Self-pay | Admitting: Podiatry

## 2018-03-17 DIAGNOSIS — E1142 Type 2 diabetes mellitus with diabetic polyneuropathy: Secondary | ICD-10-CM | POA: Diagnosis not present

## 2018-03-17 DIAGNOSIS — L97511 Non-pressure chronic ulcer of other part of right foot limited to breakdown of skin: Secondary | ICD-10-CM

## 2018-03-17 NOTE — Progress Notes (Signed)
Complaint:  Visit Type: Patient returns to my office for continued preventative foot care services. Complaint: Patient states the skin lesion on his right foot has improved.  He has been soaking at home and using off loading on his insoles.  He wears a bandage with no drainage noted.  He presents to the office for evaluation of open skin lesion.  Podiatric Exam: Vascular: dorsalis pedis and posterior tibial pulses are palpable bilateral. Capillary return is immediate. Temperature gradient is WNL. Skin turgor WNL  Sensorium: Diminished  Semmes Weinstein monofilament test. Normal tactile sensation bilaterally. Nail Exam: Pt has thick disfigured discolored nails with subungual debris noted bilateral entire nail hallux through fifth toenails Ulcer Exam: . Open skin lesion has closed with no redness or drainage noted. Orthopedic Exam: Muscle tone and strength are WNL. No limitations in general ROM. No crepitus or effusions noted. Foot type and digits show no abnormalities. Plantarflexed first metatarsal right foot. Skin: No Porokeratosis. No infection or ulcers.    Diagnosis:   Preulcerous callus sub 1st MPJ  B/L  Treatment & Plan Procedures and Treatment: Consent by patient was obtained for treatment procedures.     . Debridement of the ulcer, sub-first metatarsal right foot.  Continue to wear padded insole.  Patient  Is to return to the office for preventative foot care services both feet.in 10 weeks.   ABN signed for 2019. Return Visit-Office Procedure: Patient instructed to return to the office for a follow up visit 3 months for continued evaluation and treatment.    Gardiner Barefoot DPM

## 2018-04-03 DIAGNOSIS — D485 Neoplasm of uncertain behavior of skin: Secondary | ICD-10-CM | POA: Diagnosis not present

## 2018-04-03 DIAGNOSIS — L570 Actinic keratosis: Secondary | ICD-10-CM | POA: Diagnosis not present

## 2018-04-03 DIAGNOSIS — L821 Other seborrheic keratosis: Secondary | ICD-10-CM | POA: Diagnosis not present

## 2018-04-03 DIAGNOSIS — D0439 Carcinoma in situ of skin of other parts of face: Secondary | ICD-10-CM | POA: Diagnosis not present

## 2018-04-03 DIAGNOSIS — Z85828 Personal history of other malignant neoplasm of skin: Secondary | ICD-10-CM | POA: Diagnosis not present

## 2018-04-03 DIAGNOSIS — D044 Carcinoma in situ of skin of scalp and neck: Secondary | ICD-10-CM | POA: Diagnosis not present

## 2018-04-03 DIAGNOSIS — D225 Melanocytic nevi of trunk: Secondary | ICD-10-CM | POA: Diagnosis not present

## 2018-04-05 DIAGNOSIS — Z23 Encounter for immunization: Secondary | ICD-10-CM | POA: Diagnosis not present

## 2018-04-18 DIAGNOSIS — C44329 Squamous cell carcinoma of skin of other parts of face: Secondary | ICD-10-CM | POA: Diagnosis not present

## 2018-04-18 DIAGNOSIS — D0439 Carcinoma in situ of skin of other parts of face: Secondary | ICD-10-CM | POA: Diagnosis not present

## 2018-04-18 DIAGNOSIS — Z85828 Personal history of other malignant neoplasm of skin: Secondary | ICD-10-CM | POA: Diagnosis not present

## 2018-04-25 DIAGNOSIS — N2581 Secondary hyperparathyroidism of renal origin: Secondary | ICD-10-CM | POA: Diagnosis not present

## 2018-04-25 DIAGNOSIS — D631 Anemia in chronic kidney disease: Secondary | ICD-10-CM | POA: Diagnosis not present

## 2018-04-25 DIAGNOSIS — N184 Chronic kidney disease, stage 4 (severe): Secondary | ICD-10-CM | POA: Diagnosis not present

## 2018-04-25 DIAGNOSIS — N189 Chronic kidney disease, unspecified: Secondary | ICD-10-CM | POA: Diagnosis not present

## 2018-05-02 DIAGNOSIS — N2581 Secondary hyperparathyroidism of renal origin: Secondary | ICD-10-CM | POA: Diagnosis not present

## 2018-05-02 DIAGNOSIS — D631 Anemia in chronic kidney disease: Secondary | ICD-10-CM | POA: Diagnosis not present

## 2018-05-02 DIAGNOSIS — I129 Hypertensive chronic kidney disease with stage 1 through stage 4 chronic kidney disease, or unspecified chronic kidney disease: Secondary | ICD-10-CM | POA: Diagnosis not present

## 2018-05-02 DIAGNOSIS — N184 Chronic kidney disease, stage 4 (severe): Secondary | ICD-10-CM | POA: Diagnosis not present

## 2018-05-10 ENCOUNTER — Other Ambulatory Visit (HOSPITAL_COMMUNITY): Payer: Self-pay | Admitting: *Deleted

## 2018-05-10 NOTE — Discharge Instructions (Signed)
Epoetin Alfa injection °What is this medicine? °EPOETIN ALFA (e POE e tin AL fa) helps your body make more red blood cells. This medicine is used to treat anemia caused by chronic kidney failure, cancer chemotherapy, or HIV-therapy. It may also be used before surgery if you have anemia. °This medicine may be used for other purposes; ask your health care provider or pharmacist if you have questions. °COMMON BRAND NAME(S): Epogen, Procrit °What should I tell my health care provider before I take this medicine? °They need to know if you have any of these conditions: °-blood clotting disorders °-cancer patient not on chemotherapy °-cystic fibrosis °-heart disease, such as angina or heart failure °-hemoglobin level of 12 g/dL or greater °-high blood pressure °-low levels of folate, iron, or vitamin B12 °-seizures °-an unusual or allergic reaction to erythropoietin, albumin, benzyl alcohol, hamster proteins, other medicines, foods, dyes, or preservatives °-pregnant or trying to get pregnant °-breast-feeding °How should I use this medicine? °This medicine is for injection into a vein or under the skin. It is usually given by a health care professional in a hospital or clinic setting. °If you get this medicine at home, you will be taught how to prepare and give this medicine. Use exactly as directed. Take your medicine at regular intervals. Do not take your medicine more often than directed. °It is important that you put your used needles and syringes in a special sharps container. Do not put them in a trash can. If you do not have a sharps container, call your pharmacist or healthcare provider to get one. °A special MedGuide will be given to you by the pharmacist with each prescription and refill. Be sure to read this information carefully each time. °Talk to your pediatrician regarding the use of this medicine in children. While this drug may be prescribed for selected conditions, precautions do apply. °Overdosage: If you  think you have taken too much of this medicine contact a poison control center or emergency room at once. °NOTE: This medicine is only for you. Do not share this medicine with others. °What if I miss a dose? °If you miss a dose, take it as soon as you can. If it is almost time for your next dose, take only that dose. Do not take double or extra doses. °What may interact with this medicine? °Do not take this medicine with any of the following medications: °-darbepoetin alfa °This list may not describe all possible interactions. Give your health care provider a list of all the medicines, herbs, non-prescription drugs, or dietary supplements you use. Also tell them if you smoke, drink alcohol, or use illegal drugs. Some items may interact with your medicine. °What should I watch for while using this medicine? °Your condition will be monitored carefully while you are receiving this medicine. °You may need blood work done while you are taking this medicine. °What side effects may I notice from receiving this medicine? °Side effects that you should report to your doctor or health care professional as soon as possible: °-allergic reactions like skin rash, itching or hives, swelling of the face, lips, or tongue °-breathing problems °-changes in vision °-chest pain °-confusion, trouble speaking or understanding °-feeling faint or lightheaded, falls °-high blood pressure °-muscle aches or pains °-pain, swelling, warmth in the leg °-rapid weight gain °-severe headaches °-sudden numbness or weakness of the face, arm or leg °-trouble walking, dizziness, loss of balance or coordination °-seizures (convulsions) °-swelling of the ankles, feet, hands °-unusually weak or tired °  Side effects that usually do not require medical attention (report to your doctor or health care professional if they continue or are bothersome): °-diarrhea °-fever, chills (flu-like symptoms) °-headaches °-nausea, vomiting °-redness, stinging, or swelling at  site where injected °This list may not describe all possible side effects. Call your doctor for medical advice about side effects. You may report side effects to FDA at 1-800-FDA-1088. °Where should I keep my medicine? °Keep out of the reach of children. °Store in a refrigerator between 2 and 8 degrees C (36 and 46 degrees F). Do not freeze or shake. Throw away any unused portion if using a single-dose vial. Multi-dose vials can be kept in the refrigerator for up to 21 days after the initial dose. Throw away unused medicine. °NOTE: This sheet is a summary. It may not cover all possible information. If you have questions about this medicine, talk to your doctor, pharmacist, or health care provider. °© 2018 [employer] (2016-02-09 19:42:31) ° °

## 2018-05-11 ENCOUNTER — Encounter (HOSPITAL_COMMUNITY)
Admission: RE | Admit: 2018-05-11 | Discharge: 2018-05-11 | Disposition: A | Discharge status: Home / Self Care | Payer: Medicare Other | Source: Ambulatory Visit | Attending: Nephrology | Admitting: Nephrology

## 2018-05-11 DIAGNOSIS — D631 Anemia in chronic kidney disease: Secondary | ICD-10-CM | POA: Diagnosis not present

## 2018-05-11 DIAGNOSIS — N184 Chronic kidney disease, stage 4 (severe): Secondary | ICD-10-CM | POA: Diagnosis not present

## 2018-05-11 LAB — POCT HEMOGLOBIN-HEMACUE: Hemoglobin: 10.2 g/dL — ABNORMAL LOW (ref 13.0–17.0)

## 2018-05-11 MED ORDER — EPOETIN ALFA-EPBX 10000 UNIT/ML IJ SOLN
10000.0000 [IU] | Freq: Once | INTRAMUSCULAR | Status: AC
  Administered 2018-05-11: 14:00:00 10000 [IU] via SUBCUTANEOUS
  Filled 2018-05-11: qty 1

## 2018-05-24 ENCOUNTER — Encounter: Payer: Self-pay | Admitting: Podiatry

## 2018-05-24 ENCOUNTER — Ambulatory Visit (INDEPENDENT_AMBULATORY_CARE_PROVIDER_SITE_OTHER): Payer: Medicare Other | Admitting: Podiatry

## 2018-05-24 ENCOUNTER — Other Ambulatory Visit (HOSPITAL_COMMUNITY): Payer: Self-pay | Admitting: *Deleted

## 2018-05-24 DIAGNOSIS — E1142 Type 2 diabetes mellitus with diabetic polyneuropathy: Secondary | ICD-10-CM | POA: Diagnosis not present

## 2018-05-24 DIAGNOSIS — L97511 Non-pressure chronic ulcer of other part of right foot limited to breakdown of skin: Secondary | ICD-10-CM | POA: Diagnosis not present

## 2018-05-24 DIAGNOSIS — M79674 Pain in right toe(s): Secondary | ICD-10-CM | POA: Diagnosis not present

## 2018-05-24 DIAGNOSIS — M79675 Pain in left toe(s): Secondary | ICD-10-CM

## 2018-05-24 DIAGNOSIS — B351 Tinea unguium: Secondary | ICD-10-CM

## 2018-05-24 NOTE — Progress Notes (Signed)
This patient presents to the office for preventative foot care services.  He says the nails are painful walking and wearing shoes.  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  He has been treated with diabetic shoes previously but the ulcer persists.  He says there is still callus formation but minimal drainage.  Marland Kitchen  He presents to the office for continued evaluation and treatment.  General Appearance  Alert, conversant and in no acute stress.  Vascular  Dorsalis pedis and posterior tibial  pulses are palpable  bilaterally.  Capillary return is within normal limits  bilaterally. Temperature is within normal limits  bilaterally.  Neurologic  Senn-Weinstein monofilament wire test diminished  bilaterally. Muscle power within normal limits bilaterally.  Nails Thick disfigured discolored nails with subungual debris  from hallux to fifth toes bilaterally. No evidence of bacterial infection or drainage bilaterally.  Orthopedic  No limitations of motion  feet .  No crepitus or effusions noted.  Plantar flexed first metatarsal right foot.    Skin  normotropic skin with no porokeratosis noted bilaterally.  Chronic ulcer sub 1 right foot.  Necrotic tissue noted encircling ulcer sub 1 right foot.  No signs of redness or swelling or drainage noted.  Ulcer measures 5 x 5 mm.  Onychomycosis   Ulcer sub 1 right foot.  ROV.  Debridement of nails  X 10.  Debride necrotic tissue surrounding chronic ulcer right foot.  Neosporin/DSD.  Told to perform soaks at home .  RTC 10 weeks.   Gardiner Barefoot DPM

## 2018-05-25 ENCOUNTER — Ambulatory Visit (HOSPITAL_COMMUNITY)
Admission: RE | Admit: 2018-05-25 | Discharge: 2018-05-25 | Disposition: A | Discharge status: Home / Self Care | Payer: Medicare Other | Source: Ambulatory Visit | Attending: Nephrology | Admitting: Nephrology

## 2018-05-25 DIAGNOSIS — N184 Chronic kidney disease, stage 4 (severe): Secondary | ICD-10-CM | POA: Diagnosis not present

## 2018-05-25 DIAGNOSIS — D631 Anemia in chronic kidney disease: Secondary | ICD-10-CM | POA: Diagnosis not present

## 2018-05-25 LAB — CBC WITH DIFFERENTIAL/PLATELET
Abs Immature Granulocytes: 0.01 10*3/uL (ref 0.00–0.07)
BASOS ABS: 0 10*3/uL (ref 0.0–0.1)
Basophils Relative: 1 %
EOS PCT: 2 %
Eosinophils Absolute: 0.1 10*3/uL (ref 0.0–0.5)
HEMATOCRIT: 33.7 % — AB (ref 39.0–52.0)
HEMOGLOBIN: 9.9 g/dL — AB (ref 13.0–17.0)
Immature Granulocytes: 0 %
LYMPHS ABS: 0.5 10*3/uL — AB (ref 0.7–4.0)
LYMPHS PCT: 9 %
MCH: 31.3 pg (ref 26.0–34.0)
MCHC: 29.4 g/dL — AB (ref 30.0–36.0)
MCV: 106.6 fL — ABNORMAL HIGH (ref 80.0–100.0)
MONO ABS: 0.5 10*3/uL (ref 0.1–1.0)
Monocytes Relative: 9 %
NRBC: 0 % (ref 0.0–0.2)
Neutro Abs: 4.4 10*3/uL (ref 1.7–7.7)
Neutrophils Relative %: 79 %
Platelets: 177 10*3/uL (ref 150–400)
RBC: 3.16 MIL/uL — ABNORMAL LOW (ref 4.22–5.81)
RDW: 13.5 % (ref 11.5–15.5)
WBC: 5.6 10*3/uL (ref 4.0–10.5)

## 2018-05-25 LAB — IRON AND TIBC
IRON: 77 ug/dL (ref 45–182)
SATURATION RATIOS: 28 % (ref 17.9–39.5)
TIBC: 280 ug/dL (ref 250–450)
UIBC: 203 ug/dL

## 2018-05-25 LAB — FERRITIN: Ferritin: 85 ng/mL (ref 24–336)

## 2018-05-25 MED ORDER — EPOETIN ALFA-EPBX 10000 UNIT/ML IJ SOLN
10000.0000 [IU] | Freq: Once | INTRAMUSCULAR | Status: AC
  Administered 2018-05-25: 10000 [IU] via SUBCUTANEOUS
  Filled 2018-05-25: qty 1

## 2018-06-05 ENCOUNTER — Ambulatory Visit: Payer: Medicare Other | Admitting: Orthotics

## 2018-06-05 DIAGNOSIS — L97511 Non-pressure chronic ulcer of other part of right foot limited to breakdown of skin: Secondary | ICD-10-CM

## 2018-06-05 DIAGNOSIS — E1142 Type 2 diabetes mellitus with diabetic polyneuropathy: Secondary | ICD-10-CM

## 2018-06-05 NOTE — Progress Notes (Signed)

## 2018-06-08 ENCOUNTER — Ambulatory Visit (HOSPITAL_COMMUNITY)
Admission: RE | Admit: 2018-06-08 | Discharge: 2018-06-08 | Disposition: A | Discharge status: Home / Self Care | Payer: Medicare Other | Source: Ambulatory Visit | Attending: Nephrology | Admitting: Nephrology

## 2018-06-08 DIAGNOSIS — D631 Anemia in chronic kidney disease: Secondary | ICD-10-CM | POA: Diagnosis not present

## 2018-06-08 DIAGNOSIS — Z5181 Encounter for therapeutic drug level monitoring: Secondary | ICD-10-CM | POA: Diagnosis not present

## 2018-06-08 DIAGNOSIS — N184 Chronic kidney disease, stage 4 (severe): Secondary | ICD-10-CM | POA: Insufficient documentation

## 2018-06-08 DIAGNOSIS — Z79899 Other long term (current) drug therapy: Secondary | ICD-10-CM | POA: Diagnosis not present

## 2018-06-08 LAB — POCT HEMOGLOBIN-HEMACUE: HEMOGLOBIN: 10.8 g/dL — AB (ref 13.0–17.0)

## 2018-06-08 MED ORDER — EPOETIN ALFA-EPBX 10000 UNIT/ML IJ SOLN
10000.0000 [IU] | Freq: Once | INTRAMUSCULAR | Status: AC
  Administered 2018-06-08: 10000 [IU] via SUBCUTANEOUS
  Filled 2018-06-08: qty 1

## 2018-06-21 ENCOUNTER — Encounter: Payer: Self-pay | Admitting: Nephrology

## 2018-06-21 DIAGNOSIS — N184 Chronic kidney disease, stage 4 (severe): Secondary | ICD-10-CM | POA: Diagnosis not present

## 2018-06-21 DIAGNOSIS — I129 Hypertensive chronic kidney disease with stage 1 through stage 4 chronic kidney disease, or unspecified chronic kidney disease: Secondary | ICD-10-CM | POA: Diagnosis not present

## 2018-06-21 DIAGNOSIS — E113553 Type 2 diabetes mellitus with stable proliferative diabetic retinopathy, bilateral: Secondary | ICD-10-CM | POA: Diagnosis not present

## 2018-06-21 DIAGNOSIS — E7800 Pure hypercholesterolemia, unspecified: Secondary | ICD-10-CM | POA: Diagnosis not present

## 2018-06-21 DIAGNOSIS — D692 Other nonthrombocytopenic purpura: Secondary | ICD-10-CM | POA: Diagnosis not present

## 2018-06-21 DIAGNOSIS — E1129 Type 2 diabetes mellitus with other diabetic kidney complication: Secondary | ICD-10-CM | POA: Diagnosis not present

## 2018-06-21 DIAGNOSIS — D631 Anemia in chronic kidney disease: Secondary | ICD-10-CM | POA: Diagnosis not present

## 2018-06-21 DIAGNOSIS — R808 Other proteinuria: Secondary | ICD-10-CM | POA: Diagnosis not present

## 2018-06-21 DIAGNOSIS — D638 Anemia in other chronic diseases classified elsewhere: Secondary | ICD-10-CM | POA: Insufficient documentation

## 2018-06-21 DIAGNOSIS — I10 Essential (primary) hypertension: Secondary | ICD-10-CM | POA: Diagnosis not present

## 2018-06-21 DIAGNOSIS — E559 Vitamin D deficiency, unspecified: Secondary | ICD-10-CM | POA: Diagnosis not present

## 2018-06-21 DIAGNOSIS — Z6830 Body mass index (BMI) 30.0-30.9, adult: Secondary | ICD-10-CM | POA: Diagnosis not present

## 2018-06-21 DIAGNOSIS — E11319 Type 2 diabetes mellitus with unspecified diabetic retinopathy without macular edema: Secondary | ICD-10-CM | POA: Diagnosis not present

## 2018-06-22 ENCOUNTER — Ambulatory Visit (HOSPITAL_COMMUNITY)
Admission: RE | Admit: 2018-06-22 | Discharge: 2018-06-22 | Disposition: A | Discharge status: Home / Self Care | Payer: Medicare Other | Source: Ambulatory Visit | Attending: Nephrology | Admitting: Nephrology

## 2018-06-22 VITALS — BP 125/53 | HR 81 | Temp 98.1°F | Resp 20

## 2018-06-22 DIAGNOSIS — D638 Anemia in other chronic diseases classified elsewhere: Secondary | ICD-10-CM | POA: Insufficient documentation

## 2018-06-22 LAB — CBC WITH DIFFERENTIAL/PLATELET
Abs Immature Granulocytes: 0.02 10*3/uL (ref 0.00–0.07)
BASOS ABS: 0 10*3/uL (ref 0.0–0.1)
Basophils Relative: 1 %
EOS ABS: 0.1 10*3/uL (ref 0.0–0.5)
EOS PCT: 1 %
HCT: 34.8 % — ABNORMAL LOW (ref 39.0–52.0)
Hemoglobin: 10.7 g/dL — ABNORMAL LOW (ref 13.0–17.0)
Immature Granulocytes: 0 %
LYMPHS PCT: 8 %
Lymphs Abs: 0.4 10*3/uL — ABNORMAL LOW (ref 0.7–4.0)
MCH: 32.4 pg (ref 26.0–34.0)
MCHC: 30.7 g/dL (ref 30.0–36.0)
MCV: 105.5 fL — ABNORMAL HIGH (ref 80.0–100.0)
Monocytes Absolute: 0.3 10*3/uL (ref 0.1–1.0)
Monocytes Relative: 6 %
NRBC: 0 % (ref 0.0–0.2)
Neutro Abs: 4 10*3/uL (ref 1.7–7.7)
Neutrophils Relative %: 84 %
Platelets: 160 10*3/uL (ref 150–400)
RBC: 3.3 MIL/uL — AB (ref 4.22–5.81)
RDW: 13.2 % (ref 11.5–15.5)
WBC: 4.8 10*3/uL (ref 4.0–10.5)

## 2018-06-22 LAB — FERRITIN: FERRITIN: 79 ng/mL (ref 24–336)

## 2018-06-22 LAB — IRON AND TIBC
Iron: 59 ug/dL (ref 45–182)
SATURATION RATIOS: 22 % (ref 17.9–39.5)
TIBC: 263 ug/dL (ref 250–450)
UIBC: 204 ug/dL

## 2018-06-22 LAB — POCT HEMOGLOBIN-HEMACUE: HEMOGLOBIN: 11.4 g/dL — AB (ref 13.0–17.0)

## 2018-06-22 MED ORDER — EPOETIN ALFA-EPBX 10000 UNIT/ML IJ SOLN
10000.0000 [IU] | INTRAMUSCULAR | Status: DC
  Administered 2018-06-22: 10000 [IU] via SUBCUTANEOUS
  Filled 2018-06-22: qty 1

## 2018-06-23 ENCOUNTER — Telehealth: Payer: Self-pay | Admitting: Podiatry

## 2018-06-23 NOTE — Telephone Encounter (Signed)
Left message for pt that the diabetic shoe paperwork has expired and I need to know when he seen Dr Osborne Casco or if he has an appt soon.

## 2018-06-26 NOTE — Telephone Encounter (Signed)
Pt stopped in and said he just seen Dr Osborne Casco on 12.18.19 and I explained that I would have to resend the paperwork to them to get it updated and I would call him when we get it back.

## 2018-07-07 ENCOUNTER — Encounter (HOSPITAL_COMMUNITY)
Admission: RE | Admit: 2018-07-07 | Discharge: 2018-07-07 | Disposition: A | Discharge status: Home / Self Care | Payer: Medicare Other | Source: Ambulatory Visit | Attending: Nephrology | Admitting: Nephrology

## 2018-07-07 VITALS — BP 123/54 | HR 71 | Temp 97.6°F | Resp 20

## 2018-07-07 DIAGNOSIS — N184 Chronic kidney disease, stage 4 (severe): Secondary | ICD-10-CM | POA: Insufficient documentation

## 2018-07-07 DIAGNOSIS — D638 Anemia in other chronic diseases classified elsewhere: Secondary | ICD-10-CM

## 2018-07-07 DIAGNOSIS — D631 Anemia in chronic kidney disease: Secondary | ICD-10-CM | POA: Diagnosis not present

## 2018-07-07 LAB — POCT HEMOGLOBIN-HEMACUE: Hemoglobin: 11.3 g/dL — ABNORMAL LOW (ref 13.0–17.0)

## 2018-07-07 MED ORDER — EPOETIN ALFA-EPBX 10000 UNIT/ML IJ SOLN
10000.0000 [IU] | INTRAMUSCULAR | Status: DC
  Administered 2018-07-07: 10000 [IU] via SUBCUTANEOUS
  Filled 2018-07-07: qty 1

## 2018-07-11 ENCOUNTER — Ambulatory Visit (INDEPENDENT_AMBULATORY_CARE_PROVIDER_SITE_OTHER): Payer: Medicare Other | Admitting: Orthotics

## 2018-07-11 DIAGNOSIS — L84 Corns and callosities: Secondary | ICD-10-CM | POA: Diagnosis not present

## 2018-07-11 DIAGNOSIS — L97511 Non-pressure chronic ulcer of other part of right foot limited to breakdown of skin: Secondary | ICD-10-CM

## 2018-07-11 DIAGNOSIS — E1142 Type 2 diabetes mellitus with diabetic polyneuropathy: Secondary | ICD-10-CM | POA: Diagnosis not present

## 2018-07-11 DIAGNOSIS — M79674 Pain in right toe(s): Secondary | ICD-10-CM

## 2018-07-11 DIAGNOSIS — M79675 Pain in left toe(s): Secondary | ICD-10-CM

## 2018-07-11 DIAGNOSIS — B351 Tinea unguium: Secondary | ICD-10-CM

## 2018-07-11 NOTE — Progress Notes (Signed)

## 2018-07-21 ENCOUNTER — Encounter (HOSPITAL_COMMUNITY)
Admission: RE | Admit: 2018-07-21 | Discharge: 2018-07-21 | Disposition: A | Discharge status: Home / Self Care | Payer: Medicare Other | Source: Ambulatory Visit | Attending: Nephrology | Admitting: Nephrology

## 2018-07-21 VITALS — BP 136/62 | HR 72 | Temp 98.2°F | Resp 20

## 2018-07-21 DIAGNOSIS — D631 Anemia in chronic kidney disease: Secondary | ICD-10-CM | POA: Diagnosis not present

## 2018-07-21 DIAGNOSIS — D638 Anemia in other chronic diseases classified elsewhere: Secondary | ICD-10-CM

## 2018-07-21 DIAGNOSIS — N184 Chronic kidney disease, stage 4 (severe): Secondary | ICD-10-CM | POA: Diagnosis not present

## 2018-07-21 LAB — IRON AND TIBC
Iron: 64 ug/dL (ref 45–182)
Saturation Ratios: 24 % (ref 17.9–39.5)
TIBC: 270 ug/dL (ref 250–450)
UIBC: 206 ug/dL

## 2018-07-21 LAB — POCT HEMOGLOBIN-HEMACUE: Hemoglobin: 11.3 g/dL — ABNORMAL LOW (ref 13.0–17.0)

## 2018-07-21 LAB — CBC WITH DIFFERENTIAL/PLATELET
ABS IMMATURE GRANULOCYTES: 0.02 10*3/uL (ref 0.00–0.07)
Basophils Absolute: 0 10*3/uL (ref 0.0–0.1)
Basophils Relative: 1 %
Eosinophils Absolute: 0.1 10*3/uL (ref 0.0–0.5)
Eosinophils Relative: 2 %
HEMATOCRIT: 36.8 % — AB (ref 39.0–52.0)
HEMOGLOBIN: 11.2 g/dL — AB (ref 13.0–17.0)
Immature Granulocytes: 0 %
LYMPHS ABS: 0.4 10*3/uL — AB (ref 0.7–4.0)
Lymphocytes Relative: 8 %
MCH: 32 pg (ref 26.0–34.0)
MCHC: 30.4 g/dL (ref 30.0–36.0)
MCV: 105.1 fL — ABNORMAL HIGH (ref 80.0–100.0)
Monocytes Absolute: 0.4 10*3/uL (ref 0.1–1.0)
Monocytes Relative: 7 %
NEUTROS ABS: 4.1 10*3/uL (ref 1.7–7.7)
Neutrophils Relative %: 82 %
Platelets: 175 10*3/uL (ref 150–400)
RBC: 3.5 MIL/uL — ABNORMAL LOW (ref 4.22–5.81)
RDW: 13 % (ref 11.5–15.5)
WBC: 5 10*3/uL (ref 4.0–10.5)
nRBC: 0 % (ref 0.0–0.2)

## 2018-07-21 LAB — FERRITIN: Ferritin: 84 ng/mL (ref 24–336)

## 2018-07-21 MED ORDER — EPOETIN ALFA-EPBX 10000 UNIT/ML IJ SOLN
10000.0000 [IU] | INTRAMUSCULAR | Status: DC
  Administered 2018-07-21: 10000 [IU] via SUBCUTANEOUS
  Filled 2018-07-21: qty 1

## 2018-08-02 ENCOUNTER — Ambulatory Visit (INDEPENDENT_AMBULATORY_CARE_PROVIDER_SITE_OTHER): Payer: Medicare Other | Admitting: Podiatry

## 2018-08-02 ENCOUNTER — Encounter: Payer: Self-pay | Admitting: Podiatry

## 2018-08-02 DIAGNOSIS — L97511 Non-pressure chronic ulcer of other part of right foot limited to breakdown of skin: Secondary | ICD-10-CM

## 2018-08-02 DIAGNOSIS — B351 Tinea unguium: Secondary | ICD-10-CM | POA: Diagnosis not present

## 2018-08-02 DIAGNOSIS — M79675 Pain in left toe(s): Secondary | ICD-10-CM

## 2018-08-02 DIAGNOSIS — M79674 Pain in right toe(s): Secondary | ICD-10-CM | POA: Diagnosis not present

## 2018-08-02 DIAGNOSIS — E1142 Type 2 diabetes mellitus with diabetic polyneuropathy: Secondary | ICD-10-CM

## 2018-08-02 NOTE — Progress Notes (Signed)
This patient presents to the office for preventative foot care services.  He says the nails are painful walking and wearing shoes.  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  He has been treated with diabetic shoes previously but the ulcer persists.  He says there is still callus formation but minimal drainage. He presents to the office for preventative foot care services. [name]  He presents to the office for continued evaluation and treatment.  General Appearance  Alert, conversant and in no acute stress.  Vascular  Dorsalis pedis and posterior tibial  pulses are palpable  bilaterally.  Capillary return is within normal limits  bilaterally. Temperature is within normal limits  bilaterally.  Neurologic  Senn-Weinstein monofilament wire test diminished  bilaterally. Muscle power within normal limits bilaterally.  Nails Thick disfigured discolored nails with subungual debris  from hallux to fifth toes bilaterally. No evidence of bacterial infection or drainage bilaterally.  Orthopedic  No limitations of motion  feet .  No crepitus or effusions noted.  Plantar flexed first metatarsal right foot.    Skin  normotropic skin with no porokeratosis noted bilaterally.  Chronic ulcer sub 1 right foot.  Necrotic tissue noted encircling ulcer sub 1 right foot.  No signs of redness or swelling or drainage noted.  Ulcer measures 5 x 5 mm.  Onychomycosis   Ulcer sub 1 right foot.  ROV.  Debridement of nails  X 10.  Debride necrotic tissue surrounding chronic ulcer right foot.  Neosporin/DSD.  Told to perform soaks at home .  RTC 10 weeks.   Gardiner Barefoot DPM

## 2018-08-04 ENCOUNTER — Ambulatory Visit (HOSPITAL_COMMUNITY)
Admission: RE | Admit: 2018-08-04 | Discharge: 2018-08-04 | Disposition: A | Discharge status: Home / Self Care | Payer: Medicare Other | Source: Ambulatory Visit | Attending: Nephrology | Admitting: Nephrology

## 2018-08-04 VITALS — BP 103/44 | HR 73 | Temp 98.0°F | Resp 20

## 2018-08-04 DIAGNOSIS — D638 Anemia in other chronic diseases classified elsewhere: Secondary | ICD-10-CM | POA: Diagnosis not present

## 2018-08-04 LAB — POCT HEMOGLOBIN-HEMACUE: Hemoglobin: 11.6 g/dL — ABNORMAL LOW (ref 13.0–17.0)

## 2018-08-04 MED ORDER — EPOETIN ALFA-EPBX 10000 UNIT/ML IJ SOLN
10000.0000 [IU] | INTRAMUSCULAR | Status: DC
  Administered 2018-08-04: 10000 [IU] via SUBCUTANEOUS
  Filled 2018-08-04: qty 1

## 2018-08-18 ENCOUNTER — Ambulatory Visit (HOSPITAL_COMMUNITY)
Admission: RE | Admit: 2018-08-18 | Discharge: 2018-08-18 | Disposition: A | Discharge status: Home / Self Care | Payer: Medicare Other | Source: Ambulatory Visit | Attending: Nephrology | Admitting: Nephrology

## 2018-08-18 VITALS — BP 107/50 | HR 74 | Temp 98.8°F | Resp 20

## 2018-08-18 DIAGNOSIS — D638 Anemia in other chronic diseases classified elsewhere: Secondary | ICD-10-CM | POA: Insufficient documentation

## 2018-08-18 LAB — POCT HEMOGLOBIN-HEMACUE: Hemoglobin: 11.7 g/dL — ABNORMAL LOW (ref 13.0–17.0)

## 2018-08-18 LAB — CBC WITH DIFFERENTIAL/PLATELET
Abs Immature Granulocytes: 0.01 10*3/uL (ref 0.00–0.07)
BASOS ABS: 0 10*3/uL (ref 0.0–0.1)
Basophils Relative: 1 %
Eosinophils Absolute: 0.1 10*3/uL (ref 0.0–0.5)
Eosinophils Relative: 2 %
HEMATOCRIT: 36.3 % — AB (ref 39.0–52.0)
Hemoglobin: 11.5 g/dL — ABNORMAL LOW (ref 13.0–17.0)
IMMATURE GRANULOCYTES: 0 %
Lymphocytes Relative: 7 %
Lymphs Abs: 0.4 10*3/uL — ABNORMAL LOW (ref 0.7–4.0)
MCH: 33.1 pg (ref 26.0–34.0)
MCHC: 31.7 g/dL (ref 30.0–36.0)
MCV: 104.6 fL — ABNORMAL HIGH (ref 80.0–100.0)
Monocytes Absolute: 0.4 10*3/uL (ref 0.1–1.0)
Monocytes Relative: 8 %
Neutro Abs: 4.2 10*3/uL (ref 1.7–7.7)
Neutrophils Relative %: 82 %
Platelets: 171 10*3/uL (ref 150–400)
RBC: 3.47 MIL/uL — ABNORMAL LOW (ref 4.22–5.81)
RDW: 13.4 % (ref 11.5–15.5)
WBC: 5.1 10*3/uL (ref 4.0–10.5)
nRBC: 0 % (ref 0.0–0.2)

## 2018-08-18 LAB — IRON AND TIBC
Iron: 75 ug/dL (ref 45–182)
Saturation Ratios: 28 % (ref 17.9–39.5)
TIBC: 267 ug/dL (ref 250–450)
UIBC: 192 ug/dL

## 2018-08-18 LAB — FERRITIN: Ferritin: 81 ng/mL (ref 24–336)

## 2018-08-18 MED ORDER — EPOETIN ALFA-EPBX 10000 UNIT/ML IJ SOLN
10000.0000 [IU] | INTRAMUSCULAR | Status: DC
  Administered 2018-08-18: 10000 [IU] via SUBCUTANEOUS
  Filled 2018-08-18: qty 1

## 2018-08-28 DIAGNOSIS — I129 Hypertensive chronic kidney disease with stage 1 through stage 4 chronic kidney disease, or unspecified chronic kidney disease: Secondary | ICD-10-CM | POA: Diagnosis not present

## 2018-08-28 DIAGNOSIS — N2581 Secondary hyperparathyroidism of renal origin: Secondary | ICD-10-CM | POA: Diagnosis not present

## 2018-08-28 DIAGNOSIS — D631 Anemia in chronic kidney disease: Secondary | ICD-10-CM | POA: Diagnosis not present

## 2018-08-28 DIAGNOSIS — N184 Chronic kidney disease, stage 4 (severe): Secondary | ICD-10-CM | POA: Diagnosis not present

## 2018-09-01 ENCOUNTER — Ambulatory Visit (HOSPITAL_COMMUNITY)
Admission: RE | Admit: 2018-09-01 | Discharge: 2018-09-01 | Disposition: A | Discharge status: Home / Self Care | Payer: Medicare Other | Source: Ambulatory Visit | Attending: Nephrology | Admitting: Nephrology

## 2018-09-01 VITALS — BP 141/58 | HR 79 | Temp 97.7°F | Resp 20

## 2018-09-01 DIAGNOSIS — D638 Anemia in other chronic diseases classified elsewhere: Secondary | ICD-10-CM | POA: Diagnosis not present

## 2018-09-01 LAB — POCT HEMOGLOBIN-HEMACUE: Hemoglobin: 11.9 g/dL — ABNORMAL LOW (ref 13.0–17.0)

## 2018-09-01 MED ORDER — EPOETIN ALFA-EPBX 10000 UNIT/ML IJ SOLN
10000.0000 [IU] | INTRAMUSCULAR | Status: DC
  Administered 2018-09-01: 10000 [IU] via SUBCUTANEOUS
  Filled 2018-09-01: qty 1

## 2018-09-15 ENCOUNTER — Encounter (HOSPITAL_COMMUNITY)
Admission: RE | Admit: 2018-09-15 | Discharge: 2018-09-15 | Disposition: A | Discharge status: Home / Self Care | Payer: Medicare Other | Source: Ambulatory Visit | Attending: Nephrology | Admitting: Nephrology

## 2018-09-15 ENCOUNTER — Other Ambulatory Visit: Payer: Self-pay

## 2018-09-15 VITALS — BP 146/76 | HR 70 | Temp 98.9°F | Resp 20

## 2018-09-15 DIAGNOSIS — D638 Anemia in other chronic diseases classified elsewhere: Secondary | ICD-10-CM | POA: Insufficient documentation

## 2018-09-15 LAB — CBC WITH DIFFERENTIAL/PLATELET
Abs Immature Granulocytes: 0.01 10*3/uL (ref 0.00–0.07)
Basophils Absolute: 0 10*3/uL (ref 0.0–0.1)
Basophils Relative: 0 %
Eosinophils Absolute: 0.1 10*3/uL (ref 0.0–0.5)
Eosinophils Relative: 2 %
HCT: 36.6 % — ABNORMAL LOW (ref 39.0–52.0)
Hemoglobin: 11.4 g/dL — ABNORMAL LOW (ref 13.0–17.0)
IMMATURE GRANULOCYTES: 0 %
Lymphocytes Relative: 7 %
Lymphs Abs: 0.4 10*3/uL — ABNORMAL LOW (ref 0.7–4.0)
MCH: 32.2 pg (ref 26.0–34.0)
MCHC: 31.1 g/dL (ref 30.0–36.0)
MCV: 103.4 fL — ABNORMAL HIGH (ref 80.0–100.0)
Monocytes Absolute: 0.4 10*3/uL (ref 0.1–1.0)
Monocytes Relative: 7 %
NEUTROS PCT: 84 %
Neutro Abs: 4.5 10*3/uL (ref 1.7–7.7)
Platelets: 171 10*3/uL (ref 150–400)
RBC: 3.54 MIL/uL — ABNORMAL LOW (ref 4.22–5.81)
RDW: 13.7 % (ref 11.5–15.5)
WBC: 5.3 10*3/uL (ref 4.0–10.5)
nRBC: 0 % (ref 0.0–0.2)

## 2018-09-15 LAB — POCT HEMOGLOBIN-HEMACUE: HEMOGLOBIN: 11.5 g/dL — AB (ref 13.0–17.0)

## 2018-09-15 LAB — FERRITIN: Ferritin: 102 ng/mL (ref 24–336)

## 2018-09-15 LAB — IRON AND TIBC
Iron: 55 ug/dL (ref 45–182)
Saturation Ratios: 22 % (ref 17.9–39.5)
TIBC: 255 ug/dL (ref 250–450)
UIBC: 200 ug/dL

## 2018-09-15 MED ORDER — EPOETIN ALFA-EPBX 10000 UNIT/ML IJ SOLN
10000.0000 [IU] | Freq: Once | INTRAMUSCULAR | Status: DC
  Filled 2018-09-15: qty 1

## 2018-09-15 MED ORDER — EPOETIN ALFA-EPBX 10000 UNIT/ML IJ SOLN
10000.0000 [IU] | INTRAMUSCULAR | Status: DC
  Administered 2018-09-15: 10000 [IU] via SUBCUTANEOUS
  Filled 2018-09-15: qty 1

## 2018-09-16 LAB — PTH, INTACT AND CALCIUM
Calcium, Total (PTH): 8.7 mg/dL (ref 8.6–10.2)
PTH: 93 pg/mL — ABNORMAL HIGH (ref 15–65)

## 2018-09-16 LAB — VITAMIN D 25 HYDROXY (VIT D DEFICIENCY, FRACTURES): Vit D, 25-Hydroxy: 48.2 ng/mL (ref 30.0–100.0)

## 2018-09-29 ENCOUNTER — Other Ambulatory Visit: Payer: Self-pay

## 2018-09-29 ENCOUNTER — Encounter (HOSPITAL_COMMUNITY)
Admission: RE | Admit: 2018-09-29 | Discharge: 2018-09-29 | Disposition: A | Discharge status: Home / Self Care | Payer: Medicare Other | Source: Ambulatory Visit | Attending: Nephrology | Admitting: Nephrology

## 2018-09-29 VITALS — BP 139/64 | HR 71 | Temp 97.5°F | Resp 20

## 2018-09-29 DIAGNOSIS — D638 Anemia in other chronic diseases classified elsewhere: Secondary | ICD-10-CM

## 2018-09-29 LAB — RENAL FUNCTION PANEL
Albumin: 3.5 g/dL (ref 3.5–5.0)
Anion gap: 12 (ref 5–15)
BUN: 47 mg/dL — AB (ref 8–23)
CO2: 24 mmol/L (ref 22–32)
Calcium: 9.5 mg/dL (ref 8.9–10.3)
Chloride: 108 mmol/L (ref 98–111)
Creatinine, Ser: 2.35 mg/dL — ABNORMAL HIGH (ref 0.61–1.24)
GFR calc Af Amer: 28 mL/min — ABNORMAL LOW (ref >60)
GFR calc non Af Amer: 24 mL/min — ABNORMAL LOW (ref >60)
GLUCOSE: 167 mg/dL — AB (ref 70–99)
Phosphorus: 3.9 mg/dL (ref 2.5–4.6)
Potassium: 4.3 mmol/L (ref 3.5–5.1)
Sodium: 144 mmol/L (ref 135–145)

## 2018-09-29 LAB — MAGNESIUM: Magnesium: 2.4 mg/dL (ref 1.7–2.4)

## 2018-09-29 MED ORDER — EPOETIN ALFA-EPBX 10000 UNIT/ML IJ SOLN
10000.0000 [IU] | INTRAMUSCULAR | Status: DC
  Administered 2018-09-29: 10000 [IU] via SUBCUTANEOUS
  Filled 2018-09-29: qty 1

## 2018-10-02 LAB — POCT HEMOGLOBIN-HEMACUE: Hemoglobin: 11.9 g/dL — ABNORMAL LOW (ref 13.0–17.0)

## 2018-10-11 ENCOUNTER — Ambulatory Visit: Payer: Medicare Other | Admitting: Podiatry

## 2018-10-13 ENCOUNTER — Encounter (HOSPITAL_COMMUNITY)
Admission: RE | Admit: 2018-10-13 | Discharge: 2018-10-13 | Disposition: A | Discharge status: Home / Self Care | Payer: Medicare Other | Source: Ambulatory Visit | Attending: Nephrology | Admitting: Nephrology

## 2018-10-13 ENCOUNTER — Other Ambulatory Visit: Payer: Self-pay

## 2018-10-13 VITALS — BP 147/64 | HR 81 | Temp 97.7°F

## 2018-10-13 DIAGNOSIS — D638 Anemia in other chronic diseases classified elsewhere: Secondary | ICD-10-CM | POA: Insufficient documentation

## 2018-10-13 LAB — IRON AND TIBC
Iron: 74 ug/dL (ref 45–182)
Saturation Ratios: 26 % (ref 17.9–39.5)
TIBC: 284 ug/dL (ref 250–450)
UIBC: 210 ug/dL

## 2018-10-13 LAB — FERRITIN: Ferritin: 123 ng/mL (ref 24–336)

## 2018-10-13 LAB — POCT HEMOGLOBIN-HEMACUE: Hemoglobin: 11.6 g/dL — ABNORMAL LOW (ref 13.0–17.0)

## 2018-10-13 MED ORDER — EPOETIN ALFA-EPBX 10000 UNIT/ML IJ SOLN
10000.0000 [IU] | INTRAMUSCULAR | Status: DC
  Administered 2018-10-13: 10000 [IU] via SUBCUTANEOUS
  Filled 2018-10-13: qty 1

## 2018-10-26 ENCOUNTER — Other Ambulatory Visit: Payer: Self-pay

## 2018-10-27 ENCOUNTER — Ambulatory Visit (HOSPITAL_COMMUNITY)
Admission: RE | Admit: 2018-10-27 | Discharge: 2018-10-27 | Disposition: A | Discharge status: Home / Self Care | Payer: Medicare Other | Source: Ambulatory Visit | Attending: Nephrology | Admitting: Nephrology

## 2018-10-27 VITALS — BP 127/54 | HR 82 | Temp 97.3°F | Resp 20

## 2018-10-27 DIAGNOSIS — D638 Anemia in other chronic diseases classified elsewhere: Secondary | ICD-10-CM | POA: Diagnosis not present

## 2018-10-27 LAB — RENAL FUNCTION PANEL
Albumin: 3.5 g/dL (ref 3.5–5.0)
Anion gap: 10 (ref 5–15)
BUN: 45 mg/dL — ABNORMAL HIGH (ref 8–23)
CO2: 22 mmol/L (ref 22–32)
Calcium: 9 mg/dL (ref 8.9–10.3)
Chloride: 110 mmol/L (ref 98–111)
Creatinine, Ser: 2.39 mg/dL — ABNORMAL HIGH (ref 0.61–1.24)
GFR calc Af Amer: 27 mL/min — ABNORMAL LOW (ref >60)
GFR calc non Af Amer: 23 mL/min — ABNORMAL LOW (ref >60)
Glucose, Bld: 140 mg/dL — ABNORMAL HIGH (ref 70–99)
Phosphorus: 3.2 mg/dL (ref 2.5–4.6)
Potassium: 4.3 mmol/L (ref 3.5–5.1)
Sodium: 142 mmol/L (ref 135–145)

## 2018-10-27 LAB — POCT HEMOGLOBIN-HEMACUE: Hemoglobin: 11.7 g/dL — ABNORMAL LOW (ref 13.0–17.0)

## 2018-10-27 LAB — MAGNESIUM: Magnesium: 2.4 mg/dL (ref 1.7–2.4)

## 2018-10-27 MED ORDER — EPOETIN ALFA-EPBX 10000 UNIT/ML IJ SOLN
10000.0000 [IU] | INTRAMUSCULAR | Status: DC
  Administered 2018-10-27: 10000 [IU] via SUBCUTANEOUS
  Filled 2018-10-27: qty 1

## 2018-11-10 ENCOUNTER — Other Ambulatory Visit: Payer: Self-pay

## 2018-11-10 ENCOUNTER — Ambulatory Visit (HOSPITAL_COMMUNITY)
Admission: RE | Admit: 2018-11-10 | Discharge: 2018-11-10 | Disposition: A | Discharge status: Home / Self Care | Payer: Medicare Other | Source: Ambulatory Visit | Attending: Nephrology | Admitting: Nephrology

## 2018-11-10 VITALS — BP 127/60 | HR 81 | Temp 97.6°F | Resp 20

## 2018-11-10 DIAGNOSIS — D638 Anemia in other chronic diseases classified elsewhere: Secondary | ICD-10-CM | POA: Insufficient documentation

## 2018-11-10 MED ORDER — EPOETIN ALFA-EPBX 10000 UNIT/ML IJ SOLN
10000.0000 [IU] | INTRAMUSCULAR | Status: DC
  Administered 2018-11-10: 10000 [IU] via SUBCUTANEOUS
  Filled 2018-11-10: qty 1

## 2018-11-13 LAB — POCT HEMOGLOBIN-HEMACUE: Hemoglobin: 11.6 g/dL — ABNORMAL LOW (ref 13.0–17.0)

## 2018-11-24 ENCOUNTER — Ambulatory Visit (HOSPITAL_COMMUNITY)
Admission: RE | Admit: 2018-11-24 | Discharge: 2018-11-24 | Disposition: A | Discharge status: Home / Self Care | Payer: Medicare Other | Source: Ambulatory Visit | Attending: Nephrology | Admitting: Nephrology

## 2018-11-24 ENCOUNTER — Other Ambulatory Visit: Payer: Self-pay

## 2018-11-24 VITALS — BP 136/66 | HR 71 | Temp 97.6°F | Resp 18

## 2018-11-24 DIAGNOSIS — D638 Anemia in other chronic diseases classified elsewhere: Secondary | ICD-10-CM | POA: Diagnosis not present

## 2018-11-24 LAB — IRON AND TIBC
Iron: 84 ug/dL (ref 45–182)
Saturation Ratios: 32 % (ref 17.9–39.5)
TIBC: 266 ug/dL (ref 250–450)
UIBC: 182 ug/dL

## 2018-11-24 LAB — RENAL FUNCTION PANEL
Albumin: 3.4 g/dL — ABNORMAL LOW (ref 3.5–5.0)
Anion gap: 10 (ref 5–15)
BUN: 49 mg/dL — ABNORMAL HIGH (ref 8–23)
CO2: 23 mmol/L (ref 22–32)
Calcium: 8.8 mg/dL — ABNORMAL LOW (ref 8.9–10.3)
Chloride: 110 mmol/L (ref 98–111)
Creatinine, Ser: 2.06 mg/dL — ABNORMAL HIGH (ref 0.61–1.24)
GFR calc Af Amer: 32 mL/min — ABNORMAL LOW (ref >60)
GFR calc non Af Amer: 28 mL/min — ABNORMAL LOW (ref >60)
Glucose, Bld: 124 mg/dL — ABNORMAL HIGH (ref 70–99)
Phosphorus: 3.8 mg/dL (ref 2.5–4.6)
Potassium: 4.5 mmol/L (ref 3.5–5.1)
Sodium: 143 mmol/L (ref 135–145)

## 2018-11-24 LAB — POCT HEMOGLOBIN-HEMACUE: Hemoglobin: 11.4 g/dL — ABNORMAL LOW (ref 13.0–17.0)

## 2018-11-24 LAB — FERRITIN: Ferritin: 108 ng/mL (ref 24–336)

## 2018-11-24 LAB — MAGNESIUM: Magnesium: 2.4 mg/dL (ref 1.7–2.4)

## 2018-11-24 MED ORDER — EPOETIN ALFA-EPBX 10000 UNIT/ML IJ SOLN
10000.0000 [IU] | INTRAMUSCULAR | Status: DC
  Administered 2018-11-24: 10000 [IU] via SUBCUTANEOUS
  Filled 2018-11-24: qty 1

## 2018-12-08 ENCOUNTER — Other Ambulatory Visit: Payer: Self-pay

## 2018-12-08 ENCOUNTER — Ambulatory Visit (HOSPITAL_COMMUNITY)
Admission: RE | Admit: 2018-12-08 | Discharge: 2018-12-08 | Disposition: A | Discharge status: Home / Self Care | Payer: Medicare Other | Source: Ambulatory Visit | Attending: Nephrology | Admitting: Nephrology

## 2018-12-08 VITALS — BP 129/49 | HR 69 | Temp 97.1°F | Resp 20

## 2018-12-08 DIAGNOSIS — D638 Anemia in other chronic diseases classified elsewhere: Secondary | ICD-10-CM | POA: Insufficient documentation

## 2018-12-08 LAB — POCT HEMOGLOBIN-HEMACUE: Hemoglobin: 11.6 g/dL — ABNORMAL LOW (ref 13.0–17.0)

## 2018-12-08 MED ORDER — EPOETIN ALFA-EPBX 10000 UNIT/ML IJ SOLN
10000.0000 [IU] | INTRAMUSCULAR | Status: DC
  Administered 2018-12-08: 10000 [IU] via SUBCUTANEOUS
  Filled 2018-12-08: qty 1

## 2018-12-20 DIAGNOSIS — E113553 Type 2 diabetes mellitus with stable proliferative diabetic retinopathy, bilateral: Secondary | ICD-10-CM | POA: Diagnosis not present

## 2018-12-20 DIAGNOSIS — E7800 Pure hypercholesterolemia, unspecified: Secondary | ICD-10-CM | POA: Diagnosis not present

## 2018-12-20 DIAGNOSIS — D510 Vitamin B12 deficiency anemia due to intrinsic factor deficiency: Secondary | ICD-10-CM | POA: Diagnosis not present

## 2018-12-20 DIAGNOSIS — I129 Hypertensive chronic kidney disease with stage 1 through stage 4 chronic kidney disease, or unspecified chronic kidney disease: Secondary | ICD-10-CM | POA: Diagnosis not present

## 2018-12-20 DIAGNOSIS — R82998 Other abnormal findings in urine: Secondary | ICD-10-CM | POA: Diagnosis not present

## 2018-12-20 DIAGNOSIS — Z125 Encounter for screening for malignant neoplasm of prostate: Secondary | ICD-10-CM | POA: Diagnosis not present

## 2018-12-20 DIAGNOSIS — E559 Vitamin D deficiency, unspecified: Secondary | ICD-10-CM | POA: Diagnosis not present

## 2018-12-22 ENCOUNTER — Ambulatory Visit (HOSPITAL_COMMUNITY)
Admission: RE | Admit: 2018-12-22 | Discharge: 2018-12-22 | Disposition: A | Discharge status: Home / Self Care | Payer: Medicare Other | Source: Ambulatory Visit | Attending: Nephrology | Admitting: Nephrology

## 2018-12-22 ENCOUNTER — Other Ambulatory Visit: Payer: Self-pay

## 2018-12-22 VITALS — BP 123/54 | HR 72 | Temp 96.9°F | Resp 20

## 2018-12-22 DIAGNOSIS — D638 Anemia in other chronic diseases classified elsewhere: Secondary | ICD-10-CM | POA: Diagnosis not present

## 2018-12-22 LAB — IRON AND TIBC
Iron: 55 ug/dL (ref 45–182)
Saturation Ratios: 23 % (ref 17.9–39.5)
TIBC: 241 ug/dL — ABNORMAL LOW (ref 250–450)
UIBC: 186 ug/dL

## 2018-12-22 LAB — RENAL FUNCTION PANEL
Albumin: 3.4 g/dL — ABNORMAL LOW (ref 3.5–5.0)
Anion gap: 10 (ref 5–15)
BUN: 51 mg/dL — ABNORMAL HIGH (ref 8–23)
CO2: 25 mmol/L (ref 22–32)
Calcium: 9 mg/dL (ref 8.9–10.3)
Chloride: 109 mmol/L (ref 98–111)
Creatinine, Ser: 2.63 mg/dL — ABNORMAL HIGH (ref 0.61–1.24)
GFR calc Af Amer: 24 mL/min — ABNORMAL LOW (ref >60)
GFR calc non Af Amer: 21 mL/min — ABNORMAL LOW (ref >60)
Glucose, Bld: 132 mg/dL — ABNORMAL HIGH (ref 70–99)
Phosphorus: 3.8 mg/dL (ref 2.5–4.6)
Potassium: 4.2 mmol/L (ref 3.5–5.1)
Sodium: 144 mmol/L (ref 135–145)

## 2018-12-22 LAB — FERRITIN: Ferritin: 125 ng/mL (ref 24–336)

## 2018-12-22 LAB — POCT HEMOGLOBIN-HEMACUE: Hemoglobin: 11.3 g/dL — ABNORMAL LOW (ref 13.0–17.0)

## 2018-12-22 LAB — MAGNESIUM: Magnesium: 2.3 mg/dL (ref 1.7–2.4)

## 2018-12-22 MED ORDER — EPOETIN ALFA-EPBX 10000 UNIT/ML IJ SOLN
10000.0000 [IU] | INTRAMUSCULAR | Status: DC
  Administered 2018-12-22: 10000 [IU] via SUBCUTANEOUS
  Filled 2018-12-22: qty 1

## 2018-12-25 DIAGNOSIS — I129 Hypertensive chronic kidney disease with stage 1 through stage 4 chronic kidney disease, or unspecified chronic kidney disease: Secondary | ICD-10-CM | POA: Diagnosis not present

## 2018-12-25 DIAGNOSIS — N2581 Secondary hyperparathyroidism of renal origin: Secondary | ICD-10-CM | POA: Diagnosis not present

## 2018-12-25 DIAGNOSIS — D631 Anemia in chronic kidney disease: Secondary | ICD-10-CM | POA: Diagnosis not present

## 2018-12-25 DIAGNOSIS — N184 Chronic kidney disease, stage 4 (severe): Secondary | ICD-10-CM | POA: Diagnosis not present

## 2018-12-26 DIAGNOSIS — E113553 Type 2 diabetes mellitus with stable proliferative diabetic retinopathy, bilateral: Secondary | ICD-10-CM | POA: Diagnosis not present

## 2018-12-26 DIAGNOSIS — D692 Other nonthrombocytopenic purpura: Secondary | ICD-10-CM | POA: Diagnosis not present

## 2018-12-26 DIAGNOSIS — E7800 Pure hypercholesterolemia, unspecified: Secondary | ICD-10-CM | POA: Diagnosis not present

## 2018-12-26 DIAGNOSIS — Z0000 Encounter for general adult medical examination without abnormal findings: Secondary | ICD-10-CM | POA: Diagnosis not present

## 2018-12-26 DIAGNOSIS — C4492 Squamous cell carcinoma of skin, unspecified: Secondary | ICD-10-CM | POA: Diagnosis not present

## 2018-12-26 DIAGNOSIS — E559 Vitamin D deficiency, unspecified: Secondary | ICD-10-CM | POA: Diagnosis not present

## 2018-12-26 DIAGNOSIS — N390 Urinary tract infection, site not specified: Secondary | ICD-10-CM | POA: Diagnosis not present

## 2018-12-26 DIAGNOSIS — I129 Hypertensive chronic kidney disease with stage 1 through stage 4 chronic kidney disease, or unspecified chronic kidney disease: Secondary | ICD-10-CM | POA: Diagnosis not present

## 2018-12-26 DIAGNOSIS — Z1331 Encounter for screening for depression: Secondary | ICD-10-CM | POA: Diagnosis not present

## 2018-12-26 DIAGNOSIS — Z1339 Encounter for screening examination for other mental health and behavioral disorders: Secondary | ICD-10-CM | POA: Diagnosis not present

## 2018-12-26 DIAGNOSIS — E1129 Type 2 diabetes mellitus with other diabetic kidney complication: Secondary | ICD-10-CM | POA: Diagnosis not present

## 2018-12-26 DIAGNOSIS — D631 Anemia in chronic kidney disease: Secondary | ICD-10-CM | POA: Diagnosis not present

## 2018-12-26 DIAGNOSIS — N184 Chronic kidney disease, stage 4 (severe): Secondary | ICD-10-CM | POA: Diagnosis not present

## 2019-01-04 ENCOUNTER — Other Ambulatory Visit: Payer: Self-pay

## 2019-01-04 ENCOUNTER — Encounter (HOSPITAL_COMMUNITY)
Admission: RE | Admit: 2019-01-04 | Discharge: 2019-01-04 | Disposition: A | Discharge status: Home / Self Care | Payer: Medicare Other | Source: Ambulatory Visit | Attending: Nephrology | Admitting: Nephrology

## 2019-01-04 VITALS — BP 121/53 | HR 57 | Temp 96.8°F | Resp 20

## 2019-01-04 DIAGNOSIS — D638 Anemia in other chronic diseases classified elsewhere: Secondary | ICD-10-CM | POA: Diagnosis not present

## 2019-01-04 MED ORDER — EPOETIN ALFA-EPBX 10000 UNIT/ML IJ SOLN
10000.0000 [IU] | INTRAMUSCULAR | Status: DC
  Administered 2019-01-04: 10000 [IU] via SUBCUTANEOUS
  Filled 2019-01-04: qty 1

## 2019-01-08 LAB — POCT HEMOGLOBIN-HEMACUE: Hemoglobin: 11.5 g/dL — ABNORMAL LOW (ref 13.0–17.0)

## 2019-01-10 ENCOUNTER — Encounter: Payer: Self-pay | Admitting: Podiatry

## 2019-01-10 ENCOUNTER — Other Ambulatory Visit: Payer: Self-pay

## 2019-01-10 ENCOUNTER — Ambulatory Visit (INDEPENDENT_AMBULATORY_CARE_PROVIDER_SITE_OTHER): Payer: Medicare Other | Admitting: Podiatry

## 2019-01-10 DIAGNOSIS — L97509 Non-pressure chronic ulcer of other part of unspecified foot with unspecified severity: Secondary | ICD-10-CM | POA: Insufficient documentation

## 2019-01-10 DIAGNOSIS — B351 Tinea unguium: Secondary | ICD-10-CM

## 2019-01-10 DIAGNOSIS — M79675 Pain in left toe(s): Secondary | ICD-10-CM

## 2019-01-10 DIAGNOSIS — E11621 Type 2 diabetes mellitus with foot ulcer: Secondary | ICD-10-CM

## 2019-01-10 DIAGNOSIS — L97511 Non-pressure chronic ulcer of other part of right foot limited to breakdown of skin: Secondary | ICD-10-CM

## 2019-01-10 DIAGNOSIS — M79674 Pain in right toe(s): Secondary | ICD-10-CM

## 2019-01-10 DIAGNOSIS — L089 Local infection of the skin and subcutaneous tissue, unspecified: Secondary | ICD-10-CM | POA: Insufficient documentation

## 2019-01-10 MED ORDER — CEPHALEXIN 500 MG PO CAPS: 500.0000 mg | ORAL_CAPSULE | Freq: Two times a day (BID) | ORAL | 0 refills | Status: DC

## 2019-01-10 NOTE — Progress Notes (Signed)
This patient presents to the office for preventative foot care services.  He says the nails are painful walking and wearing shoes.  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  He has been treated with diabetic shoes previously but the ulcer persists.  He says there is still callus formation but minimal drainage.  He also says that he was working mowing his yard and developed a blister second toe left foot.  He says he has been applying medicine .  He presents to the office for continued evaluation and treatment.  General Appearance  Alert, conversant and in no acute stress.  Vascular  Dorsalis pedis and posterior tibial  pulses are palpable  bilaterally.  Capillary return is within normal limits  bilaterally. Temperature is within normal limits  bilaterally.  Neurologic  Senn-Weinstein monofilament wire test diminished  bilaterally. Muscle power within normal limits bilaterally.  Nails Thick disfigured discolored nails with subungual debris  from hallux to fifth toes bilaterally. No evidence of bacterial infection or drainage bilaterally.  Orthopedic  No limitations of motion  feet .  No crepitus or effusions noted.  Plantar flexed first metatarsal right foot.    Skin  normotropic skin with no porokeratosis noted bilaterally.  Chronic ulcer sub 1 right foot.  Necrotic tissue noted encircling ulcer sub 1 right foot.  No signs of redness or swelling or drainage noted.  Ulcer measures 5 x 5 mm.  Red inflamed blister second toe left foot.  Onychomycosis   Ulcer sub 1 right foot.  ROV.  Debridement of nails  X 10.  Debride necrotic tissue surrounding chronic ulcer right foot.  Neosporin/DSD.  Told to perform soaks at home .   Prescribed cephalexin orally to help with his healing.   RTC 10 weeks.   Gardiner Barefoot DPM

## 2019-01-18 ENCOUNTER — Ambulatory Visit (HOSPITAL_COMMUNITY)
Admission: RE | Admit: 2019-01-18 | Discharge: 2019-01-18 | Disposition: A | Discharge status: Home / Self Care | Payer: Medicare Other | Source: Ambulatory Visit | Attending: Nephrology | Admitting: Nephrology

## 2019-01-18 ENCOUNTER — Other Ambulatory Visit: Payer: Self-pay

## 2019-01-18 VITALS — BP 132/53 | HR 67 | Temp 96.8°F | Resp 20

## 2019-01-18 DIAGNOSIS — D638 Anemia in other chronic diseases classified elsewhere: Secondary | ICD-10-CM | POA: Diagnosis not present

## 2019-01-18 LAB — RENAL FUNCTION PANEL
Albumin: 3.5 g/dL (ref 3.5–5.0)
Anion gap: 10 (ref 5–15)
BUN: 45 mg/dL — ABNORMAL HIGH (ref 8–23)
CO2: 24 mmol/L (ref 22–32)
Calcium: 9.2 mg/dL (ref 8.9–10.3)
Chloride: 108 mmol/L (ref 98–111)
Creatinine, Ser: 2.15 mg/dL — ABNORMAL HIGH (ref 0.61–1.24)
GFR calc Af Amer: 31 mL/min — ABNORMAL LOW (ref >60)
GFR calc non Af Amer: 27 mL/min — ABNORMAL LOW (ref >60)
Glucose, Bld: 132 mg/dL — ABNORMAL HIGH (ref 70–99)
Phosphorus: 3.8 mg/dL (ref 2.5–4.6)
Potassium: 4.4 mmol/L (ref 3.5–5.1)
Sodium: 142 mmol/L (ref 135–145)

## 2019-01-18 LAB — IRON AND TIBC
Iron: 75 ug/dL (ref 45–182)
Saturation Ratios: 30 % (ref 17.9–39.5)
TIBC: 249 ug/dL — ABNORMAL LOW (ref 250–450)
UIBC: 174 ug/dL

## 2019-01-18 LAB — POCT HEMOGLOBIN-HEMACUE: Hemoglobin: 12.2 g/dL — ABNORMAL LOW (ref 13.0–17.0)

## 2019-01-18 LAB — MAGNESIUM: Magnesium: 2.6 mg/dL — ABNORMAL HIGH (ref 1.7–2.4)

## 2019-01-18 LAB — FERRITIN: Ferritin: 130 ng/mL (ref 24–336)

## 2019-01-18 MED ORDER — EPOETIN ALFA-EPBX 10000 UNIT/ML IJ SOLN
10000.0000 [IU] | INTRAMUSCULAR | Status: DC
  Filled 2019-01-18: qty 1

## 2019-02-01 ENCOUNTER — Ambulatory Visit (HOSPITAL_COMMUNITY)
Admission: RE | Admit: 2019-02-01 | Discharge: 2019-02-01 | Disposition: A | Discharge status: Home / Self Care | Payer: Medicare Other | Source: Ambulatory Visit | Attending: Nephrology | Admitting: Nephrology

## 2019-02-01 ENCOUNTER — Other Ambulatory Visit: Payer: Self-pay

## 2019-02-01 VITALS — BP 125/48 | HR 66 | Temp 96.8°F | Resp 20

## 2019-02-01 DIAGNOSIS — D638 Anemia in other chronic diseases classified elsewhere: Secondary | ICD-10-CM | POA: Insufficient documentation

## 2019-02-01 LAB — POCT HEMOGLOBIN-HEMACUE: Hemoglobin: 11.6 g/dL — ABNORMAL LOW (ref 13.0–17.0)

## 2019-02-01 MED ORDER — EPOETIN ALFA-EPBX 10000 UNIT/ML IJ SOLN
10000.0000 [IU] | INTRAMUSCULAR | Status: DC
  Administered 2019-02-01: 09:00:00 10000 [IU] via SUBCUTANEOUS
  Filled 2019-02-01: qty 1

## 2019-02-15 ENCOUNTER — Ambulatory Visit (HOSPITAL_COMMUNITY)
Admission: RE | Admit: 2019-02-15 | Discharge: 2019-02-15 | Disposition: A | Discharge status: Home / Self Care | Payer: Medicare Other | Source: Ambulatory Visit | Attending: Nephrology | Admitting: Nephrology

## 2019-02-15 ENCOUNTER — Other Ambulatory Visit: Payer: Self-pay

## 2019-02-15 VITALS — BP 114/51 | HR 76 | Temp 97.0°F | Resp 20

## 2019-02-15 DIAGNOSIS — D638 Anemia in other chronic diseases classified elsewhere: Secondary | ICD-10-CM | POA: Diagnosis not present

## 2019-02-15 LAB — RENAL FUNCTION PANEL
Albumin: 3.4 g/dL — ABNORMAL LOW (ref 3.5–5.0)
Anion gap: 11 (ref 5–15)
BUN: 48 mg/dL — ABNORMAL HIGH (ref 8–23)
CO2: 24 mmol/L (ref 22–32)
Calcium: 9 mg/dL (ref 8.9–10.3)
Chloride: 108 mmol/L (ref 98–111)
Creatinine, Ser: 2.54 mg/dL — ABNORMAL HIGH (ref 0.61–1.24)
GFR calc Af Amer: 25 mL/min — ABNORMAL LOW (ref >60)
GFR calc non Af Amer: 22 mL/min — ABNORMAL LOW (ref >60)
Glucose, Bld: 133 mg/dL — ABNORMAL HIGH (ref 70–99)
Phosphorus: 3.5 mg/dL (ref 2.5–4.6)
Potassium: 4.1 mmol/L (ref 3.5–5.1)
Sodium: 143 mmol/L (ref 135–145)

## 2019-02-15 LAB — IRON AND TIBC
Iron: 74 ug/dL (ref 45–182)
Saturation Ratios: 30 % (ref 17.9–39.5)
TIBC: 251 ug/dL (ref 250–450)
UIBC: 177 ug/dL

## 2019-02-15 LAB — FERRITIN: Ferritin: 147 ng/mL (ref 24–336)

## 2019-02-15 LAB — POCT HEMOGLOBIN-HEMACUE: Hemoglobin: 11.4 g/dL — ABNORMAL LOW (ref 13.0–17.0)

## 2019-02-15 LAB — MAGNESIUM: Magnesium: 2.2 mg/dL (ref 1.7–2.4)

## 2019-02-15 MED ORDER — EPOETIN ALFA-EPBX 10000 UNIT/ML IJ SOLN
10000.0000 [IU] | INTRAMUSCULAR | Status: DC
  Administered 2019-02-15: 10000 [IU] via SUBCUTANEOUS
  Filled 2019-02-15: qty 1

## 2019-03-01 ENCOUNTER — Ambulatory Visit (HOSPITAL_COMMUNITY)
Admission: RE | Admit: 2019-03-01 | Discharge: 2019-03-01 | Disposition: A | Discharge status: Home / Self Care | Payer: Medicare Other | Source: Ambulatory Visit | Attending: Nephrology | Admitting: Nephrology

## 2019-03-01 ENCOUNTER — Other Ambulatory Visit: Payer: Self-pay

## 2019-03-01 VITALS — BP 124/54 | HR 62 | Temp 96.4°F | Resp 20

## 2019-03-01 DIAGNOSIS — D638 Anemia in other chronic diseases classified elsewhere: Secondary | ICD-10-CM

## 2019-03-01 DIAGNOSIS — N189 Chronic kidney disease, unspecified: Secondary | ICD-10-CM | POA: Diagnosis not present

## 2019-03-01 DIAGNOSIS — Z79899 Other long term (current) drug therapy: Secondary | ICD-10-CM | POA: Insufficient documentation

## 2019-03-01 DIAGNOSIS — D631 Anemia in chronic kidney disease: Secondary | ICD-10-CM | POA: Insufficient documentation

## 2019-03-01 LAB — POCT I-STAT 4, (NA,K, GLUC, HGB,HCT)
Glucose, Bld: 133 mg/dL — ABNORMAL HIGH (ref 70–99)
HCT: 34 % — ABNORMAL LOW (ref 39.0–52.0)
Hemoglobin: 11.6 g/dL — ABNORMAL LOW (ref 13.0–17.0)
Potassium: 4.1 mmol/L (ref 3.5–5.1)
Sodium: 143 mmol/L (ref 135–145)

## 2019-03-01 MED ORDER — EPOETIN ALFA-EPBX 10000 UNIT/ML IJ SOLN
10000.0000 [IU] | INTRAMUSCULAR | Status: DC
  Administered 2019-03-01: 10000 [IU] via SUBCUTANEOUS
  Filled 2019-03-01: qty 1

## 2019-03-08 DIAGNOSIS — Z23 Encounter for immunization: Secondary | ICD-10-CM | POA: Diagnosis not present

## 2019-03-15 ENCOUNTER — Ambulatory Visit (HOSPITAL_COMMUNITY)
Admission: RE | Admit: 2019-03-15 | Discharge: 2019-03-15 | Disposition: A | Discharge status: Home / Self Care | Payer: Medicare Other | Source: Ambulatory Visit | Attending: Nephrology | Admitting: Nephrology

## 2019-03-15 ENCOUNTER — Other Ambulatory Visit: Payer: Self-pay

## 2019-03-15 VITALS — BP 120/49 | HR 69 | Temp 96.8°F | Resp 20

## 2019-03-15 DIAGNOSIS — D638 Anemia in other chronic diseases classified elsewhere: Secondary | ICD-10-CM | POA: Diagnosis not present

## 2019-03-15 LAB — RENAL FUNCTION PANEL
Albumin: 3.4 g/dL — ABNORMAL LOW (ref 3.5–5.0)
Anion gap: 10 (ref 5–15)
BUN: 55 mg/dL — ABNORMAL HIGH (ref 8–23)
CO2: 22 mmol/L (ref 22–32)
Calcium: 8.5 mg/dL — ABNORMAL LOW (ref 8.9–10.3)
Chloride: 109 mmol/L (ref 98–111)
Creatinine, Ser: 2.29 mg/dL — ABNORMAL HIGH (ref 0.61–1.24)
GFR calc Af Amer: 28 mL/min — ABNORMAL LOW (ref >60)
GFR calc non Af Amer: 24 mL/min — ABNORMAL LOW (ref >60)
Glucose, Bld: 121 mg/dL — ABNORMAL HIGH (ref 70–99)
Phosphorus: 3.3 mg/dL (ref 2.5–4.6)
Potassium: 4.2 mmol/L (ref 3.5–5.1)
Sodium: 141 mmol/L (ref 135–145)

## 2019-03-15 LAB — MAGNESIUM: Magnesium: 2.4 mg/dL (ref 1.7–2.4)

## 2019-03-15 MED ORDER — EPOETIN ALFA-EPBX 10000 UNIT/ML IJ SOLN
10000.0000 [IU] | INTRAMUSCULAR | Status: DC
  Administered 2019-03-15: 09:00:00 10000 [IU] via SUBCUTANEOUS
  Filled 2019-03-15: qty 1

## 2019-03-16 LAB — POCT HEMOGLOBIN-HEMACUE: Hemoglobin: 11.8 g/dL — ABNORMAL LOW (ref 13.0–17.0)

## 2019-03-29 ENCOUNTER — Ambulatory Visit (HOSPITAL_COMMUNITY)
Admission: RE | Admit: 2019-03-29 | Discharge: 2019-03-29 | Disposition: A | Discharge status: Home / Self Care | Payer: Medicare Other | Source: Ambulatory Visit | Attending: Nephrology | Admitting: Nephrology

## 2019-03-29 ENCOUNTER — Other Ambulatory Visit: Payer: Self-pay

## 2019-03-29 VITALS — BP 138/53 | HR 66 | Temp 95.8°F | Resp 20

## 2019-03-29 DIAGNOSIS — D638 Anemia in other chronic diseases classified elsewhere: Secondary | ICD-10-CM | POA: Insufficient documentation

## 2019-03-29 LAB — IRON AND TIBC
Iron: 64 ug/dL (ref 45–182)
Saturation Ratios: 25 % (ref 17.9–39.5)
TIBC: 260 ug/dL (ref 250–450)
UIBC: 196 ug/dL

## 2019-03-29 LAB — FERRITIN: Ferritin: 124 ng/mL (ref 24–336)

## 2019-03-29 LAB — POCT HEMOGLOBIN-HEMACUE: Hemoglobin: 11.5 g/dL — ABNORMAL LOW (ref 13.0–17.0)

## 2019-03-29 MED ORDER — EPOETIN ALFA-EPBX 10000 UNIT/ML IJ SOLN
10000.0000 [IU] | INTRAMUSCULAR | Status: DC
  Administered 2019-03-29: 09:00:00 10000 [IU] via SUBCUTANEOUS
  Filled 2019-03-29: qty 1

## 2019-04-05 DIAGNOSIS — L821 Other seborrheic keratosis: Secondary | ICD-10-CM | POA: Diagnosis not present

## 2019-04-05 DIAGNOSIS — L570 Actinic keratosis: Secondary | ICD-10-CM | POA: Diagnosis not present

## 2019-04-05 DIAGNOSIS — C44629 Squamous cell carcinoma of skin of left upper limb, including shoulder: Secondary | ICD-10-CM | POA: Diagnosis not present

## 2019-04-05 DIAGNOSIS — Z85828 Personal history of other malignant neoplasm of skin: Secondary | ICD-10-CM | POA: Diagnosis not present

## 2019-04-05 DIAGNOSIS — D485 Neoplasm of uncertain behavior of skin: Secondary | ICD-10-CM | POA: Diagnosis not present

## 2019-04-05 DIAGNOSIS — C44329 Squamous cell carcinoma of skin of other parts of face: Secondary | ICD-10-CM | POA: Diagnosis not present

## 2019-04-12 ENCOUNTER — Ambulatory Visit (HOSPITAL_COMMUNITY)
Admission: RE | Admit: 2019-04-12 | Discharge: 2019-04-12 | Disposition: A | Discharge status: Home / Self Care | Payer: Medicare Other | Source: Ambulatory Visit | Attending: Nephrology | Admitting: Nephrology

## 2019-04-12 VITALS — BP 119/46 | HR 67 | Temp 96.4°F | Resp 20

## 2019-04-12 DIAGNOSIS — D638 Anemia in other chronic diseases classified elsewhere: Secondary | ICD-10-CM | POA: Insufficient documentation

## 2019-04-12 LAB — RENAL FUNCTION PANEL
Albumin: 3.5 g/dL (ref 3.5–5.0)
Anion gap: 11 (ref 5–15)
BUN: 61 mg/dL — ABNORMAL HIGH (ref 8–23)
CO2: 23 mmol/L (ref 22–32)
Calcium: 9 mg/dL (ref 8.9–10.3)
Chloride: 107 mmol/L (ref 98–111)
Creatinine, Ser: 2.93 mg/dL — ABNORMAL HIGH (ref 0.61–1.24)
GFR calc Af Amer: 21 mL/min — ABNORMAL LOW (ref >60)
GFR calc non Af Amer: 18 mL/min — ABNORMAL LOW (ref >60)
Glucose, Bld: 138 mg/dL — ABNORMAL HIGH (ref 70–99)
Phosphorus: 4.6 mg/dL (ref 2.5–4.6)
Potassium: 4.1 mmol/L (ref 3.5–5.1)
Sodium: 141 mmol/L (ref 135–145)

## 2019-04-12 LAB — POCT HEMOGLOBIN-HEMACUE: Hemoglobin: 11.9 g/dL — ABNORMAL LOW (ref 13.0–17.0)

## 2019-04-12 LAB — MAGNESIUM: Magnesium: 2.3 mg/dL (ref 1.7–2.4)

## 2019-04-12 MED ORDER — EPOETIN ALFA-EPBX 10000 UNIT/ML IJ SOLN
10000.0000 [IU] | INTRAMUSCULAR | Status: DC
  Administered 2019-04-12: 10000 [IU] via SUBCUTANEOUS
  Filled 2019-04-12: qty 1

## 2019-04-26 ENCOUNTER — Other Ambulatory Visit: Payer: Self-pay

## 2019-04-26 ENCOUNTER — Ambulatory Visit (HOSPITAL_COMMUNITY)
Admission: RE | Admit: 2019-04-26 | Discharge: 2019-04-26 | Disposition: A | Discharge status: Home / Self Care | Payer: Medicare Other | Source: Ambulatory Visit | Attending: Nephrology | Admitting: Nephrology

## 2019-04-26 VITALS — BP 122/50 | HR 71 | Temp 95.7°F | Resp 20

## 2019-04-26 DIAGNOSIS — D638 Anemia in other chronic diseases classified elsewhere: Secondary | ICD-10-CM | POA: Diagnosis not present

## 2019-04-26 LAB — IRON AND TIBC
Iron: 48 ug/dL (ref 45–182)
Saturation Ratios: 19 % (ref 17.9–39.5)
TIBC: 253 ug/dL (ref 250–450)
UIBC: 205 ug/dL

## 2019-04-26 LAB — POCT HEMOGLOBIN-HEMACUE: Hemoglobin: 11.4 g/dL — ABNORMAL LOW (ref 13.0–17.0)

## 2019-04-26 LAB — FERRITIN: Ferritin: 123 ng/mL (ref 24–336)

## 2019-04-26 MED ORDER — EPOETIN ALFA-EPBX 10000 UNIT/ML IJ SOLN
10000.0000 [IU] | INTRAMUSCULAR | Status: DC
  Administered 2019-04-26: 10000 [IU] via SUBCUTANEOUS
  Filled 2019-04-26: qty 1

## 2019-04-30 DIAGNOSIS — N2581 Secondary hyperparathyroidism of renal origin: Secondary | ICD-10-CM | POA: Diagnosis not present

## 2019-04-30 DIAGNOSIS — D631 Anemia in chronic kidney disease: Secondary | ICD-10-CM | POA: Diagnosis not present

## 2019-04-30 DIAGNOSIS — N184 Chronic kidney disease, stage 4 (severe): Secondary | ICD-10-CM | POA: Diagnosis not present

## 2019-04-30 DIAGNOSIS — I129 Hypertensive chronic kidney disease with stage 1 through stage 4 chronic kidney disease, or unspecified chronic kidney disease: Secondary | ICD-10-CM | POA: Diagnosis not present

## 2019-05-08 DIAGNOSIS — C44329 Squamous cell carcinoma of skin of other parts of face: Secondary | ICD-10-CM | POA: Diagnosis not present

## 2019-05-08 DIAGNOSIS — Z85828 Personal history of other malignant neoplasm of skin: Secondary | ICD-10-CM | POA: Diagnosis not present

## 2019-05-10 ENCOUNTER — Ambulatory Visit (HOSPITAL_COMMUNITY)
Admission: RE | Admit: 2019-05-10 | Discharge: 2019-05-10 | Disposition: A | Discharge status: Home / Self Care | Payer: Medicare Other | Source: Ambulatory Visit | Attending: Nephrology | Admitting: Nephrology

## 2019-05-10 ENCOUNTER — Other Ambulatory Visit: Payer: Self-pay

## 2019-05-10 VITALS — BP 139/59 | HR 76 | Temp 95.8°F | Resp 20

## 2019-05-10 DIAGNOSIS — D638 Anemia in other chronic diseases classified elsewhere: Secondary | ICD-10-CM | POA: Insufficient documentation

## 2019-05-10 LAB — RENAL FUNCTION PANEL
Albumin: 3.4 g/dL — ABNORMAL LOW (ref 3.5–5.0)
Anion gap: 9 (ref 5–15)
BUN: 57 mg/dL — ABNORMAL HIGH (ref 8–23)
CO2: 25 mmol/L (ref 22–32)
Calcium: 9.3 mg/dL (ref 8.9–10.3)
Chloride: 107 mmol/L (ref 98–111)
Creatinine, Ser: 2.93 mg/dL — ABNORMAL HIGH (ref 0.61–1.24)
GFR calc Af Amer: 21 mL/min — ABNORMAL LOW
GFR calc non Af Amer: 18 mL/min — ABNORMAL LOW
Glucose, Bld: 152 mg/dL — ABNORMAL HIGH (ref 70–99)
Phosphorus: 4.5 mg/dL (ref 2.5–4.6)
Potassium: 4.5 mmol/L (ref 3.5–5.1)
Sodium: 141 mmol/L (ref 135–145)

## 2019-05-10 LAB — MAGNESIUM: Magnesium: 2.3 mg/dL (ref 1.7–2.4)

## 2019-05-10 LAB — POCT HEMOGLOBIN-HEMACUE: Hemoglobin: 11.6 g/dL — ABNORMAL LOW (ref 13.0–17.0)

## 2019-05-10 MED ORDER — EPOETIN ALFA-EPBX 10000 UNIT/ML IJ SOLN
10000.0000 [IU] | INTRAMUSCULAR | Status: DC
  Administered 2019-05-10: 10000 [IU] via SUBCUTANEOUS
  Filled 2019-05-10: qty 1

## 2019-05-16 ENCOUNTER — Ambulatory Visit (INDEPENDENT_AMBULATORY_CARE_PROVIDER_SITE_OTHER): Payer: Medicare Other | Admitting: Podiatry

## 2019-05-16 ENCOUNTER — Other Ambulatory Visit: Payer: Self-pay

## 2019-05-16 ENCOUNTER — Encounter: Payer: Self-pay | Admitting: Podiatry

## 2019-05-16 DIAGNOSIS — B351 Tinea unguium: Secondary | ICD-10-CM

## 2019-05-16 DIAGNOSIS — E1142 Type 2 diabetes mellitus with diabetic polyneuropathy: Secondary | ICD-10-CM | POA: Diagnosis not present

## 2019-05-16 DIAGNOSIS — E11621 Type 2 diabetes mellitus with foot ulcer: Secondary | ICD-10-CM

## 2019-05-16 DIAGNOSIS — L97511 Non-pressure chronic ulcer of other part of right foot limited to breakdown of skin: Secondary | ICD-10-CM | POA: Diagnosis not present

## 2019-05-16 DIAGNOSIS — M79676 Pain in unspecified toe(s): Secondary | ICD-10-CM

## 2019-05-16 NOTE — Progress Notes (Signed)
This patient presents to the office for preventative foot care services.  He says the nails are painful walking and wearing shoes.  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  He has been treated with diabetic shoes previously but the ulcer persists.  He says there is still callus formation but minimal drainage.   He says he has been applying medicine .  He presents to the office for continued evaluation and treatment.  General Appearance  Alert, conversant and in no acute stress.  Vascular  Dorsalis pedis and posterior tibial  pulses are palpable  bilaterally.  Capillary return is within normal limits  bilaterally. Temperature is within normal limits  bilaterally.  Neurologic  Senn-Weinstein monofilament wire test diminished  bilaterally. Muscle power within normal limits bilaterally.  Nails Thick disfigured discolored nails with subungual debris  from hallux to fifth toes bilaterally. No evidence of bacterial infection or drainage bilaterally.  Orthopedic  No limitations of motion  feet .  No crepitus or effusions noted.  Plantar flexed first metatarsal right foot.    Skin  normotropic skin with no porokeratosis noted bilaterally.  Chronic ulcer sub 1 right foot.  Necrotic tissue noted encircling ulcer sub 1 right foot.  No signs of redness or swelling or drainage noted.  Ulcer measures 1 x 5  mm.    Onychomycosis   Ulcer sub 1 right foot.  ROV.  Debridement of nails  X 10.  Debride necrotic tissue surrounding chronic ulcer right foot.  Neosporin/DSD.  Told to perform soaks at home .   Padding applied right foot.  RTC 10 weeks.   Gardiner Barefoot DPM

## 2019-05-24 ENCOUNTER — Ambulatory Visit (HOSPITAL_COMMUNITY)
Admission: RE | Admit: 2019-05-24 | Discharge: 2019-05-24 | Disposition: A | Discharge status: Home / Self Care | Payer: Medicare Other | Source: Ambulatory Visit | Attending: Nephrology | Admitting: Nephrology

## 2019-05-24 ENCOUNTER — Other Ambulatory Visit: Payer: Self-pay

## 2019-05-24 VITALS — BP 119/54 | HR 61 | Temp 96.5°F | Resp 20

## 2019-05-24 DIAGNOSIS — D638 Anemia in other chronic diseases classified elsewhere: Secondary | ICD-10-CM | POA: Insufficient documentation

## 2019-05-24 LAB — IRON AND TIBC
Iron: 85 ug/dL (ref 45–182)
Saturation Ratios: 32 % (ref 17.9–39.5)
TIBC: 262 ug/dL (ref 250–450)
UIBC: 177 ug/dL

## 2019-05-24 LAB — POCT HEMOGLOBIN-HEMACUE: Hemoglobin: 11.7 g/dL — ABNORMAL LOW (ref 13.0–17.0)

## 2019-05-24 LAB — FERRITIN: Ferritin: 133 ng/mL (ref 24–336)

## 2019-05-24 MED ORDER — EPOETIN ALFA-EPBX 10000 UNIT/ML IJ SOLN
10000.0000 [IU] | INTRAMUSCULAR | Status: DC
  Administered 2019-05-24: 10000 [IU] via SUBCUTANEOUS
  Filled 2019-05-24: qty 1

## 2019-06-07 ENCOUNTER — Other Ambulatory Visit: Payer: Self-pay

## 2019-06-07 ENCOUNTER — Ambulatory Visit (HOSPITAL_COMMUNITY)
Admission: RE | Admit: 2019-06-07 | Discharge: 2019-06-07 | Disposition: A | Discharge status: Home / Self Care | Payer: Medicare Other | Source: Ambulatory Visit | Attending: Nephrology | Admitting: Nephrology

## 2019-06-07 VITALS — BP 150/58 | HR 65 | Temp 97.1°F | Resp 20

## 2019-06-07 DIAGNOSIS — D638 Anemia in other chronic diseases classified elsewhere: Secondary | ICD-10-CM | POA: Diagnosis not present

## 2019-06-07 LAB — MAGNESIUM: Magnesium: 2.4 mg/dL (ref 1.7–2.4)

## 2019-06-07 LAB — RENAL FUNCTION PANEL
Albumin: 3.6 g/dL (ref 3.5–5.0)
Anion gap: 12 (ref 5–15)
BUN: 51 mg/dL — ABNORMAL HIGH (ref 8–23)
CO2: 23 mmol/L (ref 22–32)
Calcium: 9.2 mg/dL (ref 8.9–10.3)
Chloride: 107 mmol/L (ref 98–111)
Creatinine, Ser: 2.49 mg/dL — ABNORMAL HIGH (ref 0.61–1.24)
GFR calc Af Amer: 26 mL/min — ABNORMAL LOW (ref >60)
GFR calc non Af Amer: 22 mL/min — ABNORMAL LOW (ref >60)
Glucose, Bld: 135 mg/dL — ABNORMAL HIGH (ref 70–99)
Phosphorus: 4.9 mg/dL — ABNORMAL HIGH (ref 2.5–4.6)
Potassium: 4.3 mmol/L (ref 3.5–5.1)
Sodium: 142 mmol/L (ref 135–145)

## 2019-06-07 MED ORDER — EPOETIN ALFA-EPBX 10000 UNIT/ML IJ SOLN
10000.0000 [IU] | INTRAMUSCULAR | Status: DC
  Administered 2019-06-07: 09:00:00 10000 [IU] via SUBCUTANEOUS
  Filled 2019-06-07: qty 1

## 2019-06-07 NOTE — Progress Notes (Signed)
Istat used for hemocue as hemocue machine not working. Used the wrong green top tube resulting in a potassium of 8.5 which is in error. Reported to Dr Serita Grit office.

## 2019-06-21 ENCOUNTER — Ambulatory Visit (HOSPITAL_COMMUNITY)
Admission: RE | Admit: 2019-06-21 | Discharge: 2019-06-21 | Disposition: A | Discharge status: Home / Self Care | Payer: Medicare Other | Source: Ambulatory Visit | Attending: Nephrology | Admitting: Nephrology

## 2019-06-21 ENCOUNTER — Other Ambulatory Visit: Payer: Self-pay

## 2019-06-21 VITALS — BP 127/59 | HR 70 | Temp 95.6°F | Resp 20

## 2019-06-21 DIAGNOSIS — D638 Anemia in other chronic diseases classified elsewhere: Secondary | ICD-10-CM | POA: Diagnosis not present

## 2019-06-21 LAB — IRON AND TIBC
Iron: 62 ug/dL (ref 45–182)
Saturation Ratios: 24 % (ref 17.9–39.5)
TIBC: 253 ug/dL (ref 250–450)
UIBC: 191 ug/dL

## 2019-06-21 LAB — POCT HEMOGLOBIN-HEMACUE: Hemoglobin: 12 g/dL — ABNORMAL LOW (ref 13.0–17.0)

## 2019-06-21 LAB — FERRITIN: Ferritin: 122 ng/mL (ref 24–336)

## 2019-06-21 MED ORDER — EPOETIN ALFA-EPBX 10000 UNIT/ML IJ SOLN: 10000.0000 [IU] | INTRAMUSCULAR | Status: DC

## 2019-07-05 ENCOUNTER — Other Ambulatory Visit: Payer: Self-pay

## 2019-07-05 ENCOUNTER — Ambulatory Visit (HOSPITAL_COMMUNITY)
Admission: RE | Admit: 2019-07-05 | Discharge: 2019-07-05 | Disposition: A | Discharge status: Home / Self Care | Payer: Medicare Other | Source: Ambulatory Visit | Attending: Nephrology | Admitting: Nephrology

## 2019-07-05 VITALS — BP 132/49 | HR 69 | Temp 95.3°F | Resp 20

## 2019-07-05 DIAGNOSIS — D638 Anemia in other chronic diseases classified elsewhere: Secondary | ICD-10-CM | POA: Insufficient documentation

## 2019-07-05 LAB — POCT HEMOGLOBIN-HEMACUE: Hemoglobin: 11.2 g/dL — ABNORMAL LOW (ref 13.0–17.0)

## 2019-07-05 MED ORDER — EPOETIN ALFA-EPBX 10000 UNIT/ML IJ SOLN
10000.0000 [IU] | INTRAMUSCULAR | Status: DC
  Administered 2019-07-05: 10000 [IU] via SUBCUTANEOUS

## 2019-07-05 MED ORDER — EPOETIN ALFA-EPBX 10000 UNIT/ML IJ SOLN
INTRAMUSCULAR | Status: AC
  Filled 2019-07-05: qty 1

## 2019-07-19 ENCOUNTER — Encounter (HOSPITAL_COMMUNITY)
Admission: RE | Admit: 2019-07-19 | Discharge: 2019-07-19 | Disposition: A | Discharge status: Home / Self Care | Payer: Medicare Other | Source: Ambulatory Visit | Attending: Nephrology | Admitting: Nephrology

## 2019-07-19 ENCOUNTER — Other Ambulatory Visit: Payer: Self-pay

## 2019-07-19 VITALS — BP 163/62 | HR 74 | Temp 96.3°F | Resp 20

## 2019-07-19 DIAGNOSIS — D638 Anemia in other chronic diseases classified elsewhere: Secondary | ICD-10-CM | POA: Insufficient documentation

## 2019-07-19 LAB — RENAL FUNCTION PANEL
Albumin: 3.4 g/dL — ABNORMAL LOW (ref 3.5–5.0)
Anion gap: 12 (ref 5–15)
BUN: 53 mg/dL — ABNORMAL HIGH (ref 8–23)
CO2: 24 mmol/L (ref 22–32)
Calcium: 8.9 mg/dL (ref 8.9–10.3)
Chloride: 108 mmol/L (ref 98–111)
Creatinine, Ser: 2.22 mg/dL — ABNORMAL HIGH (ref 0.61–1.24)
GFR calc Af Amer: 29 mL/min — ABNORMAL LOW (ref >60)
GFR calc non Af Amer: 25 mL/min — ABNORMAL LOW (ref >60)
Glucose, Bld: 111 mg/dL — ABNORMAL HIGH (ref 70–99)
Phosphorus: 4.5 mg/dL (ref 2.5–4.6)
Potassium: 4.1 mmol/L (ref 3.5–5.1)
Sodium: 144 mmol/L (ref 135–145)

## 2019-07-19 LAB — IRON AND TIBC
Iron: 82 ug/dL (ref 45–182)
Saturation Ratios: 31 % (ref 17.9–39.5)
TIBC: 260 ug/dL (ref 250–450)
UIBC: 178 ug/dL

## 2019-07-19 LAB — FERRITIN: Ferritin: 133 ng/mL (ref 24–336)

## 2019-07-19 LAB — MAGNESIUM: Magnesium: 2.3 mg/dL (ref 1.7–2.4)

## 2019-07-19 MED ORDER — EPOETIN ALFA-EPBX 10000 UNIT/ML IJ SOLN
10000.0000 [IU] | INTRAMUSCULAR | Status: DC
  Administered 2019-07-19: 10000 [IU] via SUBCUTANEOUS

## 2019-07-19 MED ORDER — EPOETIN ALFA-EPBX 10000 UNIT/ML IJ SOLN
INTRAMUSCULAR | Status: AC
  Filled 2019-07-19: qty 1

## 2019-07-20 LAB — POCT HEMOGLOBIN-HEMACUE: Hemoglobin: 10.8 g/dL — ABNORMAL LOW (ref 13.0–17.0)

## 2019-08-02 ENCOUNTER — Other Ambulatory Visit: Payer: Self-pay

## 2019-08-02 ENCOUNTER — Encounter (HOSPITAL_COMMUNITY)
Admission: RE | Admit: 2019-08-02 | Discharge: 2019-08-02 | Disposition: A | Discharge status: Home / Self Care | Payer: Medicare Other | Source: Ambulatory Visit | Attending: Nephrology | Admitting: Nephrology

## 2019-08-02 VITALS — BP 119/48 | HR 61 | Temp 97.0°F | Resp 20

## 2019-08-02 DIAGNOSIS — D638 Anemia in other chronic diseases classified elsewhere: Secondary | ICD-10-CM

## 2019-08-02 LAB — POCT HEMOGLOBIN-HEMACUE: Hemoglobin: 11.2 g/dL — ABNORMAL LOW (ref 13.0–17.0)

## 2019-08-02 MED ORDER — EPOETIN ALFA-EPBX 10000 UNIT/ML IJ SOLN
10000.0000 [IU] | INTRAMUSCULAR | Status: DC
  Administered 2019-08-02: 10000 [IU] via SUBCUTANEOUS

## 2019-08-02 MED ORDER — EPOETIN ALFA-EPBX 10000 UNIT/ML IJ SOLN
INTRAMUSCULAR | Status: AC
  Filled 2019-08-02: qty 1

## 2019-08-16 ENCOUNTER — Other Ambulatory Visit: Payer: Self-pay

## 2019-08-16 ENCOUNTER — Ambulatory Visit (HOSPITAL_COMMUNITY)
Admission: RE | Admit: 2019-08-16 | Discharge: 2019-08-16 | Disposition: A | Discharge status: Home / Self Care | Payer: Medicare Other | Source: Ambulatory Visit | Attending: Nephrology | Admitting: Nephrology

## 2019-08-16 VITALS — BP 130/43 | HR 59 | Temp 96.1°F | Resp 20

## 2019-08-16 DIAGNOSIS — D638 Anemia in other chronic diseases classified elsewhere: Secondary | ICD-10-CM | POA: Insufficient documentation

## 2019-08-16 LAB — RENAL FUNCTION PANEL
Albumin: 3.4 g/dL — ABNORMAL LOW (ref 3.5–5.0)
Anion gap: 11 (ref 5–15)
BUN: 51 mg/dL — ABNORMAL HIGH (ref 8–23)
CO2: 24 mmol/L (ref 22–32)
Calcium: 9.3 mg/dL (ref 8.9–10.3)
Chloride: 106 mmol/L (ref 98–111)
Creatinine, Ser: 2.67 mg/dL — ABNORMAL HIGH (ref 0.61–1.24)
GFR calc Af Amer: 23 mL/min — ABNORMAL LOW (ref >60)
GFR calc non Af Amer: 20 mL/min — ABNORMAL LOW (ref >60)
Glucose, Bld: 144 mg/dL — ABNORMAL HIGH (ref 70–99)
Phosphorus: 5 mg/dL — ABNORMAL HIGH (ref 2.5–4.6)
Potassium: 4.4 mmol/L (ref 3.5–5.1)
Sodium: 141 mmol/L (ref 135–145)

## 2019-08-16 LAB — IRON AND TIBC
Iron: 74 ug/dL (ref 45–182)
Saturation Ratios: 29 % (ref 17.9–39.5)
TIBC: 252 ug/dL (ref 250–450)
UIBC: 178 ug/dL

## 2019-08-16 LAB — MAGNESIUM: Magnesium: 2.5 mg/dL — ABNORMAL HIGH (ref 1.7–2.4)

## 2019-08-16 LAB — POCT HEMOGLOBIN-HEMACUE: Hemoglobin: 11 g/dL — ABNORMAL LOW (ref 13.0–17.0)

## 2019-08-16 LAB — FERRITIN: Ferritin: 148 ng/mL (ref 24–336)

## 2019-08-16 MED ORDER — EPOETIN ALFA-EPBX 10000 UNIT/ML IJ SOLN
INTRAMUSCULAR | Status: AC
  Administered 2019-08-16: 10000 [IU] via SUBCUTANEOUS
  Filled 2019-08-16: qty 1

## 2019-08-16 MED ORDER — EPOETIN ALFA-EPBX 10000 UNIT/ML IJ SOLN: 10000.0000 [IU] | INTRAMUSCULAR | Status: DC

## 2019-08-27 DIAGNOSIS — N184 Chronic kidney disease, stage 4 (severe): Secondary | ICD-10-CM | POA: Diagnosis not present

## 2019-08-27 DIAGNOSIS — D631 Anemia in chronic kidney disease: Secondary | ICD-10-CM | POA: Diagnosis not present

## 2019-08-27 DIAGNOSIS — N2581 Secondary hyperparathyroidism of renal origin: Secondary | ICD-10-CM | POA: Diagnosis not present

## 2019-08-27 DIAGNOSIS — I129 Hypertensive chronic kidney disease with stage 1 through stage 4 chronic kidney disease, or unspecified chronic kidney disease: Secondary | ICD-10-CM | POA: Diagnosis not present

## 2019-08-30 ENCOUNTER — Ambulatory Visit (HOSPITAL_COMMUNITY)
Admission: RE | Admit: 2019-08-30 | Discharge: 2019-08-30 | Disposition: A | Discharge status: Home / Self Care | Payer: Medicare Other | Source: Ambulatory Visit | Attending: Nephrology | Admitting: Nephrology

## 2019-08-30 ENCOUNTER — Other Ambulatory Visit: Payer: Self-pay

## 2019-08-30 VITALS — BP 136/48 | HR 58 | Temp 96.8°F | Resp 20

## 2019-08-30 DIAGNOSIS — D638 Anemia in other chronic diseases classified elsewhere: Secondary | ICD-10-CM | POA: Insufficient documentation

## 2019-08-30 LAB — POCT HEMOGLOBIN-HEMACUE: Hemoglobin: 10.3 g/dL — ABNORMAL LOW (ref 13.0–17.0)

## 2019-08-30 MED ORDER — EPOETIN ALFA-EPBX 10000 UNIT/ML IJ SOLN
INTRAMUSCULAR | Status: AC
  Administered 2019-08-30: 10000 [IU]
  Filled 2019-08-30: qty 1

## 2019-08-30 MED ORDER — EPOETIN ALFA-EPBX 10000 UNIT/ML IJ SOLN: 10000.0000 [IU] | INTRAMUSCULAR | Status: DC

## 2019-09-12 ENCOUNTER — Other Ambulatory Visit: Payer: Self-pay

## 2019-09-12 ENCOUNTER — Ambulatory Visit (INDEPENDENT_AMBULATORY_CARE_PROVIDER_SITE_OTHER): Payer: Medicare Other | Admitting: Podiatry

## 2019-09-12 ENCOUNTER — Encounter: Payer: Self-pay | Admitting: Podiatry

## 2019-09-12 VITALS — Temp 97.0°F

## 2019-09-12 DIAGNOSIS — M79674 Pain in right toe(s): Secondary | ICD-10-CM | POA: Diagnosis not present

## 2019-09-12 DIAGNOSIS — L84 Corns and callosities: Secondary | ICD-10-CM

## 2019-09-12 DIAGNOSIS — B351 Tinea unguium: Secondary | ICD-10-CM

## 2019-09-12 DIAGNOSIS — L97511 Non-pressure chronic ulcer of other part of right foot limited to breakdown of skin: Secondary | ICD-10-CM

## 2019-09-12 DIAGNOSIS — M79675 Pain in left toe(s): Secondary | ICD-10-CM | POA: Diagnosis not present

## 2019-09-12 DIAGNOSIS — E1142 Type 2 diabetes mellitus with diabetic polyneuropathy: Secondary | ICD-10-CM

## 2019-09-12 NOTE — Progress Notes (Signed)
This patient returns to my office for at risk foot care.  This patient requires this care by a professional since this patient will be at risk due to having diabetes.  He has chronic ulcer under the ball of his right big toe.  He says there is no drainage today but had  redness and swelling which resolved with soaks in H2O2  This patient is unable to cut nails themselves since the patient cannot reach their nails.These nails are painful walking and wearing shoes.  This patient presents for at risk foot care today.  General Appearance  Alert, conversant and in no acute stress.  Vascular  Dorsalis pedis and posterior tibial  pulses are palpable  bilaterally.  Capillary return is within normal limits  bilaterally. Temperature is within normal limits  bilaterally.  Neurologic  Senn-Weinstein monofilament wire test diminished   bilaterally. Muscle power within normal limits bilaterally.  Nails Thick disfigured discolored nails with subungual debris  from hallux to fifth toes bilaterally. No evidence of bacterial infection or drainage bilaterally.  Orthopedic  No limitations of motion  feet .  No crepitus or effusions noted.  No bony pathology or digital deformities noted.  Skin  normotropic skin with no porokeratosis noted bilaterally.  Necrotic tissue noted under 1st MPJ  Right foot.  No redness or swelling or drainage noted.  Onychomycosis  Pain in right toe  Pain in left toe. Chronic ulcer right foot.  Consent was obtained for treatment procedures.  Debridement and grinding of long thick nails with clearing of subungual debris.  Debride hemorrhagic tissue sub 1st MPJ right foot.     Return office visit  10 weeks.        Told patient to return for periodic foot care and evaluation due to potential at risk complications.   Gardiner Barefoot DPM

## 2019-09-13 ENCOUNTER — Encounter (HOSPITAL_COMMUNITY)
Admission: RE | Admit: 2019-09-13 | Discharge: 2019-09-13 | Disposition: A | Discharge status: Home / Self Care | Payer: Medicare Other | Source: Ambulatory Visit | Attending: Nephrology | Admitting: Nephrology

## 2019-09-13 VITALS — BP 163/55 | HR 63 | Resp 20

## 2019-09-13 DIAGNOSIS — D638 Anemia in other chronic diseases classified elsewhere: Secondary | ICD-10-CM | POA: Diagnosis not present

## 2019-09-13 LAB — IRON AND TIBC
Iron: 65 ug/dL (ref 45–182)
Saturation Ratios: 23 % (ref 17.9–39.5)
TIBC: 286 ug/dL (ref 250–450)
UIBC: 221 ug/dL

## 2019-09-13 LAB — RENAL FUNCTION PANEL
Albumin: 3.2 g/dL — ABNORMAL LOW (ref 3.5–5.0)
Anion gap: 10 (ref 5–15)
BUN: 46 mg/dL — ABNORMAL HIGH (ref 8–23)
CO2: 23 mmol/L (ref 22–32)
Calcium: 8.9 mg/dL (ref 8.9–10.3)
Chloride: 109 mmol/L (ref 98–111)
Creatinine, Ser: 1.98 mg/dL — ABNORMAL HIGH (ref 0.61–1.24)
GFR calc Af Amer: 34 mL/min — ABNORMAL LOW (ref >60)
GFR calc non Af Amer: 29 mL/min — ABNORMAL LOW (ref >60)
Glucose, Bld: 103 mg/dL — ABNORMAL HIGH (ref 70–99)
Phosphorus: 4.6 mg/dL (ref 2.5–4.6)
Potassium: 4.7 mmol/L (ref 3.5–5.1)
Sodium: 142 mmol/L (ref 135–145)

## 2019-09-13 LAB — POCT HEMOGLOBIN-HEMACUE: Hemoglobin: 10.4 g/dL — ABNORMAL LOW (ref 13.0–17.0)

## 2019-09-13 LAB — MAGNESIUM: Magnesium: 2.3 mg/dL (ref 1.7–2.4)

## 2019-09-13 LAB — FERRITIN: Ferritin: 116 ng/mL (ref 24–336)

## 2019-09-13 MED ORDER — EPOETIN ALFA-EPBX 10000 UNIT/ML IJ SOLN
10000.0000 [IU] | INTRAMUSCULAR | Status: DC
  Administered 2019-09-13: 10000 [IU] via SUBCUTANEOUS

## 2019-09-13 MED ORDER — EPOETIN ALFA-EPBX 10000 UNIT/ML IJ SOLN
INTRAMUSCULAR | Status: AC
  Filled 2019-09-13: qty 1

## 2019-09-26 ENCOUNTER — Other Ambulatory Visit (HOSPITAL_COMMUNITY): Payer: Self-pay | Admitting: *Deleted

## 2019-09-27 ENCOUNTER — Other Ambulatory Visit: Payer: Self-pay

## 2019-09-27 ENCOUNTER — Ambulatory Visit (HOSPITAL_COMMUNITY)
Admission: RE | Admit: 2019-09-27 | Discharge: 2019-09-27 | Disposition: A | Discharge status: Home / Self Care | Payer: Medicare Other | Source: Ambulatory Visit | Attending: Nephrology | Admitting: Nephrology

## 2019-09-27 VITALS — BP 138/45 | HR 68 | Temp 95.1°F | Resp 20

## 2019-09-27 DIAGNOSIS — D638 Anemia in other chronic diseases classified elsewhere: Secondary | ICD-10-CM | POA: Diagnosis not present

## 2019-09-27 LAB — POCT HEMOGLOBIN-HEMACUE: Hemoglobin: 10.5 g/dL — ABNORMAL LOW (ref 13.0–17.0)

## 2019-09-27 MED ORDER — EPOETIN ALFA-EPBX 10000 UNIT/ML IJ SOLN: 10000.0000 [IU] | INTRAMUSCULAR | Status: DC

## 2019-09-27 MED ORDER — EPOETIN ALFA-EPBX 10000 UNIT/ML IJ SOLN
INTRAMUSCULAR | Status: AC
  Administered 2019-09-27: 10000 [IU] via SUBCUTANEOUS
  Filled 2019-09-27: qty 1

## 2019-10-09 DIAGNOSIS — C44311 Basal cell carcinoma of skin of nose: Secondary | ICD-10-CM | POA: Diagnosis not present

## 2019-10-09 DIAGNOSIS — D485 Neoplasm of uncertain behavior of skin: Secondary | ICD-10-CM | POA: Diagnosis not present

## 2019-10-09 DIAGNOSIS — Z85828 Personal history of other malignant neoplasm of skin: Secondary | ICD-10-CM | POA: Diagnosis not present

## 2019-10-09 DIAGNOSIS — C44619 Basal cell carcinoma of skin of left upper limb, including shoulder: Secondary | ICD-10-CM | POA: Diagnosis not present

## 2019-10-09 DIAGNOSIS — L570 Actinic keratosis: Secondary | ICD-10-CM | POA: Diagnosis not present

## 2019-10-09 DIAGNOSIS — D0439 Carcinoma in situ of skin of other parts of face: Secondary | ICD-10-CM | POA: Diagnosis not present

## 2019-10-11 ENCOUNTER — Other Ambulatory Visit: Payer: Self-pay

## 2019-10-11 ENCOUNTER — Ambulatory Visit (HOSPITAL_COMMUNITY)
Admission: RE | Admit: 2019-10-11 | Discharge: 2019-10-11 | Disposition: A | Discharge status: Home / Self Care | Payer: Medicare Other | Source: Ambulatory Visit | Attending: Nephrology | Admitting: Nephrology

## 2019-10-11 VITALS — BP 126/43 | HR 61 | Temp 95.6°F | Resp 20

## 2019-10-11 DIAGNOSIS — D638 Anemia in other chronic diseases classified elsewhere: Secondary | ICD-10-CM | POA: Insufficient documentation

## 2019-10-11 LAB — FERRITIN: Ferritin: 131 ng/mL (ref 24–336)

## 2019-10-11 LAB — RENAL FUNCTION PANEL
Albumin: 3.4 g/dL — ABNORMAL LOW (ref 3.5–5.0)
Anion gap: 14 (ref 5–15)
BUN: 66 mg/dL — ABNORMAL HIGH (ref 8–23)
CO2: 22 mmol/L (ref 22–32)
Calcium: 8.9 mg/dL (ref 8.9–10.3)
Chloride: 107 mmol/L (ref 98–111)
Creatinine, Ser: 2.66 mg/dL — ABNORMAL HIGH (ref 0.61–1.24)
GFR calc Af Amer: 24 mL/min — ABNORMAL LOW (ref >60)
GFR calc non Af Amer: 20 mL/min — ABNORMAL LOW (ref >60)
Glucose, Bld: 119 mg/dL — ABNORMAL HIGH (ref 70–99)
Phosphorus: 5.9 mg/dL — ABNORMAL HIGH (ref 2.5–4.6)
Potassium: 4.4 mmol/L (ref 3.5–5.1)
Sodium: 143 mmol/L (ref 135–145)

## 2019-10-11 LAB — IRON AND TIBC
Iron: 75 ug/dL (ref 45–182)
Saturation Ratios: 25 % (ref 17.9–39.5)
TIBC: 295 ug/dL (ref 250–450)
UIBC: 220 ug/dL

## 2019-10-11 LAB — POCT HEMOGLOBIN-HEMACUE: Hemoglobin: 10.3 g/dL — ABNORMAL LOW (ref 13.0–17.0)

## 2019-10-11 LAB — MAGNESIUM: Magnesium: 2.5 mg/dL — ABNORMAL HIGH (ref 1.7–2.4)

## 2019-10-11 MED ORDER — EPOETIN ALFA-EPBX 10000 UNIT/ML IJ SOLN
10000.0000 [IU] | INTRAMUSCULAR | Status: DC
  Administered 2019-10-11: 10000 [IU] via SUBCUTANEOUS

## 2019-10-11 MED ORDER — EPOETIN ALFA-EPBX 10000 UNIT/ML IJ SOLN
INTRAMUSCULAR | Status: AC
  Filled 2019-10-11: qty 1

## 2019-10-25 ENCOUNTER — Ambulatory Visit (HOSPITAL_COMMUNITY)
Admission: RE | Admit: 2019-10-25 | Discharge: 2019-10-25 | Disposition: A | Discharge status: Home / Self Care | Payer: Medicare Other | Source: Ambulatory Visit | Attending: Nephrology | Admitting: Nephrology

## 2019-10-25 ENCOUNTER — Other Ambulatory Visit: Payer: Self-pay

## 2019-10-25 VITALS — BP 133/48 | HR 64 | Temp 96.4°F | Resp 20

## 2019-10-25 DIAGNOSIS — D638 Anemia in other chronic diseases classified elsewhere: Secondary | ICD-10-CM | POA: Diagnosis not present

## 2019-10-25 LAB — POCT HEMOGLOBIN-HEMACUE: Hemoglobin: 10.5 g/dL — ABNORMAL LOW (ref 13.0–17.0)

## 2019-10-25 MED ORDER — EPOETIN ALFA-EPBX 10000 UNIT/ML IJ SOLN: 10000.0000 [IU] | INTRAMUSCULAR | Status: DC

## 2019-10-25 MED ORDER — EPOETIN ALFA-EPBX 10000 UNIT/ML IJ SOLN
INTRAMUSCULAR | Status: AC
  Administered 2019-10-25: 10000 [IU] via SUBCUTANEOUS
  Filled 2019-10-25: qty 1

## 2019-11-08 ENCOUNTER — Encounter (HOSPITAL_COMMUNITY)
Admission: RE | Admit: 2019-11-08 | Discharge: 2019-11-08 | Disposition: A | Discharge status: Home / Self Care | Payer: Medicare Other | Source: Ambulatory Visit | Attending: Nephrology | Admitting: Nephrology

## 2019-11-08 ENCOUNTER — Other Ambulatory Visit: Payer: Self-pay

## 2019-11-08 VITALS — BP 139/51 | HR 61 | Temp 96.6°F | Resp 20

## 2019-11-08 DIAGNOSIS — D638 Anemia in other chronic diseases classified elsewhere: Secondary | ICD-10-CM | POA: Diagnosis not present

## 2019-11-08 LAB — POCT HEMOGLOBIN-HEMACUE: Hemoglobin: 10 g/dL — ABNORMAL LOW (ref 13.0–17.0)

## 2019-11-08 LAB — MAGNESIUM: Magnesium: 2.3 mg/dL (ref 1.7–2.4)

## 2019-11-08 LAB — FERRITIN: Ferritin: 100 ng/mL (ref 24–336)

## 2019-11-08 LAB — RENAL FUNCTION PANEL
Albumin: 3.1 g/dL — ABNORMAL LOW (ref 3.5–5.0)
Anion gap: 9 (ref 5–15)
BUN: 58 mg/dL — ABNORMAL HIGH (ref 8–23)
CO2: 23 mmol/L (ref 22–32)
Calcium: 8.6 mg/dL — ABNORMAL LOW (ref 8.9–10.3)
Chloride: 110 mmol/L (ref 98–111)
Creatinine, Ser: 2.71 mg/dL — ABNORMAL HIGH (ref 0.61–1.24)
GFR calc Af Amer: 23 mL/min — ABNORMAL LOW (ref >60)
GFR calc non Af Amer: 20 mL/min — ABNORMAL LOW (ref >60)
Glucose, Bld: 117 mg/dL — ABNORMAL HIGH (ref 70–99)
Phosphorus: 4.6 mg/dL (ref 2.5–4.6)
Potassium: 4.3 mmol/L (ref 3.5–5.1)
Sodium: 142 mmol/L (ref 135–145)

## 2019-11-08 LAB — IRON AND TIBC
Iron: 48 ug/dL (ref 45–182)
Saturation Ratios: 19 % (ref 17.9–39.5)
TIBC: 259 ug/dL (ref 250–450)
UIBC: 211 ug/dL

## 2019-11-08 MED ORDER — EPOETIN ALFA-EPBX 10000 UNIT/ML IJ SOLN
10000.0000 [IU] | INTRAMUSCULAR | Status: DC
  Administered 2019-11-08: 10000 [IU] via SUBCUTANEOUS

## 2019-11-08 MED ORDER — EPOETIN ALFA-EPBX 10000 UNIT/ML IJ SOLN
INTRAMUSCULAR | Status: AC
  Filled 2019-11-08: qty 1

## 2019-11-22 ENCOUNTER — Encounter (HOSPITAL_COMMUNITY)
Admission: RE | Admit: 2019-11-22 | Discharge: 2019-11-22 | Disposition: A | Discharge status: Home / Self Care | Payer: Medicare Other | Source: Ambulatory Visit | Attending: Nephrology | Admitting: Nephrology

## 2019-11-22 ENCOUNTER — Other Ambulatory Visit: Payer: Self-pay

## 2019-11-22 VITALS — BP 131/52 | HR 67 | Temp 96.4°F | Resp 20

## 2019-11-22 DIAGNOSIS — D638 Anemia in other chronic diseases classified elsewhere: Secondary | ICD-10-CM

## 2019-11-22 LAB — POCT HEMOGLOBIN-HEMACUE: Hemoglobin: 10.4 g/dL — ABNORMAL LOW (ref 13.0–17.0)

## 2019-11-22 MED ORDER — EPOETIN ALFA-EPBX 10000 UNIT/ML IJ SOLN: 10000.0000 [IU] | INTRAMUSCULAR | Status: DC

## 2019-11-22 MED ORDER — EPOETIN ALFA-EPBX 10000 UNIT/ML IJ SOLN
INTRAMUSCULAR | Status: AC
  Administered 2019-11-22: 10000 [IU] via SUBCUTANEOUS
  Filled 2019-11-22: qty 1

## 2019-12-06 ENCOUNTER — Encounter (HOSPITAL_COMMUNITY)
Admission: RE | Admit: 2019-12-06 | Discharge: 2019-12-06 | Disposition: A | Discharge status: Home / Self Care | Payer: Medicare Other | Source: Ambulatory Visit | Attending: Nephrology | Admitting: Nephrology

## 2019-12-06 ENCOUNTER — Other Ambulatory Visit: Payer: Self-pay

## 2019-12-06 VITALS — BP 132/48 | HR 64 | Temp 95.4°F | Resp 20

## 2019-12-06 DIAGNOSIS — D638 Anemia in other chronic diseases classified elsewhere: Secondary | ICD-10-CM

## 2019-12-06 LAB — RENAL FUNCTION PANEL
Albumin: 3.2 g/dL — ABNORMAL LOW (ref 3.5–5.0)
Anion gap: 11 (ref 5–15)
BUN: 61 mg/dL — ABNORMAL HIGH (ref 8–23)
CO2: 24 mmol/L (ref 22–32)
Calcium: 8.9 mg/dL (ref 8.9–10.3)
Chloride: 108 mmol/L (ref 98–111)
Creatinine, Ser: 2.54 mg/dL — ABNORMAL HIGH (ref 0.61–1.24)
GFR calc Af Amer: 25 mL/min — ABNORMAL LOW (ref >60)
GFR calc non Af Amer: 22 mL/min — ABNORMAL LOW (ref >60)
Glucose, Bld: 118 mg/dL — ABNORMAL HIGH (ref 70–99)
Phosphorus: 4.7 mg/dL — ABNORMAL HIGH (ref 2.5–4.6)
Potassium: 4.4 mmol/L (ref 3.5–5.1)
Sodium: 143 mmol/L (ref 135–145)

## 2019-12-06 LAB — MAGNESIUM: Magnesium: 2.3 mg/dL (ref 1.7–2.4)

## 2019-12-06 LAB — FERRITIN: Ferritin: 120 ng/mL (ref 24–336)

## 2019-12-06 LAB — IRON AND TIBC
Iron: 59 ug/dL (ref 45–182)
Saturation Ratios: 22 % (ref 17.9–39.5)
TIBC: 273 ug/dL (ref 250–450)
UIBC: 214 ug/dL

## 2019-12-06 LAB — POCT HEMOGLOBIN-HEMACUE: Hemoglobin: 10.5 g/dL — ABNORMAL LOW (ref 13.0–17.0)

## 2019-12-06 MED ORDER — EPOETIN ALFA-EPBX 10000 UNIT/ML IJ SOLN: 10000.0000 [IU] | INTRAMUSCULAR | Status: DC

## 2019-12-06 MED ORDER — EPOETIN ALFA-EPBX 10000 UNIT/ML IJ SOLN
INTRAMUSCULAR | Status: AC
  Administered 2019-12-06: 10000 [IU]
  Filled 2019-12-06: qty 1

## 2019-12-20 ENCOUNTER — Other Ambulatory Visit (HOSPITAL_COMMUNITY): Payer: Self-pay

## 2019-12-20 ENCOUNTER — Other Ambulatory Visit: Payer: Self-pay

## 2019-12-20 ENCOUNTER — Encounter (HOSPITAL_COMMUNITY)
Admission: RE | Admit: 2019-12-20 | Discharge: 2019-12-20 | Disposition: A | Discharge status: Home / Self Care | Payer: Medicare Other | Source: Ambulatory Visit | Attending: Nephrology | Admitting: Nephrology

## 2019-12-20 VITALS — BP 117/50 | HR 69 | Temp 96.7°F | Resp 18

## 2019-12-20 DIAGNOSIS — D638 Anemia in other chronic diseases classified elsewhere: Secondary | ICD-10-CM

## 2019-12-20 LAB — POCT HEMOGLOBIN-HEMACUE: Hemoglobin: 11.1 g/dL — ABNORMAL LOW (ref 13.0–17.0)

## 2019-12-20 MED ORDER — EPOETIN ALFA-EPBX 10000 UNIT/ML IJ SOLN
INTRAMUSCULAR | Status: AC
  Administered 2019-12-20: 10000 [IU]
  Filled 2019-12-20: qty 1

## 2019-12-20 MED ORDER — EPOETIN ALFA-EPBX 10000 UNIT/ML IJ SOLN: 10000.0000 [IU] | INTRAMUSCULAR | Status: DC

## 2019-12-31 DIAGNOSIS — Z1331 Encounter for screening for depression: Secondary | ICD-10-CM | POA: Diagnosis not present

## 2019-12-31 DIAGNOSIS — Z1339 Encounter for screening examination for other mental health and behavioral disorders: Secondary | ICD-10-CM | POA: Diagnosis not present

## 2020-01-03 ENCOUNTER — Encounter (HOSPITAL_COMMUNITY): Payer: PRIVATE HEALTH INSURANCE

## 2020-01-03 ENCOUNTER — Other Ambulatory Visit: Payer: Self-pay

## 2020-01-03 ENCOUNTER — Encounter (HOSPITAL_COMMUNITY)
Admission: RE | Admit: 2020-01-03 | Discharge: 2020-01-03 | Disposition: A | Discharge status: Home / Self Care | Payer: Medicare Other | Source: Ambulatory Visit | Attending: Nephrology | Admitting: Nephrology

## 2020-01-03 VITALS — BP 144/48 | HR 55 | Temp 97.2°F | Resp 18 | Ht 73.0 in | Wt 199.0 lb

## 2020-01-03 DIAGNOSIS — D638 Anemia in other chronic diseases classified elsewhere: Secondary | ICD-10-CM | POA: Insufficient documentation

## 2020-01-03 LAB — POCT HEMOGLOBIN-HEMACUE: Hemoglobin: 10.3 g/dL — ABNORMAL LOW (ref 13.0–17.0)

## 2020-01-03 LAB — RENAL FUNCTION PANEL
Albumin: 3.5 g/dL (ref 3.5–5.0)
Anion gap: 10 (ref 5–15)
BUN: 76 mg/dL — ABNORMAL HIGH (ref 8–23)
CO2: 24 mmol/L (ref 22–32)
Calcium: 9.4 mg/dL (ref 8.9–10.3)
Chloride: 109 mmol/L (ref 98–111)
Creatinine, Ser: 2.52 mg/dL — ABNORMAL HIGH (ref 0.61–1.24)
GFR calc Af Amer: 25 mL/min — ABNORMAL LOW (ref >60)
GFR calc non Af Amer: 22 mL/min — ABNORMAL LOW (ref >60)
Glucose, Bld: 114 mg/dL — ABNORMAL HIGH (ref 70–99)
Phosphorus: 4.4 mg/dL (ref 2.5–4.6)
Potassium: 4.2 mmol/L (ref 3.5–5.1)
Sodium: 143 mmol/L (ref 135–145)

## 2020-01-03 LAB — MAGNESIUM: Magnesium: 2.7 mg/dL — ABNORMAL HIGH (ref 1.7–2.4)

## 2020-01-03 MED ORDER — EPOETIN ALFA-EPBX 10000 UNIT/ML IJ SOLN
10000.0000 [IU] | INTRAMUSCULAR | Status: DC
  Administered 2020-01-03: 10000 [IU] via SUBCUTANEOUS

## 2020-01-03 MED ORDER — EPOETIN ALFA-EPBX 10000 UNIT/ML IJ SOLN
INTRAMUSCULAR | Status: AC
  Filled 2020-01-03: qty 1

## 2020-01-03 MED ORDER — SODIUM CHLORIDE 0.9 % IV SOLN
510.0000 mg | INTRAVENOUS | Status: DC
  Administered 2020-01-03: 510 mg via INTRAVENOUS
  Filled 2020-01-03: qty 17

## 2020-01-09 ENCOUNTER — Other Ambulatory Visit: Payer: Self-pay

## 2020-01-09 ENCOUNTER — Ambulatory Visit (INDEPENDENT_AMBULATORY_CARE_PROVIDER_SITE_OTHER): Payer: Medicare Other | Admitting: Podiatry

## 2020-01-09 ENCOUNTER — Encounter: Payer: Self-pay | Admitting: Podiatry

## 2020-01-09 DIAGNOSIS — B351 Tinea unguium: Secondary | ICD-10-CM

## 2020-01-09 DIAGNOSIS — L84 Corns and callosities: Secondary | ICD-10-CM

## 2020-01-09 DIAGNOSIS — M79674 Pain in right toe(s): Secondary | ICD-10-CM | POA: Diagnosis not present

## 2020-01-09 DIAGNOSIS — E1142 Type 2 diabetes mellitus with diabetic polyneuropathy: Secondary | ICD-10-CM | POA: Diagnosis not present

## 2020-01-09 DIAGNOSIS — M79675 Pain in left toe(s): Secondary | ICD-10-CM

## 2020-01-09 NOTE — Progress Notes (Signed)
This patient returns to my office for at risk foot care.  This patient requires this care by a professional since this patient will be at risk due to having diabetes.  He has chronic ulcer under the ball of his right big toe.  He says there is minimal drainage today but had  redness and swelling which resolved   This patient is unable to cut nails themselves since the patient cannot reach their nails.These nails are painful walking and wearing shoes.  This patient presents for at risk foot care today.   General Appearance  Alert, conversant and in no acute stress.  Vascular  Dorsalis pedis and posterior tibial  pulses are palpable  bilaterally.  Capillary return is within normal limits  bilaterally. Temperature is within normal limits  bilaterally.  Neurologic  Senn-Weinstein monofilament wire test diminished   bilaterally. Muscle power within normal limits bilaterally.  Nails Thick disfigured discolored nails with subungual debris  from hallux to fifth toes bilaterally. No evidence of bacterial infection or drainage bilaterally.  Orthopedic  No limitations of motion  feet .  No crepitus or effusions noted.  No bony pathology or digital deformities noted.  Skin  normotropic skin with no porokeratosis noted bilaterally.  Necrotic tissue noted under 1st MPJ  Right foot.  No redness or swelling or drainage noted.  Onychomycosis  Pain in right toe  Pain in left toe. Chronic ulcer right foot.  Consent was obtained for treatment procedures.  Debridement and grinding of long thick nails with clearing of subungual debris with nail nipper and dremel tool. Debride hemorrhagic tissue sub 1st MPJ right foot using a # 15 blade.  Neosporin/DSD applied..     Return office visit  10 weeks.        Told patient to return for periodic foot care and evaluation due to potential at risk complications.   Gardiner Barefoot DPM

## 2020-01-17 ENCOUNTER — Other Ambulatory Visit: Payer: Self-pay

## 2020-01-17 ENCOUNTER — Encounter (HOSPITAL_COMMUNITY)
Admission: RE | Admit: 2020-01-17 | Discharge: 2020-01-17 | Disposition: A | Discharge status: Home / Self Care | Payer: Medicare Other | Source: Ambulatory Visit | Attending: Nephrology | Admitting: Nephrology

## 2020-01-17 VITALS — BP 140/41 | HR 55 | Temp 97.3°F | Resp 20

## 2020-01-17 DIAGNOSIS — D638 Anemia in other chronic diseases classified elsewhere: Secondary | ICD-10-CM

## 2020-01-17 LAB — POCT HEMOGLOBIN-HEMACUE: Hemoglobin: 11.4 g/dL — ABNORMAL LOW (ref 13.0–17.0)

## 2020-01-17 MED ORDER — SODIUM CHLORIDE 0.9 % IV SOLN
510.0000 mg | INTRAVENOUS | Status: AC
  Administered 2020-01-17: 510 mg via INTRAVENOUS
  Filled 2020-01-17: qty 17

## 2020-01-17 MED ORDER — EPOETIN ALFA-EPBX 10000 UNIT/ML IJ SOLN
INTRAMUSCULAR | Status: AC
  Filled 2020-01-17: qty 1

## 2020-01-17 MED ORDER — EPOETIN ALFA-EPBX 10000 UNIT/ML IJ SOLN
10000.0000 [IU] | INTRAMUSCULAR | Status: DC
  Administered 2020-01-17: 10000 [IU] via SUBCUTANEOUS

## 2020-01-31 ENCOUNTER — Ambulatory Visit (HOSPITAL_COMMUNITY)
Admission: RE | Admit: 2020-01-31 | Discharge: 2020-01-31 | Disposition: A | Discharge status: Home / Self Care | Payer: Medicare Other | Source: Ambulatory Visit | Attending: Nephrology | Admitting: Nephrology

## 2020-01-31 VITALS — BP 132/51 | HR 76 | Temp 97.4°F | Resp 20

## 2020-01-31 DIAGNOSIS — D638 Anemia in other chronic diseases classified elsewhere: Secondary | ICD-10-CM

## 2020-01-31 LAB — POCT HEMOGLOBIN-HEMACUE: Hemoglobin: 11.4 g/dL — ABNORMAL LOW (ref 13.0–17.0)

## 2020-01-31 LAB — IRON AND TIBC
Iron: 80 ug/dL (ref 45–182)
Saturation Ratios: 35 % (ref 17.9–39.5)
TIBC: 225 ug/dL — ABNORMAL LOW (ref 250–450)
UIBC: 145 ug/dL

## 2020-01-31 LAB — FERRITIN: Ferritin: 475 ng/mL — ABNORMAL HIGH (ref 24–336)

## 2020-01-31 MED ORDER — EPOETIN ALFA-EPBX 10000 UNIT/ML IJ SOLN
INTRAMUSCULAR | Status: AC
  Administered 2020-01-31: 10000 [IU] via SUBCUTANEOUS
  Filled 2020-01-31: qty 1

## 2020-01-31 MED ORDER — EPOETIN ALFA-EPBX 10000 UNIT/ML IJ SOLN: 10000.0000 [IU] | INTRAMUSCULAR | Status: DC

## 2020-02-02 ENCOUNTER — Encounter (HOSPITAL_COMMUNITY): Payer: Self-pay | Admitting: Emergency Medicine

## 2020-02-02 ENCOUNTER — Other Ambulatory Visit: Payer: Self-pay

## 2020-02-02 ENCOUNTER — Inpatient Hospital Stay (HOSPITAL_COMMUNITY)
Admission: EM | Admit: 2020-02-02 | Discharge: 2020-03-05 | DRG: 094 | Disposition: E | Payer: Medicare Other | Attending: Internal Medicine | Admitting: Internal Medicine

## 2020-02-02 DIAGNOSIS — L97419 Non-pressure chronic ulcer of right heel and midfoot with unspecified severity: Secondary | ICD-10-CM | POA: Diagnosis present

## 2020-02-02 DIAGNOSIS — E119 Type 2 diabetes mellitus without complications: Secondary | ICD-10-CM

## 2020-02-02 DIAGNOSIS — G062 Extradural and subdural abscess, unspecified: Principal | ICD-10-CM | POA: Diagnosis present

## 2020-02-02 DIAGNOSIS — E1169 Type 2 diabetes mellitus with other specified complication: Secondary | ICD-10-CM | POA: Diagnosis present

## 2020-02-02 DIAGNOSIS — I129 Hypertensive chronic kidney disease with stage 1 through stage 4 chronic kidney disease, or unspecified chronic kidney disease: Secondary | ICD-10-CM | POA: Diagnosis present

## 2020-02-02 DIAGNOSIS — Z85828 Personal history of other malignant neoplasm of skin: Secondary | ICD-10-CM

## 2020-02-02 DIAGNOSIS — J90 Pleural effusion, not elsewhere classified: Secondary | ICD-10-CM | POA: Diagnosis present

## 2020-02-02 DIAGNOSIS — I824Z2 Acute embolism and thrombosis of unspecified deep veins of left distal lower extremity: Secondary | ICD-10-CM

## 2020-02-02 DIAGNOSIS — E11621 Type 2 diabetes mellitus with foot ulcer: Secondary | ICD-10-CM | POA: Diagnosis present

## 2020-02-02 DIAGNOSIS — K449 Diaphragmatic hernia without obstruction or gangrene: Secondary | ICD-10-CM | POA: Diagnosis present

## 2020-02-02 DIAGNOSIS — I080 Rheumatic disorders of both mitral and aortic valves: Secondary | ICD-10-CM | POA: Diagnosis present

## 2020-02-02 DIAGNOSIS — I10 Essential (primary) hypertension: Secondary | ICD-10-CM

## 2020-02-02 DIAGNOSIS — R32 Unspecified urinary incontinence: Secondary | ICD-10-CM | POA: Diagnosis present

## 2020-02-02 DIAGNOSIS — D631 Anemia in chronic kidney disease: Secondary | ICD-10-CM | POA: Diagnosis present

## 2020-02-02 DIAGNOSIS — D638 Anemia in other chronic diseases classified elsewhere: Secondary | ICD-10-CM | POA: Diagnosis present

## 2020-02-02 DIAGNOSIS — I82442 Acute embolism and thrombosis of left tibial vein: Secondary | ICD-10-CM | POA: Diagnosis present

## 2020-02-02 DIAGNOSIS — E1122 Type 2 diabetes mellitus with diabetic chronic kidney disease: Secondary | ICD-10-CM | POA: Diagnosis present

## 2020-02-02 DIAGNOSIS — J189 Pneumonia, unspecified organism: Secondary | ICD-10-CM | POA: Diagnosis not present

## 2020-02-02 DIAGNOSIS — Z20822 Contact with and (suspected) exposure to covid-19: Secondary | ICD-10-CM | POA: Diagnosis present

## 2020-02-02 DIAGNOSIS — L89892 Pressure ulcer of other site, stage 2: Secondary | ICD-10-CM | POA: Diagnosis present

## 2020-02-02 DIAGNOSIS — J811 Chronic pulmonary edema: Secondary | ICD-10-CM | POA: Diagnosis not present

## 2020-02-02 DIAGNOSIS — N184 Chronic kidney disease, stage 4 (severe): Secondary | ICD-10-CM | POA: Diagnosis present

## 2020-02-02 DIAGNOSIS — M4626 Osteomyelitis of vertebra, lumbar region: Secondary | ICD-10-CM

## 2020-02-02 DIAGNOSIS — Z79899 Other long term (current) drug therapy: Secondary | ICD-10-CM

## 2020-02-02 DIAGNOSIS — Z9181 History of falling: Secondary | ICD-10-CM

## 2020-02-02 DIAGNOSIS — Z66 Do not resuscitate: Secondary | ICD-10-CM | POA: Diagnosis present

## 2020-02-02 DIAGNOSIS — M4646 Discitis, unspecified, lumbar region: Secondary | ICD-10-CM

## 2020-02-02 DIAGNOSIS — L8990 Pressure ulcer of unspecified site, unspecified stage: Secondary | ICD-10-CM | POA: Insufficient documentation

## 2020-02-02 DIAGNOSIS — B9561 Methicillin susceptible Staphylococcus aureus infection as the cause of diseases classified elsewhere: Secondary | ICD-10-CM

## 2020-02-02 DIAGNOSIS — R7881 Bacteremia: Secondary | ICD-10-CM | POA: Diagnosis present

## 2020-02-02 DIAGNOSIS — F329 Major depressive disorder, single episode, unspecified: Secondary | ICD-10-CM | POA: Diagnosis present

## 2020-02-02 DIAGNOSIS — Z8711 Personal history of peptic ulcer disease: Secondary | ICD-10-CM

## 2020-02-02 DIAGNOSIS — Z7984 Long term (current) use of oral hypoglycemic drugs: Secondary | ICD-10-CM

## 2020-02-02 DIAGNOSIS — I82402 Acute embolism and thrombosis of unspecified deep veins of left lower extremity: Secondary | ICD-10-CM | POA: Diagnosis not present

## 2020-02-02 DIAGNOSIS — M549 Dorsalgia, unspecified: Secondary | ICD-10-CM

## 2020-02-02 DIAGNOSIS — R0602 Shortness of breath: Secondary | ICD-10-CM

## 2020-02-02 DIAGNOSIS — I82452 Acute embolism and thrombosis of left peroneal vein: Secondary | ICD-10-CM | POA: Diagnosis present

## 2020-02-02 LAB — COMPREHENSIVE METABOLIC PANEL
ALT: 14 U/L (ref 0–44)
AST: 13 U/L — ABNORMAL LOW (ref 15–41)
Albumin: 3.4 g/dL — ABNORMAL LOW (ref 3.5–5.0)
Alkaline Phosphatase: 102 U/L (ref 38–126)
Anion gap: 13 (ref 5–15)
BUN: 55 mg/dL — ABNORMAL HIGH (ref 8–23)
CO2: 24 mmol/L (ref 22–32)
Calcium: 9.4 mg/dL (ref 8.9–10.3)
Chloride: 104 mmol/L (ref 98–111)
Creatinine, Ser: 2.76 mg/dL — ABNORMAL HIGH (ref 0.61–1.24)
GFR calc Af Amer: 23 mL/min — ABNORMAL LOW (ref >60)
GFR calc non Af Amer: 19 mL/min — ABNORMAL LOW (ref >60)
Glucose, Bld: 211 mg/dL — ABNORMAL HIGH (ref 70–99)
Potassium: 4.3 mmol/L (ref 3.5–5.1)
Sodium: 141 mmol/L (ref 135–145)
Total Bilirubin: 0.4 mg/dL (ref 0.3–1.2)
Total Protein: 6.9 g/dL (ref 6.5–8.1)

## 2020-02-02 LAB — CBC WITH DIFFERENTIAL/PLATELET
Abs Immature Granulocytes: 0.05 10*3/uL (ref 0.00–0.07)
Basophils Absolute: 0 10*3/uL (ref 0.0–0.1)
Basophils Relative: 0 %
Eosinophils Absolute: 0 10*3/uL (ref 0.0–0.5)
Eosinophils Relative: 0 %
HCT: 39.2 % (ref 39.0–52.0)
Hemoglobin: 12 g/dL — ABNORMAL LOW (ref 13.0–17.0)
Immature Granulocytes: 0 %
Lymphocytes Relative: 2 %
Lymphs Abs: 0.2 10*3/uL — ABNORMAL LOW (ref 0.7–4.0)
MCH: 32.3 pg (ref 26.0–34.0)
MCHC: 30.6 g/dL (ref 30.0–36.0)
MCV: 105.7 fL — ABNORMAL HIGH (ref 80.0–100.0)
Monocytes Absolute: 0.8 10*3/uL (ref 0.1–1.0)
Monocytes Relative: 7 %
Neutro Abs: 10.7 10*3/uL — ABNORMAL HIGH (ref 1.7–7.7)
Neutrophils Relative %: 91 %
Platelets: 149 10*3/uL — ABNORMAL LOW (ref 150–400)
RBC: 3.71 MIL/uL — ABNORMAL LOW (ref 4.22–5.81)
RDW: 14.4 % (ref 11.5–15.5)
WBC: 11.8 10*3/uL — ABNORMAL HIGH (ref 4.0–10.5)
nRBC: 0 % (ref 0.0–0.2)

## 2020-02-02 MED ORDER — OXYCODONE-ACETAMINOPHEN 5-325 MG PO TABS
1.0000 | ORAL_TABLET | Freq: Once | ORAL | Status: AC
  Administered 2020-02-02: 1 via ORAL
  Filled 2020-02-02: qty 1

## 2020-02-02 NOTE — ED Triage Notes (Signed)
Patient arrived with PTAR reports low back pain this evening with bilateral leg weakness , denies injury , no dysuria or fever .

## 2020-02-03 ENCOUNTER — Emergency Department (HOSPITAL_BASED_OUTPATIENT_CLINIC_OR_DEPARTMENT_OTHER): Payer: Medicare Other

## 2020-02-03 ENCOUNTER — Emergency Department (HOSPITAL_COMMUNITY): Payer: Medicare Other

## 2020-02-03 DIAGNOSIS — Z9181 History of falling: Secondary | ICD-10-CM | POA: Diagnosis not present

## 2020-02-03 DIAGNOSIS — I10 Essential (primary) hypertension: Secondary | ICD-10-CM

## 2020-02-03 DIAGNOSIS — R609 Edema, unspecified: Secondary | ICD-10-CM | POA: Diagnosis not present

## 2020-02-03 DIAGNOSIS — G062 Extradural and subdural abscess, unspecified: Secondary | ICD-10-CM | POA: Diagnosis not present

## 2020-02-03 DIAGNOSIS — D631 Anemia in chronic kidney disease: Secondary | ICD-10-CM | POA: Diagnosis present

## 2020-02-03 DIAGNOSIS — I129 Hypertensive chronic kidney disease with stage 1 through stage 4 chronic kidney disease, or unspecified chronic kidney disease: Secondary | ICD-10-CM | POA: Diagnosis present

## 2020-02-03 DIAGNOSIS — N184 Chronic kidney disease, stage 4 (severe): Secondary | ICD-10-CM | POA: Diagnosis present

## 2020-02-03 DIAGNOSIS — Z8711 Personal history of peptic ulcer disease: Secondary | ICD-10-CM | POA: Diagnosis not present

## 2020-02-03 DIAGNOSIS — L89892 Pressure ulcer of other site, stage 2: Secondary | ICD-10-CM | POA: Diagnosis present

## 2020-02-03 DIAGNOSIS — E119 Type 2 diabetes mellitus without complications: Secondary | ICD-10-CM | POA: Diagnosis not present

## 2020-02-03 DIAGNOSIS — E1169 Type 2 diabetes mellitus with other specified complication: Secondary | ICD-10-CM | POA: Diagnosis present

## 2020-02-03 DIAGNOSIS — B9561 Methicillin susceptible Staphylococcus aureus infection as the cause of diseases classified elsewhere: Secondary | ICD-10-CM | POA: Diagnosis not present

## 2020-02-03 DIAGNOSIS — D638 Anemia in other chronic diseases classified elsewhere: Secondary | ICD-10-CM | POA: Diagnosis not present

## 2020-02-03 DIAGNOSIS — I82452 Acute embolism and thrombosis of left peroneal vein: Secondary | ICD-10-CM | POA: Diagnosis not present

## 2020-02-03 DIAGNOSIS — Z66 Do not resuscitate: Secondary | ICD-10-CM | POA: Diagnosis not present

## 2020-02-03 DIAGNOSIS — R110 Nausea: Secondary | ICD-10-CM | POA: Diagnosis not present

## 2020-02-03 DIAGNOSIS — L97419 Non-pressure chronic ulcer of right heel and midfoot with unspecified severity: Secondary | ICD-10-CM | POA: Diagnosis not present

## 2020-02-03 DIAGNOSIS — I080 Rheumatic disorders of both mitral and aortic valves: Secondary | ICD-10-CM | POA: Diagnosis present

## 2020-02-03 DIAGNOSIS — M4626 Osteomyelitis of vertebra, lumbar region: Secondary | ICD-10-CM | POA: Diagnosis not present

## 2020-02-03 DIAGNOSIS — I824Z2 Acute embolism and thrombosis of unspecified deep veins of left distal lower extremity: Secondary | ICD-10-CM | POA: Diagnosis not present

## 2020-02-03 DIAGNOSIS — J90 Pleural effusion, not elsewhere classified: Secondary | ICD-10-CM | POA: Diagnosis present

## 2020-02-03 DIAGNOSIS — I350 Nonrheumatic aortic (valve) stenosis: Secondary | ICD-10-CM | POA: Diagnosis not present

## 2020-02-03 DIAGNOSIS — J189 Pneumonia, unspecified organism: Secondary | ICD-10-CM | POA: Diagnosis not present

## 2020-02-03 DIAGNOSIS — R5381 Other malaise: Secondary | ICD-10-CM | POA: Diagnosis not present

## 2020-02-03 DIAGNOSIS — M549 Dorsalgia, unspecified: Secondary | ICD-10-CM | POA: Diagnosis not present

## 2020-02-03 DIAGNOSIS — K449 Diaphragmatic hernia without obstruction or gangrene: Secondary | ICD-10-CM | POA: Diagnosis not present

## 2020-02-03 DIAGNOSIS — E1122 Type 2 diabetes mellitus with diabetic chronic kidney disease: Secondary | ICD-10-CM | POA: Diagnosis present

## 2020-02-03 DIAGNOSIS — J811 Chronic pulmonary edema: Secondary | ICD-10-CM | POA: Diagnosis not present

## 2020-02-03 DIAGNOSIS — M4646 Discitis, unspecified, lumbar region: Secondary | ICD-10-CM | POA: Diagnosis not present

## 2020-02-03 DIAGNOSIS — M869 Osteomyelitis, unspecified: Secondary | ICD-10-CM | POA: Diagnosis not present

## 2020-02-03 DIAGNOSIS — I340 Nonrheumatic mitral (valve) insufficiency: Secondary | ICD-10-CM | POA: Diagnosis not present

## 2020-02-03 DIAGNOSIS — R918 Other nonspecific abnormal finding of lung field: Secondary | ICD-10-CM | POA: Diagnosis not present

## 2020-02-03 DIAGNOSIS — Z20822 Contact with and (suspected) exposure to covid-19: Secondary | ICD-10-CM | POA: Diagnosis present

## 2020-02-03 DIAGNOSIS — F329 Major depressive disorder, single episode, unspecified: Secondary | ICD-10-CM | POA: Diagnosis present

## 2020-02-03 DIAGNOSIS — R7881 Bacteremia: Secondary | ICD-10-CM | POA: Diagnosis present

## 2020-02-03 DIAGNOSIS — I82402 Acute embolism and thrombosis of unspecified deep veins of left lower extremity: Secondary | ICD-10-CM | POA: Diagnosis not present

## 2020-02-03 DIAGNOSIS — I82442 Acute embolism and thrombosis of left tibial vein: Secondary | ICD-10-CM | POA: Diagnosis present

## 2020-02-03 DIAGNOSIS — M545 Low back pain: Secondary | ICD-10-CM | POA: Diagnosis not present

## 2020-02-03 LAB — URINALYSIS, ROUTINE W REFLEX MICROSCOPIC
Bacteria, UA: NONE SEEN
Bilirubin Urine: NEGATIVE
Glucose, UA: NEGATIVE mg/dL
Ketones, ur: NEGATIVE mg/dL
Nitrite: NEGATIVE
Protein, ur: 100 mg/dL — AB
Specific Gravity, Urine: 1.012 (ref 1.005–1.030)
pH: 5 (ref 5.0–8.0)

## 2020-02-03 LAB — CBG MONITORING, ED
Glucose-Capillary: 172 mg/dL — ABNORMAL HIGH (ref 70–99)
Glucose-Capillary: 147 mg/dL — ABNORMAL HIGH (ref 70–99)
Glucose-Capillary: 141 mg/dL — ABNORMAL HIGH (ref 70–99)

## 2020-02-03 LAB — SARS CORONAVIRUS 2 BY RT PCR (HOSPITAL ORDER, PERFORMED IN ~~LOC~~ HOSPITAL LAB): SARS Coronavirus 2: NEGATIVE

## 2020-02-03 MED ORDER — BISACODYL 5 MG PO TBEC
5.0000 mg | DELAYED_RELEASE_TABLET | Freq: Every day | ORAL | Status: DC | PRN
  Administered 2020-02-08: 5 mg via ORAL
  Filled 2020-02-03: qty 1

## 2020-02-03 MED ORDER — OXYCODONE-ACETAMINOPHEN 5-325 MG PO TABS
1.0000 | ORAL_TABLET | Freq: Once | ORAL | Status: AC
  Administered 2020-02-03: 1 via ORAL
  Filled 2020-02-03: qty 1

## 2020-02-03 MED ORDER — OXYCODONE HCL 5 MG PO TABS: 5.0000 mg | ORAL_TABLET | ORAL | Status: DC | PRN

## 2020-02-03 MED ORDER — HEPARIN (PORCINE) 25000 UT/250ML-% IV SOLN
1550.0000 [IU]/h | INTRAVENOUS | Status: DC
  Administered 2020-02-03 – 2020-02-04 (×3): 1500 [IU]/h via INTRAVENOUS
  Administered 2020-02-05: 1550 [IU]/h via INTRAVENOUS
  Administered 2020-02-06: 1700 [IU]/h via INTRAVENOUS
  Administered 2020-02-06: 1550 [IU]/h via INTRAVENOUS
  Administered 2020-02-08 (×2): 1700 [IU]/h via INTRAVENOUS
  Filled 2020-02-03 (×10): qty 250

## 2020-02-03 MED ORDER — HYDROMORPHONE HCL 1 MG/ML IJ SOLN: 0.5000 mg | INTRAMUSCULAR | Status: DC | PRN

## 2020-02-03 MED ORDER — PRAVASTATIN SODIUM 40 MG PO TABS
40.0000 mg | ORAL_TABLET | Freq: Every day | ORAL | Status: DC
  Administered 2020-02-04 – 2020-02-08 (×4): 40 mg via ORAL
  Filled 2020-02-03 (×4): qty 1

## 2020-02-03 MED ORDER — INSULIN ASPART 100 UNIT/ML ~~LOC~~ SOLN
0.0000 [IU] | Freq: Three times a day (TID) | SUBCUTANEOUS | Status: DC
  Administered 2020-02-04: 1 [IU] via SUBCUTANEOUS

## 2020-02-03 MED ORDER — POLYETHYLENE GLYCOL 3350 17 G PO PACK: 17.0000 g | PACK | Freq: Every day | ORAL | Status: DC | PRN

## 2020-02-03 MED ORDER — FUROSEMIDE 40 MG PO TABS
40.0000 mg | ORAL_TABLET | Freq: Two times a day (BID) | ORAL | Status: DC
  Administered 2020-02-03 – 2020-02-04 (×2): 40 mg via ORAL
  Filled 2020-02-03: qty 2
  Filled 2020-02-03: qty 1

## 2020-02-03 MED ORDER — BOOST PO LIQD
237.0000 mL | Freq: Every day | ORAL | Status: DC
  Administered 2020-02-03 – 2020-02-08 (×5): 237 mL via ORAL
  Filled 2020-02-03 (×7): qty 237

## 2020-02-03 MED ORDER — HEPARIN BOLUS VIA INFUSION
5500.0000 [IU] | Freq: Once | INTRAVENOUS | Status: AC
  Administered 2020-02-03: 5500 [IU] via INTRAVENOUS
  Filled 2020-02-03: qty 5500

## 2020-02-03 MED ORDER — SODIUM CHLORIDE 0.9 % IV SOLN: INTRAVENOUS | Status: DC

## 2020-02-03 MED ORDER — ACETAMINOPHEN 325 MG PO TABS
650.0000 mg | ORAL_TABLET | Freq: Four times a day (QID) | ORAL | Status: DC | PRN
  Administered 2020-02-04 – 2020-02-05 (×3): 650 mg via ORAL
  Filled 2020-02-03 (×3): qty 2

## 2020-02-03 MED ORDER — DILTIAZEM HCL ER COATED BEADS 240 MG PO CP24
240.0000 mg | ORAL_CAPSULE | Freq: Every day | ORAL | Status: DC
  Administered 2020-02-03 – 2020-02-08 (×5): 240 mg via ORAL
  Filled 2020-02-03 (×6): qty 1

## 2020-02-03 MED ORDER — ACETAMINOPHEN 650 MG RE SUPP: 650.0000 mg | Freq: Four times a day (QID) | RECTAL | Status: DC | PRN

## 2020-02-03 NOTE — H&P (Signed)
History and Physical:    [name]   DJM:[id number] DOB: 04/29/1928 DOA: 01/27/2020  Referring MD/provider: PA Rodell Perna PCP: Haywood Pao, MD   Patient coming from: Home  Chief Complaint: Sudden onset back pain with sudden onset weakness of bilateral lower extremities at 2:00 yesterday  History of Present Illness:   [name] is an 86 y.o. male with HTN, DM 2 who was in his usual state of health until 2:00 PM yesterday when he noted sudden onset of back pain associated with lower extremity weakness.  Patient had felt tired earlier that morning when he went to have coffee with his [employer] friends but he had attributed that to "low iron levels".  Patient states he gets IV iron at his nephrologist office twice a month.  Patient was able to ambulate with his walker but went home to rest rather than stay out as usual.  He fixed himself lunch without difficulty but when he sat down after lunch, he was suddenly unable to get up at all due to lower extremity weakness and severe back pain every time he tried to get up.  Patient called his son and EMS was called.  Patient states he lost his wife of next 1 years in February 2021.  He states that since that time he has "been going downhill".  He notes that he is lost 10 pounds since then.  Also states that he is becoming increasingly weak and often uses a walker or a cane to walk.  As noted above, patient has attributed his progressive weakness to loss of his wife and low iron levels.  Patient states he is never had such severe back pain or complete inability to stand up as he did yesterday prior to that.  Patient does not think he has had fevers or chills.  He has not had any abdominal pain, nausea or vomiting.  He has not really had any malaise.  He does admit to depressed mood since his wife died.  No headaches.  No syncope or presyncope.  He does note that he has had swelling in his left foot for the past week but is  not sure why that is.  Patient specifically denies any dyspnea on exertion.  No hemoptysis.  ED Course:  The patient noted to have isolated swelling in his left foot.  He underwent Doppler study which did show an acute DVT.  Patient underwent an MRI L-spine which found osteomyelitis/discitis at the L2-3 level with an epidural abscess measuring 9.2 cm extending down into the bilateral psoas muscles.  Patient was seen by neurosurgery who are recommending aspiration via IR, blood cultures and holding off on antibiotics until we have culture results.  They are requesting tried hospitalist to admit.  ROS:   ROS   Review of Systems: As per HPI  Past Medical History:   Past Medical History:  Diagnosis Date  . Anemia   . Callus   . Diabetes mellitus without complication ([place])   . Gastrointestinal ulcer   . Hypertension     Past Surgical History:   History reviewed. No pertinent surgical history.  Social History:   Social History   Socioeconomic History  . Marital status: Married    Spouse name: Not on file  . Number of children: Not on file  . Years of education: Not on file  . Highest education level: Not on file  Occupational History  . Not on file  Tobacco Use  .  Smoking status: Never Smoker  . Smokeless tobacco: Never Used  Substance and Sexual Activity  . Alcohol use: No  . Drug use: No  . Sexual activity: Not on file  Other Topics Concern  . Not on file  Social History Narrative  . Not on file   Social Determinants of Health   Financial Resource Strain:   . Difficulty of Paying Living Expenses:   Food Insecurity:   . Worried About Charity fundraiser in the Last Year:   . Arboriculturist in the Last Year:   Transportation Needs:   . Film/video editor (Medical):   Marland Kitchen Lack of Transportation (Non-Medical):   Physical Activity:   . Days of Exercise per Week:   . Minutes of Exercise per Session:   Stress:   . Feeling of Stress :   Social Connections:   .  Frequency of Communication with Friends and Family:   . Frequency of Social Gatherings with Friends and Family:   . Attends Religious Services:   . Active Member of Clubs or Organizations:   . Attends Archivist Meetings:   Marland Kitchen Marital Status:   Intimate Partner Violence:   . Fear of Current or Ex-Partner:   . Emotionally Abused:   Marland Kitchen Physically Abused:   . Sexually Abused:     Allergies   Patient has no known allergies.  Family history:   No family history on file.  Current Medications:   Prior to Admission medications   Medication Sig Start Date End Date Taking? Authorizing Provider  calcitRIOL (ROCALTROL) 0.25 MCG capsule  10/17/18   [provider]  cephALEXin (KEFLEX) 500 MG capsule Take 1 capsule (500 mg total) by mouth 2 (two) times daily. 01/10/19   Gardiner Barefoot, DPM  diltiazem (CARDIZEM CD) 240 MG 24 hr capsule Take 240 mg by mouth daily. 10/11/19   [provider]  DILTIAZEM HCL PO  10/17/18   [provider]  famotidine (PEPCID) 40 MG tablet  11/27/18   [provider]  FLUAD 0.5 ML SUSY PHARMACIST ADMINISTERED IMMUNIZATION ADMINISTERED AT TIME OF DISPENSING 04/05/18   [provider]  furosemide (LASIX) 40 MG tablet Take 40 mg by mouth 2 (two) times daily. 10/11/19   [provider]  JANUVIA 50 MG tablet Take 50 mg by mouth daily. 12/14/19   [provider]  metFORMIN (GLUCOPHAGE) 1000 MG tablet Take 1,000 mg by mouth 1 day or 1 dose.    [provider]  mupirocin ointment (BACTROBAN) 2 % APPLY 1 APPLICATION ON THE SKIN DAILY 04/19/18   [provider]  pantoprazole (PROTONIX) 40 MG tablet Take 40 mg by mouth daily.    [provider]  pravastatin (PRAVACHOL) 40 MG tablet  10/17/18   [provider]  ramipril (ALTACE) 5 MG capsule  05/22/13   [provider]  silver sulfADIAZINE (SILVADENE) 1 % cream Apply 1 application topically daily. 07/27/16   Gean Birchwood, DPM  Sodium Polystyrene Sulfonate Layla Barter) POWD  05/25/13   [provider]  sulfamethoxazole-trimethoprim (BACTRIM DS) 800-160 MG tablet  12/25/18   [provider]  terazosin (HYTRIN) 10 MG capsule Take 10 mg by mouth at bedtime.    [provider]  triamcinolone cream (KENALOG) 0.1 %  12/26/18   [provider]    Physical Exam:   Vitals:   02/03/20 1245 02/03/20 1300 02/03/20 1426 02/03/20 1430  BP: (!) 149/63 (!) 149/63  (!) 127/61  Pulse: 93 95  105  Resp: 12 16  16   Temp:      TempSrc:      SpO2: 95% 95%  96%  Weight:   88.5 kg   Height:   6\' 1"  (1.854 m)      Physical Exam: Blood pressure (!) 127/61, pulse 105, temperature 99 F (37.2 C), temperature source Oral, resp. rate 16, height 6\' 1"  (1.854 m), weight 88.5 kg, SpO2 96 %. Gen: Thin man looking stated age lying flat in bed in no acute distress Eyes: sclera anicteric, conjuctiva mildly injected bilaterally CVS: S1-S2, regulary, no gallops Respiratory:  decreased air entry likely secondary to decreased inspiratory effort GI: NABS, soft, NT  LE: Patient has some edema and erythema of his left lower foot.  He also has circumferential hyperpigmentation of his right foot consistent with venous stasis disease. Neuro: Patient has 2+/5 strength in his hip flexors, states that it is very painful and is back to flex at hips. Psych: patient is logical and coherent, judgement and insight appear normal, mood and affect appropriate to situation. Skin: no rashes or lesions or ulcers,    Data Review:    Labs: Basic Metabolic Panel: Recent Labs  Lab 01/06/2020 2151  NA 141  K 4.3  CL 104  CO2 24  GLUCOSE 211*  BUN 55*  CREATININE 2.76*  CALCIUM 9.4   Liver Function Tests: Recent Labs  Lab 02/01/2020 2151  AST 13*  ALT 14  ALKPHOS 102  BILITOT 0.4  PROT 6.9  ALBUMIN 3.4*   No results for input(s): LIPASE, AMYLASE in the last 168 hours. No results for input(s): AMMONIA in the  last 168 hours. CBC: Recent Labs  Lab 01/31/20 0832 01/04/2020 2151  WBC  --  11.8*  NEUTROABS  --  10.7*  HGB 11.4* 12.0*  HCT  --  39.2  MCV  --  105.7*  PLT  --  149*   Cardiac Enzymes: No results for input(s): CKTOTAL, CKMB, CKMBINDEX, TROPONINI in the last 168 hours.  BNP (last 3 results) No results for input(s): PROBNP in the last 8760 hours. CBG: Recent Labs  Lab 02/03/20 0545 02/03/20 0828  GLUCAP 172* 147*    Urinalysis    Component Value Date/Time   COLORURINE YELLOW 02/03/2020 1207   APPEARANCEUR HAZY (A) 02/03/2020 1207   LABSPEC 1.012 02/03/2020 1207   PHURINE 5.0 02/03/2020 1207   GLUCOSEU NEGATIVE 02/03/2020 1207   HGBUR SMALL (A) 02/03/2020 1207   [place] 02/03/2020 1207   [place] 02/03/2020 1207   PROTEINUR 100 (A) 02/03/2020 1207   NITRITE NEGATIVE 02/03/2020 1207   LEUKOCYTESUR MODERATE (A) 02/03/2020 1207      Radiographic Studies: DG Lumbar Spine Complete  Result Date: 02/03/2020 CLINICAL DATA:  Low back pain beginning yesterday evening with bilateral leg weakness. No injury. EXAM: LUMBAR SPINE - COMPLETE 4+ VIEW COMPARISON:  Abdomen and pelvis CT, 02/26/2010. FINDINGS: No fracture, bone lesion or spondylolisthesis. Mild loss of disc height at L3-L4, L4-L5 and L5-S1. Significant endplate sclerosis at O3-Z8, to a lesser degree at L2-L3. There are large anterior osteophytes at L3-L4 and L2-L3. Facet joints are relatively well preserved. Skeletal structures are diffusely demineralized. Calcifications along a normal caliber abdominal aorta. IMPRESSION: 1. No fracture or acute finding. 2. Degenerative changes as detailed which progressed compared to the prior CT. Electronically Signed   By: Lajean Manes M.D.   On: 02/03/2020 10:23   MR LUMBAR SPINE WO CONTRAST  Result Date: 02/03/2020  CLINICAL DATA:  Low back pain.  Leukocytosis EXAM: MRI LUMBAR SPINE WITHOUT CONTRAST TECHNIQUE: Multiplanar, multisequence MR imaging of the lumbar  spine was performed. No intravenous contrast was administered. COMPARISON:  X-ray 02/03/2020.  CT 10/27/2009 FINDINGS: Segmentation:  Standard. Alignment:  Physiologic. Vertebrae: Extensive fluid signal within the L3-4 disc space with lobulated fluid collection extending into the paravertebral soft tissues measuring approximately 9.2 x 6.0 cm trans axially (series 9, image 19). Components of the fluid collection extend into the bilateral psoas muscles, left greater than right (series 9, image 21). There is a small amount of fluid signal within the anterior aspect of the L2-3 disc space which is contiguous with the previously described fluid collection anterior to L3-4 (series 6, images 10-12). There are endplate irregularities within the L3 and L4 vertebral bodies centered at the L3-4 disc space with extensive bone marrow edema. There are also mild endplate irregularity at the anterior aspect of the L2-3 disc space with associated marrow edema. Conus medullaris and cauda equina: Conus extends to the L1 level. Conus and cauda equina appear normal. No epidural fluid collection is identified. Paraspinal and other soft tissues: Prevertebral fluid collection, as described above. Intramuscular edema within the bilateral psoas muscles. Posterior paraspinal muscle atrophy. Cholelithiasis. Disc levels: T12-L1: Sagittal sequences only.  Unremarkable. L1-L2: No disc protrusion. Bony overgrowth in the region of the left foramen results in mild foraminal narrowing without evidence of neural impingement. No canal stenosis. L2-L3: No disc protrusion. Mild bilateral facet hypertrophy and ligamentum flavum buckling. No foraminal or canal stenosis. L3-L4: Disc height loss with small posterior disc osteophyte complex. Bilateral facet arthropathy and ligamentum flavum hypertrophy. Findings result in moderate to severe left and mild right foraminal stenosis. Mild bilateral subarticular recess stenosis without canal stenosis. L4-L5:  Shallow central disc protrusion. Mild bilateral facet hypertrophy. Extraforaminal osteophytic ridging is seen, left greater than right. No foraminal or canal stenosis. L5-S1: Right paracentral disc osteophyte complex with bilateral facet hypertrophy. Findings result in moderate right subarticular recess stenosis with impingement of the descending right S1 nerve root (series 9, image 33). There is mild-to-moderate bilateral foraminal stenosis. No canal stenosis. IMPRESSION: 1. Findings of discitis-osteomyelitis centered at the L3-4 level. There is also osteomyelitis-discitis at the anterior aspect of the L2-3 level. No evidence of epidural fluid collection or abscess. 2. Complex prevertebral fluid collection centered anterior to the L3-4 level measuring up to 9.2 cm. Fluid collection extends into the bilateral psoas muscles, both of which are edematous. 3. Multilevel degenerative changes of the lumbar spine, as above. Moderate right subarticular recess stenosis at L5-S1 with impingement of the descending right S1 nerve root. 4. Moderate-to-severe left and mild right foraminal stenosis at L3-L4. 5. Cholelithiasis. These results were called by telephone at the time of interpretation on 02/03/2020 at 2:37 pm to provider [facility] , who verbally acknowledged these results. Electronically Signed   By: Davina Poke D.O.   On: 02/03/2020 14:39   VAS [place] LOWER EXTREMITY VENOUS (DVT) (MC and [facility] 7a-7p)  Result Date: 02/03/2020  Lower Venous DVTStudy Indications: Edema.  Comparison Study: No prior study on file Performing Technologist: Sharion Dove RVS  Examination Guidelines: A complete evaluation includes B-mode imaging, spectral Doppler, color Doppler, and power Doppler as needed of all accessible portions of each vessel. Bilateral testing is considered an integral part of a complete examination. Limited examinations for reoccurring indications may be performed as noted. The reflux portion of the exam is performed  with the patient in reverse Trendelenburg.  +-----+---------------+---------+-----------+----------+--------------+  RIGHTCompressibilityPhasicitySpontaneityPropertiesThrombus Aging +-----+---------------+---------+-----------+----------+--------------+ CFV  Full           Yes      Yes                                 +-----+---------------+---------+-----------+----------+--------------+   +---------+---------------+---------+-----------+----------+--------------+ LEFT     CompressibilityPhasicitySpontaneityPropertiesThrombus Aging +---------+---------------+---------+-----------+----------+--------------+ CFV      Full           Yes      Yes                                 +---------+---------------+---------+-----------+----------+--------------+ SFJ      Full                                                        +---------+---------------+---------+-----------+----------+--------------+ FV Prox  Full                                                        +---------+---------------+---------+-----------+----------+--------------+ FV Mid   Full                                                        +---------+---------------+---------+-----------+----------+--------------+ FV DistalFull                                                        +---------+---------------+---------+-----------+----------+--------------+ PFV      Full                                                        +---------+---------------+---------+-----------+----------+--------------+ POP      Full           Yes      Yes                                 +---------+---------------+---------+-----------+----------+--------------+ PTV      Partial                                      Acute          +---------+---------------+---------+-----------+----------+--------------+ PERO     Partial                                      Acute           +---------+---------------+---------+-----------+----------+--------------+  Summary: RIGHT: - No evidence of common femoral vein obstruction.  LEFT: - Findings consistent with acute deep vein thrombosis involving the left posterior tibial veins, and left peroneal veins.  *See table(s) above for measurements and observations. Electronically signed by Deitra Mayo MD on 02/03/2020 at 4:02:00 PM.    Final     EKG:  Ordered and pending   Assessment/Plan:   Principal Problem:   Epidural abscess Active Problems:   Diabetes ([place])   Anemia of chronic disease   Hypertension   Epidural abscess 9 cm epidural abscess with extension into bilateral psoas Per ED discussion, patient has been seen by neurosurgery, note still pending but per their report, plan is for IR drainage with culture prior to starting antibiotics. Await note from neurosurgery Antibiotics have not been started per ED discussion with neurosurgery Cultures have been ordered and sent.  Acute DVT Patient started on IV heparin in ED  CKD Patient has known chronic kidney disease with GFR of 20, creatinine is at baseline.  HTN Continue diltiazem and Lasix per home doses  DM 2 Hold Januvia CBG with very low SSI coverage given CKD. Will be important to have good blood sugar control to eradicate infection however he is also at high risk for hypoglycemia.   Patient is DNR Discussed with patient, he is clear that he would not like to be resuscitated should his heart stop.  Notes "I am 86 years old of had a great life.  My wife died this way, she died with me holding her hand."    Other information:   DVT prophylaxis: Full dose heparin ordered. Code Status: DNR Family Communication: Discussed with patient's son at length who was at bedside Disposition Plan: TBD Consults called: Neurosurgery Admission status: Inpatient  [facility]  If 7PM-7AM, please contact  night-coverage [email or web address] Password [facility] 02/03/2020, 4:03 PM

## 2020-02-03 NOTE — Progress Notes (Signed)
ANTICOAGULATION CONSULT NOTE - Initial Consult  Pharmacy Consult for Heparin Indication: DVT  No Known Allergies  Patient Measurements: Height: 6\' 1"  (185.4 cm) Weight: 88.5 kg (195 lb) IBW/kg (Calculated) : 79.9 Heparin Dosing Weight: 88.5  Vital Signs: Temp: 99 F (37.2 C) (08/01 0821) Temp Source: Oral (08/01 0821) BP: 149/63 (08/01 1300) Pulse Rate: 95 (08/01 1300)  Labs: Recent Labs    01/12/2020 2151  HGB 12.0*  HCT 39.2  PLT 149*  CREATININE 2.76*    Estimated Creatinine Clearance: 20.5 mL/min (A) (by C-G formula based on SCr of 2.76 mg/dL (H)).   Medical History: Past Medical History:  Diagnosis Date  . Anemia   . Callus   . Diabetes mellitus without complication ([place])   . Gastrointestinal ulcer   . Hypertension     Assessment: 76 YOM presenting with low back pain and bilateral leg weakness with PMH of chronic anemia and CKD. Patient is not on anticoagulation PTA.   Goal of Therapy:  Heparin level 0.3-0.7 units/ml Monitor platelets by anticoagulation protocol: Yes   Plan:  Give 5500 units bolus x 1 Start heparin infusion at 1500 units/hr Monitor heparin level in ~8h and daily Continue to monitor H&H and platelets    Romilda Garret, PharmD PGY1 Acute Care Pharmacy Resident Phone: [phone] 02/03/2020 2:33 PM  Please check [email or web address] for unit specific pharmacy phone numbers.

## 2020-02-03 NOTE — Progress Notes (Signed)
VASCULAR LAB    Left lower extremity venous duplex completed.    Preliminary report:  See CV proc for preliminary results.  Gave Dr. Sherry Ruffing results.  Raelin Pixler, RVT 02/03/2020, 12:44 PM

## 2020-02-03 NOTE — ED Notes (Signed)
Pt sleeping. 

## 2020-02-03 NOTE — ED Provider Notes (Addendum)
[facility] EMERGENCY DEPARTMENT Provider Note   CSN: [id number] Arrival date & time: 01/06/2020  2101     History Chief Complaint  Patient presents with  . Back Pain    Edward PATRYCK Cardenas is a 86 y.o. male with history of anemia, diabetes mellitus, hypertension, CKD presents for evaluation of acute onset, persistent back pain with bilateral lower extremity weakness.  Reports that symptoms began at around 2 PM yesterday while he was sitting on his couch watching television.  He notes development of pain to the low back while sitting on the couch and then noted that he was having significant difficulty getting up off of the couch.  He was able to pull himself up to a standing position and walk to his kitchen to make lunch.  He then sat back down on his couch and later noticed that he was unable to get up out of the couch at that point.  He then called his son who was unable to pull him up and called for EMS.  He endorses sharp back pains that occur with flexion of the lumbar spine and movement as well as with flexion at the hips.  Denies numbness or tingling of the extremities.  He is incontinent of urine at baseline and notes no new urinary symptoms.  He denies fevers, saddle anesthesia, history of IV drug use.  He does have a history of skin cancer.  He received Percocet while in the waiting room with improvement in his pain temporarily.  He does note swelling and erythema involving the left foot that has been present for at least 1 week.  Reports that it has improved with application of a topical antibiotic cream.  Denies any injury to the foot.  The history is provided by the patient and a relative.       Past Medical History:  Diagnosis Date  . Anemia   . Callus   . Diabetes mellitus without complication ([place])   . Gastrointestinal ulcer   . Hypertension     Patient Active Problem List   Diagnosis Date Noted  . Pain due to onychomycosis of toenails of both feet  01/10/2019  . Blister of foot with infection 01/10/2019  . DM foot ulcer ([place]) 01/10/2019  . Anemia of chronic disease 06/21/2018  . Diabetes ([place]) 03/29/2013  . Gastrointestinal ulcer     History reviewed. No pertinent surgical history.     No family history on file.  Social History   Tobacco Use  . Smoking status: Never Smoker  . Smokeless tobacco: Never Used  Substance Use Topics  . Alcohol use: No  . Drug use: No    Home Medications Prior to Admission medications   Medication Sig Start Date End Date Taking? Authorizing Provider  calcitRIOL (ROCALTROL) 0.25 MCG capsule  10/17/18   [provider]  cephALEXin (KEFLEX) 500 MG capsule Take 1 capsule (500 mg total) by mouth 2 (two) times daily. 01/10/19   Gardiner Barefoot, DPM  diltiazem (CARDIZEM CD) 240 MG 24 hr capsule Take 240 mg by mouth daily. 10/11/19   [provider]  DILTIAZEM HCL PO  10/17/18   [provider]  famotidine (PEPCID) 40 MG tablet  11/27/18   [provider]  FLUAD 0.5 ML SUSY PHARMACIST ADMINISTERED IMMUNIZATION ADMINISTERED AT TIME OF DISPENSING 04/05/18   [provider]  furosemide (LASIX) 40 MG tablet Take 40 mg by mouth 2 (two) times daily. 10/11/19   [provider]  Celesta Gentile  50 MG tablet Take 50 mg by mouth daily. 12/14/19   [provider]  metFORMIN (GLUCOPHAGE) 1000 MG tablet Take 1,000 mg by mouth 1 day or 1 dose.    [provider]  mupirocin ointment (BACTROBAN) 2 % APPLY 1 APPLICATION ON THE SKIN DAILY 04/19/18   [provider]  pantoprazole (PROTONIX) 40 MG tablet Take 40 mg by mouth daily.    [provider]  pravastatin (PRAVACHOL) 40 MG tablet  10/17/18   [provider]  ramipril (ALTACE) 5 MG capsule  05/22/13   [provider]  silver sulfADIAZINE (SILVADENE) 1 % cream Apply 1 application topically daily. 07/27/16   Gean Birchwood, DPM  Sodium Polystyrene Sulfonate Layla Barter) POWD   05/25/13   [provider]  sulfamethoxazole-trimethoprim (BACTRIM DS) 800-160 MG tablet  12/25/18   [provider]  terazosin (HYTRIN) 10 MG capsule Take 10 mg by mouth at bedtime.    [provider]  triamcinolone cream (KENALOG) 0.1 %  12/26/18   [provider]    Allergies    Patient has no known allergies.  Review of Systems   Review of Systems  Constitutional: Negative for chills and fever.  Respiratory: Negative for shortness of breath.   Cardiovascular: Negative for chest pain.  Gastrointestinal: Negative for abdominal pain, nausea and vomiting.  Genitourinary: Negative for dysuria and hematuria.  Musculoskeletal: Positive for back pain and gait problem.  Neurological: Positive for weakness. Negative for numbness.  All other systems reviewed and are negative.   Physical Exam Updated Vital Signs BP (!) 127/61   Pulse 105   Temp 99 F (37.2 C) (Oral)   Resp 16   Ht 6\' 1"  (1.854 m)   Wt 88.5 kg   SpO2 96%   BMI 25.73 kg/m   Physical Exam Vitals and nursing note reviewed.  Constitutional:      General: He is not in acute distress.    Appearance: He is well-developed.  HENT:     Head: Normocephalic and atraumatic.  Eyes:     General:        Right eye: No discharge.        Left eye: No discharge.     Conjunctiva/sclera: Conjunctivae normal.  Neck:     Vascular: No JVD.     Trachea: No tracheal deviation.  Cardiovascular:     Rate and Rhythm: Normal rate.     Pulses: Normal pulses.     Comments: 2+ DP/PT pulses bilaterally.  See below images.  Patient with chronic skin changes involving the right lower leg as well as an ulceration to the plantar aspect of the right foot which patient and family note is chronic and unchanged.  Left lower extremity with erythema and swelling noted to the dorsum of the foot with 2+ pitting edema up to the lower leg.  Bevelyn Buckles' sign absent bilaterally.  No crepitus or tenderness noted on palpation  of the foot. Pulmonary:     Effort: Pulmonary effort is normal.  Abdominal:     General: There is no distension.  Musculoskeletal:     Comments: No midline spine tenderness or paralumbar muscle tenderness.  3/5 strength of bilateral hip flexors, 5/5 strength of bilateral lower extremities otherwise.  Skin:    General: Skin is warm.     Findings: No erythema.  Neurological:     Mental Status: He is alert.     Comments: Sensation intact to light touch of bilateral lower extremities.  Psychiatric:  Behavior: Behavior normal.        ED Results / Procedures / Treatments   Labs (all labs ordered are listed, but only abnormal results are displayed) Labs Reviewed  CBC WITH DIFFERENTIAL/PLATELET - Abnormal; Notable for the following components:      Result Value   WBC 11.8 (*)    RBC 3.71 (*)    Hemoglobin 12.0 (*)    MCV 105.7 (*)    Platelets 149 (*)    Neutro Abs 10.7 (*)    Lymphs Abs 0.2 (*)    All other components within normal limits  COMPREHENSIVE METABOLIC PANEL - Abnormal; Notable for the following components:   Glucose, Bld 211 (*)    BUN 55 (*)    Creatinine, Ser 2.76 (*)    Albumin 3.4 (*)    AST 13 (*)    GFR calc non Af Amer 19 (*)    GFR calc Af Amer 23 (*)    All other components within normal limits  URINALYSIS, ROUTINE W REFLEX MICROSCOPIC - Abnormal; Notable for the following components:   APPearance HAZY (*)    Hgb urine dipstick SMALL (*)    Protein, ur 100 (*)    Leukocytes,Ua MODERATE (*)    Non Squamous Epithelial 0-5 (*)    All other components within normal limits  CBG MONITORING, ED - Abnormal; Notable for the following components:   Glucose-Capillary 172 (*)    All other components within normal limits  CBG MONITORING, ED - Abnormal; Notable for the following components:   Glucose-Capillary 147 (*)    All other components within normal limits  SARS CORONAVIRUS 2 BY RT PCR (HOSPITAL ORDER, [facility])    CULTURE, BLOOD (ROUTINE X 2)  CULTURE, BLOOD (ROUTINE X 2)  HEPARIN LEVEL (UNFRACTIONATED)    EKG None  Radiology DG Lumbar Spine Complete  Result Date: 02/03/2020 CLINICAL DATA:  Low back pain beginning yesterday evening with bilateral leg weakness. No injury. EXAM: LUMBAR SPINE - COMPLETE 4+ VIEW COMPARISON:  Abdomen and pelvis CT, 02/26/2010. FINDINGS: No fracture, bone lesion or spondylolisthesis. Mild loss of disc height at L3-L4, L4-L5 and L5-S1. Significant endplate sclerosis at Y1-O1, to a lesser degree at L2-L3. There are large anterior osteophytes at L3-L4 and L2-L3. Facet joints are relatively well preserved. Skeletal structures are diffusely demineralized. Calcifications along a normal caliber abdominal aorta. IMPRESSION: 1. No fracture or acute finding. 2. Degenerative changes as detailed which progressed compared to the prior CT. Electronically Signed   By: Lajean Manes M.D.   On: 02/03/2020 10:23   MR LUMBAR SPINE WO CONTRAST  Result Date: 02/03/2020 CLINICAL DATA:  Low back pain.  Leukocytosis EXAM: MRI LUMBAR SPINE WITHOUT CONTRAST TECHNIQUE: Multiplanar, multisequence MR imaging of the lumbar spine was performed. No intravenous contrast was administered. COMPARISON:  X-ray 02/03/2020.  CT 10/27/2009 FINDINGS: Segmentation:  Standard. Alignment:  Physiologic. Vertebrae: Extensive fluid signal within the L3-4 disc space with lobulated fluid collection extending into the paravertebral soft tissues measuring approximately 9.2 x 6.0 cm trans axially (series 9, image 19). Components of the fluid collection extend into the bilateral psoas muscles, left greater than right (series 9, image 21). There is a small amount of fluid signal within the anterior aspect of the L2-3 disc space which is contiguous with the previously described fluid collection anterior to L3-4 (series 6, images 10-12). There are endplate irregularities within the L3 and L4 vertebral bodies centered at the L3-4 disc  space with  extensive bone marrow edema. There are also mild endplate irregularity at the anterior aspect of the L2-3 disc space with associated marrow edema. Conus medullaris and cauda equina: Conus extends to the L1 level. Conus and cauda equina appear normal. No epidural fluid collection is identified. Paraspinal and other soft tissues: Prevertebral fluid collection, as described above. Intramuscular edema within the bilateral psoas muscles. Posterior paraspinal muscle atrophy. Cholelithiasis. Disc levels: T12-L1: Sagittal sequences only.  Unremarkable. L1-L2: No disc protrusion. Bony overgrowth in the region of the left foramen results in mild foraminal narrowing without evidence of neural impingement. No canal stenosis. L2-L3: No disc protrusion. Mild bilateral facet hypertrophy and ligamentum flavum buckling. No foraminal or canal stenosis. L3-L4: Disc height loss with small posterior disc osteophyte complex. Bilateral facet arthropathy and ligamentum flavum hypertrophy. Findings result in moderate to severe left and mild right foraminal stenosis. Mild bilateral subarticular recess stenosis without canal stenosis. L4-L5: Shallow central disc protrusion. Mild bilateral facet hypertrophy. Extraforaminal osteophytic ridging is seen, left greater than right. No foraminal or canal stenosis. L5-S1: Right paracentral disc osteophyte complex with bilateral facet hypertrophy. Findings result in moderate right subarticular recess stenosis with impingement of the descending right S1 nerve root (series 9, image 33). There is mild-to-moderate bilateral foraminal stenosis. No canal stenosis. IMPRESSION: 1. Findings of discitis-osteomyelitis centered at the L3-4 level. There is also osteomyelitis-discitis at the anterior aspect of the L2-3 level. No evidence of epidural fluid collection or abscess. 2. Complex prevertebral fluid collection centered anterior to the L3-4 level measuring up to 9.2 cm. Fluid collection extends  into the bilateral psoas muscles, both of which are edematous. 3. Multilevel degenerative changes of the lumbar spine, as above. Moderate right subarticular recess stenosis at L5-S1 with impingement of the descending right S1 nerve root. 4. Moderate-to-severe left and mild right foraminal stenosis at L3-L4. 5. Cholelithiasis. These results were called by telephone at the time of interpretation on 02/03/2020 at 2:37 pm to provider [facility] , who verbally acknowledged these results. Electronically Signed   By: Davina Poke D.O.   On: 02/03/2020 14:39   VAS [place] LOWER EXTREMITY VENOUS (DVT) ([facility] and [facility] 7a-7p)  Result Date: 02/03/2020  Lower Venous DVTStudy Indications: Edema.  Comparison Study: No prior study on file Performing Technologist: Sharion Dove RVS  Examination Guidelines: A complete evaluation includes B-mode imaging, spectral Doppler, color Doppler, and power Doppler as needed of all accessible portions of each vessel. Bilateral testing is considered an integral part of a complete examination. Limited examinations for reoccurring indications may be performed as noted. The reflux portion of the exam is performed with the patient in reverse Trendelenburg.  +-----+---------------+---------+-----------+----------+--------------+ RIGHTCompressibilityPhasicitySpontaneityPropertiesThrombus Aging +-----+---------------+---------+-----------+----------+--------------+ CFV  Full           Yes      Yes                                 +-----+---------------+---------+-----------+----------+--------------+   +---------+---------------+---------+-----------+----------+--------------+ LEFT     CompressibilityPhasicitySpontaneityPropertiesThrombus Aging +---------+---------------+---------+-----------+----------+--------------+ CFV      Full           Yes      Yes                                 +---------+---------------+---------+-----------+----------+--------------+ SFJ      Full                                                         +---------+---------------+---------+-----------+----------+--------------+  FV Prox  Full                                                        +---------+---------------+---------+-----------+----------+--------------+ FV Mid   Full                                                        +---------+---------------+---------+-----------+----------+--------------+ FV DistalFull                                                        +---------+---------------+---------+-----------+----------+--------------+ PFV      Full                                                        +---------+---------------+---------+-----------+----------+--------------+ POP      Full           Yes      Yes                                 +---------+---------------+---------+-----------+----------+--------------+ PTV      Partial                                      Acute          +---------+---------------+---------+-----------+----------+--------------+ PERO     Partial                                      Acute          +---------+---------------+---------+-----------+----------+--------------+     Summary: RIGHT: - No evidence of common femoral vein obstruction.  LEFT: - Findings consistent with acute deep vein thrombosis involving the left posterior tibial veins, and left peroneal veins.  *See table(s) above for measurements and observations.    Preliminary     Procedures Procedures (including critical care time)  Medications Ordered in ED Medications  heparin ADULT infusion 100 units/mL (25000 units/219mL sodium chloride 0.45%) (1,500 Units/hr Intravenous New Bag/Given 02/03/20 1445)  oxyCODONE-acetaminophen (PERCOCET/ROXICET) 5-325 MG per tablet 1 tablet (1 tablet Oral Given 01/06/2020 2140)  oxyCODONE-acetaminophen (PERCOCET/ROXICET) 5-325 MG per tablet 1 tablet (1 tablet Oral Given 02/03/20 0939)  heparin bolus via infusion  5,500 Units (5,500 Units Intravenous Bolus from Bag 02/03/20 1445)    ED Course  I have reviewed the triage vital signs and the nursing notes.  Pertinent labs & imaging results that were available during my care of the patient were reviewed by me and considered in my medical decision making (see chart for details).    MDM Rules/Calculators/A&P  Patient presenting for evaluation of sudden onset low back pain radiating to the bilateral thighs.  Significant difficulty ambulating.  He is afebrile, vital signs are stable in the ED.  He is nontoxic in appearance.  He is neurovascularly intact.  Also developed left lower extremity edema and erythema noted around 1 week ago.  Lab work reviewed and interpreted by myself shows mild nonspecific leukocytosis, anemia at patient's baseline.  Renal insufficiency at patient's baseline.  UA equivocal for UTI and he does not have any urinary symptoms.  Work-up today significant for acute DVT involving the left posterior tibial veins and left peroneal veins.  He was started on heparin in the ED.  MRI concerning for discitis osteomyelitis centered at the L3-4 level with and anterior aspect of the L2-3 level.  There is a complex prevertebral fluid collection measuring 9.2 cm centered anteriorly to the L3-4 level.  The fluid collection extends into the bilateral psoas muscles both of which are edematous.  CONSULT: Spoke with NP Reinaldo Meeker who has reviewed the patient's imaging and work-up with Dr. Annette Stable.  They recommend hospitalist admission but will plan for aspiration of the fluid under IR guidance for culture and ID involvement.  She recommends keeping the patient n.p.o. until the procedure.  Also recommends holding off on initiation of IV antibiotics until the fluid culture has been obtained.  We will add on blood cultures.    The patient does not appear overtly septic at this time.  His pain has been well controlled with Percocet and on  reevaluation he is resting comfortably in bed.  Spoke with Dr. Jamse Arn with [facility] hospitalist service who agrees to assume care of patient and bring him to the hospital for further evaluation and management.   Final Clinical Impression(s) / ED Diagnoses Final diagnoses:  Discitis of lumbar region  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HCC)  Acute deep vein thrombosis (DVT) of distal vein of left lower extremity [facility])    Rx / DC Orders ED Discharge Orders    None         Renita Papa, PA-C 02/03/20 1549    Tegeler, Gwenyth Allegra, MD 02/04/20 1422

## 2020-02-03 NOTE — ED Notes (Signed)
Pharmacy at  The bedside

## 2020-02-03 NOTE — ED Notes (Signed)
Pt ate dinner  Has been watching tv and relaxing

## 2020-02-03 NOTE — ED Notes (Addendum)
Primofit curled up and off penis.  Patient incontinent of urine.  Condom cath to be placed instead.  Patient cleaned and mepilex and barrier cream applied to bilateral buttocks for stage 1 and stage 2 wounds on buttocks.

## 2020-02-03 NOTE — ED Notes (Addendum)
Pt requested CBG check. Received permission to check CBG from PA. Pt's CBG result was 147. Informed PA and Sarah - RN of result.

## 2020-02-03 DEATH — deceased

## 2020-02-04 ENCOUNTER — Encounter (HOSPITAL_COMMUNITY): Payer: Self-pay | Admitting: Internal Medicine

## 2020-02-04 ENCOUNTER — Inpatient Hospital Stay (HOSPITAL_COMMUNITY): Payer: Medicare Other

## 2020-02-04 DIAGNOSIS — M869 Osteomyelitis, unspecified: Secondary | ICD-10-CM

## 2020-02-04 DIAGNOSIS — G062 Extradural and subdural abscess, unspecified: Secondary | ICD-10-CM | POA: Diagnosis not present

## 2020-02-04 DIAGNOSIS — I350 Nonrheumatic aortic (valve) stenosis: Secondary | ICD-10-CM

## 2020-02-04 DIAGNOSIS — L8990 Pressure ulcer of unspecified site, unspecified stage: Secondary | ICD-10-CM | POA: Insufficient documentation

## 2020-02-04 LAB — BLOOD CULTURE ID PANEL (REFLEXED)

## 2020-02-04 LAB — GLUCOSE, CAPILLARY
Glucose-Capillary: 117 mg/dL — ABNORMAL HIGH (ref 70–99)
Glucose-Capillary: 165 mg/dL — ABNORMAL HIGH (ref 70–99)
Glucose-Capillary: 121 mg/dL — ABNORMAL HIGH (ref 70–99)
Glucose-Capillary: 109 mg/dL — ABNORMAL HIGH (ref 70–99)

## 2020-02-04 LAB — ECHOCARDIOGRAM COMPLETE
AR max vel: 1.25 cm2
AV Area VTI: 1.37 cm2
AV Area mean vel: 1.21 cm2
AV Mean grad: 17 mmHg
AV Peak grad: 25 mmHg
Ao pk vel: 2.5 m/s
Area-P 1/2: 2.26 cm2
Height: 73 in
S' Lateral: 4.1 cm
Weight: 3120 [oz_av]

## 2020-02-04 LAB — CBC
HCT: 36.4 % — ABNORMAL LOW (ref 39.0–52.0)
Hemoglobin: 11.5 g/dL — ABNORMAL LOW (ref 13.0–17.0)
MCH: 32.9 pg (ref 26.0–34.0)
MCHC: 31.6 g/dL (ref 30.0–36.0)
MCV: 104 fL — ABNORMAL HIGH (ref 80.0–100.0)
Platelets: 156 10*3/uL (ref 150–400)
RBC: 3.5 MIL/uL — ABNORMAL LOW (ref 4.22–5.81)
RDW: 14.7 % (ref 11.5–15.5)
WBC: 14.6 10*3/uL — ABNORMAL HIGH (ref 4.0–10.5)
nRBC: 0 % (ref 0.0–0.2)

## 2020-02-04 LAB — HEMOGLOBIN A1C
Hgb A1c MFr Bld: 6.4 % — ABNORMAL HIGH (ref 4.8–5.6)
Mean Plasma Glucose: 136.98 mg/dL

## 2020-02-04 LAB — HEPARIN LEVEL (UNFRACTIONATED)
Heparin Unfractionated: 0.48 IU/mL (ref 0.30–0.70)
Heparin Unfractionated: 0.57 [IU]/mL (ref 0.30–0.70)

## 2020-02-04 LAB — C-REACTIVE PROTEIN: CRP: 26.7 mg/dL — ABNORMAL HIGH (ref <1.0)

## 2020-02-04 LAB — BASIC METABOLIC PANEL
Anion gap: 8 (ref 5–15)
BUN: 58 mg/dL — ABNORMAL HIGH (ref 8–23)
CO2: 26 mmol/L (ref 22–32)
Calcium: 8.9 mg/dL (ref 8.9–10.3)
Chloride: 104 mmol/L (ref 98–111)
Creatinine, Ser: 2.51 mg/dL — ABNORMAL HIGH (ref 0.61–1.24)
GFR calc Af Amer: 25 mL/min — ABNORMAL LOW (ref >60)
GFR calc non Af Amer: 22 mL/min — ABNORMAL LOW (ref >60)
Glucose, Bld: 131 mg/dL — ABNORMAL HIGH (ref 70–99)
Potassium: 5 mmol/L (ref 3.5–5.1)
Sodium: 138 mmol/L (ref 135–145)

## 2020-02-04 MED ORDER — LACTATED RINGERS IV BOLUS
1000.0000 mL | Freq: Once | INTRAVENOUS | Status: AC
  Administered 2020-02-04: 1000 mL via INTRAVENOUS

## 2020-02-04 MED ORDER — CEFAZOLIN SODIUM-DEXTROSE 2-4 GM/100ML-% IV SOLN
2.0000 g | Freq: Two times a day (BID) | INTRAVENOUS | Status: DC
  Administered 2020-02-04 – 2020-02-08 (×10): 2 g via INTRAVENOUS
  Filled 2020-02-04 (×12): qty 100

## 2020-02-04 MED ORDER — FUROSEMIDE 40 MG PO TABS: 40.0000 mg | ORAL_TABLET | Freq: Two times a day (BID) | ORAL | Status: DC

## 2020-02-04 NOTE — Progress Notes (Signed)
ANTICOAGULATION CONSULT NOTE   Pharmacy Consult for Heparin Indication: DVT 02/03/20  No Known Allergies  Patient Measurements: Height: 6\' 1"  (185.4 cm) Weight: 88.5 kg (195 lb) IBW/kg (Calculated) : 79.9 Heparin Dosing Weight: 88.5  Vital Signs: Temp: 98.9 F (37.2 C) (08/02 0529) Temp Source: Oral (08/02 0529) BP: 125/58 (08/02 0529) Pulse Rate: 90 (08/02 0529)  Labs: Recent Labs    01/21/2020 2151 02/04/20 0004 02/04/20 0213  HGB 12.0*  --  11.5*  HCT 39.2  --  36.4*  PLT 149*  --  156  HEPARINUNFRC  --  0.48 0.57  CREATININE 2.76*  --  2.51*    Estimated Creatinine Clearance: 22.5 mL/min (A) (by C-G formula based on SCr of 2.51 mg/dL (H)).   Medical History: Past Medical History:  Diagnosis Date  . Anemia   . Callus   . Diabetes mellitus without complication ([place])   . Gastrointestinal ulcer   . Hypertension     Assessment: 86 YO M on heparin for new LLE DVT 02/03/20  No AC PTA.   HL 0.57 therapeutic. H/H, plt stable  Goal of Therapy:  Heparin level 0.3-0.7 units/ml Monitor platelets by anticoagulation protocol: Yes   Plan:  Cont heparin 1500 units/hr Monitor daily HL, CBC/plt Monitor for signs/symptoms of bleeding  F/u transition to apixaban vs warfarin when no procedures planned      Benetta Spar, PharmD, BCPS, [facility] Clinical Pharmacist  Please check AMION for all [place] phone numbers After 10:00 PM, call [place]

## 2020-02-04 NOTE — Progress Notes (Signed)
    [facility] has been requested to perform a transesophageal echocardiogram on 02/04/2020 for bacteremia.  After careful review of history and examination, the risks and benefits of transesophageal echocardiogram have been explained including risks of esophageal damage, perforation (1:10,000 risk), bleeding, pharyngeal hematoma as well as other potential complications associated with conscious sedation including aspiration, arrhythmia, respiratory failure and death. Alternatives to treatment were discussed, questions were answered. Patient is willing to proceed.   Patient presented with discitis and found to have positive blood culture in 3 out of 4 cultures. Febrile, but alert and oriented. BP stable. Cr 2.51. Hemoglobin 11.5. WBC 14.6. Plan to proceed with TEE to rule out endocarditis.   Almyra Deforest, PA-C 02/04/2020 3:22 PM

## 2020-02-04 NOTE — Progress Notes (Signed)
   02/04/20 1500  Assess: MEWS Score  Temp (!) 103.1 F (39.5 C)  BP (!) 124/39  Resp 17  SpO2 94 %  O2 Device Room Air  Assess: MEWS Score  MEWS Temp 2  MEWS Systolic 0  MEWS Pulse 0  MEWS RR 0  MEWS LOC 0  MEWS Score 2  MEWS Score Color Yellow  Assess: if the MEWS score is Yellow or Red  Were vital signs taken at a resting state? Yes  Focused Assessment No change from prior assessment  Early Detection of Sepsis Score *See Row Information* High  MEWS guidelines implemented *See Row Information* Yes  Treat  MEWS Interventions Escalated (See documentation below)  Pain Scale 0-10  Pain Score 0  Take Vital Signs  Increase Vital Sign Frequency  Yellow: Q 2hr X 2 then Q 4hr X 2, if remains yellow, continue Q 4hrs  Escalate  MEWS: Escalate Yellow: discuss with charge nurse/RN and consider discussing with provider and RRT  Notify: Charge Nurse/RN  Name of Charge Nurse/RN Notified Remo Lipps, RN  Date Charge Nurse/RN Notified 02/04/20  Time Charge Nurse/RN Notified 1500  Notify: Provider  Provider Name/Title Shahmehdi, MD  Date Provider Notified 02/04/20  Time Provider Notified 1521  Notification Type Page  Notification Reason Change in status  Response See new orders  Date of Provider Response 02/04/20  Time of Provider Response 1524  Document  Patient Outcome Stabilized after interventions  Progress note created (see row info) Yes   Patient has a yellow MEWS related to high temp. PRN tylenol given and Dr Roger Shelter ordered LR bolus. Patient and family updated as to plan. Will monitor to s/s of fluid overload r/t bolus.

## 2020-02-04 NOTE — Progress Notes (Signed)
  Echocardiogram 2D Echocardiogram has been performed.  Rokhaya Quinn G Azie Mcconahy 02/04/2020, 1:57 PM

## 2020-02-04 NOTE — H&P (Addendum)
Chief Complaint: Discitis  Referring Physician(s): Skipper Cliche  Supervising Physician: Pedro Earls  Patient Status: [facility] - In-pt  History of Present Illness: [name] is a 86 y.o. male who developed  sudden onset of back pain associated with lower extremity weakness on Saturday.  He was unable to get up after he sat down to eat lunch so he called EMS.  MRI showed = Findings of discitis-osteomyelitis centered at the L3-4 level. There is also osteomyelitis-discitis at the anterior aspect of the L2-3 level. No evidence of epidural fluid collection or abscess. Complex prevertebral fluid collection centered anterior to the L3-4 level measuring up to 9.2 cm. Fluid collection extends into the bilateral psoas muscles, both of which are edematous.  WBC was 11.8. He is febrile, 101.  He was also found to have an acute DVT in left leg and is on heparin drip.  We are asked to evaluate him for disc aspiration.  Past Medical History:  Diagnosis Date  . Anemia   . Callus   . Diabetes mellitus without complication ([place])   . Gastrointestinal ulcer   . Hypertension     History reviewed. No pertinent surgical history.  Allergies: Patient has no known allergies.  Medications: Prior to Admission medications   Medication Sig Start Date End Date Taking? Authorizing Provider  calcitRIOL (ROCALTROL) 0.25 MCG capsule Take 0.25 mcg by mouth daily.  10/17/18  Yes [provider]  diltiazem (CARDIZEM CD) 240 MG 24 hr capsule Take 240 mg by mouth daily. 10/11/19  Yes [provider]  Ferrous Sulfate (IRON PO) Take 65 mg by mouth daily.   Yes [provider]  furosemide (LASIX) 40 MG tablet Take 40 mg by mouth 2 (two) times daily. 10/11/19  Yes [provider]  JANUVIA 50 MG tablet Take 50 mg by mouth daily. 12/14/19  Yes [provider]  lactose free nutrition (BOOST) LIQD Take 237 mLs by mouth daily.   Yes [provider]  pravastatin (PRAVACHOL) 40 MG tablet Take 40 mg by mouth daily.  10/17/18  Yes [provider]  vitamin B-12 (CYANOCOBALAMIN) 100 MCG tablet Take 100 mcg by mouth daily.   Yes [provider]     No family history on file.  Social History   Socioeconomic History  . Marital status: Married    Spouse name: Not on file  . Number of children: Not on file  . Years of education: Not on file  . Highest education level: Not on file  Occupational History  . Not on file  Tobacco Use  . Smoking status: Never Smoker  . Smokeless tobacco: Never Used  Substance and Sexual Activity  . Alcohol use: No  . Drug use: No  . Sexual activity: Not on file  Other Topics Concern  . Not on file  Social History Narrative  . Not on file   Social Determinants of Health   Financial Resource Strain:   . Difficulty of Paying Living Expenses:   Food Insecurity:   . Worried About Charity fundraiser in the Last Year:   . Arboriculturist in the Last Year:   Transportation Needs:   . Film/video editor (Medical):   Marland Kitchen Lack of Transportation (Non-Medical):   Physical Activity:   . Days of Exercise per Week:   . Minutes of Exercise per Session:   Stress:   . Feeling of Stress :   Social Connections:   .  Frequency of Communication with Friends and Family:   . Frequency of Social Gatherings with Friends and Family:   . Attends Religious Services:   . Active Member of Clubs or Organizations:   . Attends Archivist Meetings:   Marland Kitchen Marital Status:      Review of Systems: A 12 point ROS discussed and pertinent positives are indicated in the HPI above.  All other systems are negative.  Review of Systems  Vital Signs: BP (!) 125/48 (BP Location: Right Arm)   Pulse 77   Temp (!) 101.4 F (38.6 C) (Oral)   Resp 17   Ht 6\' 1"  (1.854 m)   Wt 88.5 kg   SpO2 94%   BMI 25.73 kg/m   Physical Exam Vitals reviewed.  Constitutional:      Appearance: Normal  appearance.  HENT:     Head: Normocephalic and atraumatic.  Eyes:     Extraocular Movements: Extraocular movements intact.  Cardiovascular:     Rate and Rhythm: Normal rate and regular rhythm.     Heart sounds: Murmur heard.   Pulmonary:     Effort: Pulmonary effort is normal. No respiratory distress.     Breath sounds: Normal breath sounds.  Abdominal:     General: There is no distension.     Palpations: Abdomen is soft.     Tenderness: There is no abdominal tenderness.  Musculoskeletal:     Cervical back: Normal range of motion.     Lumbar back: Tenderness present.       Back:  Skin:    General: Skin is warm and dry.  Neurological:     General: No focal deficit present.     Mental Status: He is alert and oriented to person, place, and time.  Psychiatric:        Mood and Affect: Mood normal.        Behavior: Behavior normal.        Thought Content: Thought content normal.        Judgment: Judgment normal.     Imaging: DG Lumbar Spine Complete  Result Date: 02/03/2020 CLINICAL DATA:  Low back pain beginning yesterday evening with bilateral leg weakness. No injury. EXAM: LUMBAR SPINE - COMPLETE 4+ VIEW COMPARISON:  Abdomen and pelvis CT, 02/26/2010. FINDINGS: No fracture, bone lesion or spondylolisthesis. Mild loss of disc height at L3-L4, L4-L5 and L5-S1. Significant endplate sclerosis at I5-O2, to a lesser degree at L2-L3. There are large anterior osteophytes at L3-L4 and L2-L3. Facet joints are relatively well preserved. Skeletal structures are diffusely demineralized. Calcifications along a normal caliber abdominal aorta. IMPRESSION: 1. No fracture or acute finding. 2. Degenerative changes as detailed which progressed compared to the prior CT. Electronically Signed   By: Lajean Manes M.D.   On: 02/03/2020 10:23   MR LUMBAR SPINE WO CONTRAST  Result Date: 02/03/2020 CLINICAL DATA:  Low back pain.  Leukocytosis EXAM: MRI LUMBAR SPINE WITHOUT CONTRAST TECHNIQUE: Multiplanar,  multisequence MR imaging of the lumbar spine was performed. No intravenous contrast was administered. COMPARISON:  X-ray 02/03/2020.  CT 10/27/2009 FINDINGS: Segmentation:  Standard. Alignment:  Physiologic. Vertebrae: Extensive fluid signal within the L3-4 disc space with lobulated fluid collection extending into the paravertebral soft tissues measuring approximately 9.2 x 6.0 cm trans axially (series 9, image 19). Components of the fluid collection extend into the bilateral psoas muscles, left greater than right (series 9, image 21). There is a small amount of fluid signal within the anterior aspect of the  L2-3 disc space which is contiguous with the previously described fluid collection anterior to L3-4 (series 6, images 10-12). There are endplate irregularities within the L3 and L4 vertebral bodies centered at the L3-4 disc space with extensive bone marrow edema. There are also mild endplate irregularity at the anterior aspect of the L2-3 disc space with associated marrow edema. Conus medullaris and cauda equina: Conus extends to the L1 level. Conus and cauda equina appear normal. No epidural fluid collection is identified. Paraspinal and other soft tissues: Prevertebral fluid collection, as described above. Intramuscular edema within the bilateral psoas muscles. Posterior paraspinal muscle atrophy. Cholelithiasis. Disc levels: T12-L1: Sagittal sequences only.  Unremarkable. L1-L2: No disc protrusion. Bony overgrowth in the region of the left foramen results in mild foraminal narrowing without evidence of neural impingement. No canal stenosis. L2-L3: No disc protrusion. Mild bilateral facet hypertrophy and ligamentum flavum buckling. No foraminal or canal stenosis. L3-L4: Disc height loss with small posterior disc osteophyte complex. Bilateral facet arthropathy and ligamentum flavum hypertrophy. Findings result in moderate to severe left and mild right foraminal stenosis. Mild bilateral subarticular recess  stenosis without canal stenosis. L4-L5: Shallow central disc protrusion. Mild bilateral facet hypertrophy. Extraforaminal osteophytic ridging is seen, left greater than right. No foraminal or canal stenosis. L5-S1: Right paracentral disc osteophyte complex with bilateral facet hypertrophy. Findings result in moderate right subarticular recess stenosis with impingement of the descending right S1 nerve root (series 9, image 33). There is mild-to-moderate bilateral foraminal stenosis. No canal stenosis. IMPRESSION: 1. Findings of discitis-osteomyelitis centered at the L3-4 level. There is also osteomyelitis-discitis at the anterior aspect of the L2-3 level. No evidence of epidural fluid collection or abscess. 2. Complex prevertebral fluid collection centered anterior to the L3-4 level measuring up to 9.2 cm. Fluid collection extends into the bilateral psoas muscles, both of which are edematous. 3. Multilevel degenerative changes of the lumbar spine, as above. Moderate right subarticular recess stenosis at L5-S1 with impingement of the descending right S1 nerve root. 4. Moderate-to-severe left and mild right foraminal stenosis at L3-L4. 5. Cholelithiasis. These results were called by telephone at the time of interpretation on 02/03/2020 at 2:37 pm to provider [facility] , who verbally acknowledged these results. Electronically Signed   By: Davina Poke D.O.   On: 02/03/2020 14:39   VAS [place] LOWER EXTREMITY VENOUS (DVT) ([facility] and [facility] 7a-7p)  Result Date: 02/03/2020  Lower Venous DVTStudy Indications: Edema.  Comparison Study: No prior study on file Performing Technologist: Sharion Dove RVS  Examination Guidelines: A complete evaluation includes B-mode imaging, spectral Doppler, color Doppler, and power Doppler as needed of all accessible portions of each vessel. Bilateral testing is considered an integral part of a complete examination. Limited examinations for reoccurring indications may be performed as noted. The  reflux portion of the exam is performed with the patient in reverse Trendelenburg.  +-----+---------------+---------+-----------+----------+--------------+ RIGHTCompressibilityPhasicitySpontaneityPropertiesThrombus Aging +-----+---------------+---------+-----------+----------+--------------+ CFV  Full           Yes      Yes                                 +-----+---------------+---------+-----------+----------+--------------+   +---------+---------------+---------+-----------+----------+--------------+ LEFT     CompressibilityPhasicitySpontaneityPropertiesThrombus Aging +---------+---------------+---------+-----------+----------+--------------+ CFV      Full           Yes      Yes                                 +---------+---------------+---------+-----------+----------+--------------+  SFJ      Full                                                        +---------+---------------+---------+-----------+----------+--------------+ FV Prox  Full                                                        +---------+---------------+---------+-----------+----------+--------------+ FV Mid   Full                                                        +---------+---------------+---------+-----------+----------+--------------+ FV DistalFull                                                        +---------+---------------+---------+-----------+----------+--------------+ PFV      Full                                                        +---------+---------------+---------+-----------+----------+--------------+ POP      Full           Yes      Yes                                 +---------+---------------+---------+-----------+----------+--------------+ PTV      Partial                                      Acute          +---------+---------------+---------+-----------+----------+--------------+ PERO     Partial                                       Acute          +---------+---------------+---------+-----------+----------+--------------+     Summary: RIGHT: - No evidence of common femoral vein obstruction.  LEFT: - Findings consistent with acute deep vein thrombosis involving the left posterior tibial veins, and left peroneal veins.  *See table(s) above for measurements and observations. Electronically signed by Deitra Mayo MD on 02/03/2020 at 4:02:00 PM.    Final     Labs:  CBC: Recent Labs    03/01/19 0831 03/15/19 6203 01/17/20 5597 01/31/20 4163 01/29/2020 2151 02/04/20 0213  WBC  --   --   --   --  11.8* 14.6*  HGB 11.6*   < > 11.4* 11.4* 12.0* 11.5*  HCT 34.0*  --   --   --  39.2 36.4*  PLT  --   --   --   --  149* 156   < > = values in this interval not displayed.    COAGS: No results for input(s): INR, APTT in the last 8760 hours.  BMP: Recent Labs    12/06/19 0830 01/03/20 0911 01/29/2020 2151 02/04/20 0213  NA 143 143 141 138  K 4.4 4.2 4.3 5.0  CL 108 109 104 104  CO2 24 24 24 26   GLUCOSE 118* 114* 211* 131*  BUN 61* 76* 55* 58*  CALCIUM 8.9 9.4 9.4 8.9  CREATININE 2.54* 2.52* 2.76* 2.51*  GFRNONAA 22* 22* 19* 22*  GFRAA 25* 25* 23* 25*    LIVER FUNCTION TESTS: Recent Labs    11/08/19 0818 12/06/19 0830 01/03/20 0911 01/25/2020 2151  BILITOT  --   --   --  0.4  AST  --   --   --  13*  ALT  --   --   --  14  ALKPHOS  --   --   --  102  PROT  --   --   --  6.9  ALBUMIN 3.1* 3.2* 3.5 3.4*    TUMOR MARKERS: No results for input(s): AFPTM, CEA, CA199, CHROMGRNA in the last 8760 hours.  Assessment and Plan:  Severe back pain with lower extremity weakness.  Findings of discitis-osteomyelitis centered at the L3-4 level. There is also osteomyelitis-discitis at the anterior aspect of the L2-3 level. No evidence of epidural fluid collection or abscess. Complex prevertebral fluid collection centered anterior to the L3-4 level measuring up to 9.2 cm.   Images reviewed by Dr. Karenann Cai     Will proceed with image guided disc aspiration tomorrow (patient is not currently NPO).  Will order NPO after midnight.   IR Nurse will call patient's nurse tomorrow to stop heparin drip 1 hour prior to procedure.  Thank you for this interesting consult.  I greatly enjoyed meeting Makoto Z Baldree and look forward to participating in their care.  A copy of this report was sent to the requesting provider on this date.  Electronically Signed: Murrell Redden, PA-C   02/04/2020, 11:28 AM      I spent a total of 40 Minutes in face to face in clinical consultation, greater than 50% of which was counseling/coordinating care for disc aspiration.

## 2020-02-04 NOTE — Consult Note (Signed)
[facility] for Infectious [facility]    Date of Admission:  01/18/2020     Total days of antibiotics 1               Reason for Consult: MSSA Bacteremia   Referring Provider: [facility] / [employer] Primary Care Provider: Haywood Pao, MD   ASSESSMENT:  [name] is an 86 y/o male with L2-L3 and L3-L4 discitis/osteomyelitis with complex prevertebral fluid collection complicated by development of MSSA bacteremia. Agree with current antimicrobial therapy with Cefazolin. TTE pending and will repeat blood cultures. Neurosurgery has consulted IR for aspiration which is planned for tomorrow. Source of infection unclear at present although does have reddened area around his left lateral foot that has been going on for several days. Will likely require at least 6 weeks of antibiotics going forward.   PLAN:  1. Continue current dose of cefazolin.  2. Check blood cultures for clearance of bacteremia.  3. TTE results pending.  4. Await IR aspiration.    Principal Problem:   Epidural abscess Active Problems:   Diabetes ([place])   Anemia of chronic disease   Hypertension   Pressure injury of skin   . diltiazem  240 mg Oral Daily  . furosemide  40 mg Oral BID  . insulin aspart  0-6 Units Subcutaneous TID WC  . lactose free nutrition  237 mL Oral Daily  . pravastatin  40 mg Oral Daily     HPI: [name] is a 86 y.o. male with previous medical history of hypertension, Type 2 diabetes, right ankle surgery and anemia admitted with 5 day history of non-traumatic worsening back pain.  [name] started having back pain about 5 days ago when he had acute onset back pain with lower extremity weakness. No trauma or injury that he could recall. He did have a previous fall several weeks ago but did not injure his bact. He lives independently and ambulates with a walker. Severity effected his ability to stand after being seated after eating lunch. No fevers or chills. Denies any  saddle anesthesia or changes to bowel or bladder function. Has weakness in his bilateral legs with radiculopathy on the right side.   [name] was febrile today with temperature of 101.4. and leukocytosis with WBC count of 14.6. Blood cultures are positive for MSSA. Neurosurgery with no current surgical intervention and have consulted IR for aspiration planned for tomorrow. [name] son is present in the room for visit today and contributes to the history.   Review of Systems: Review of Systems  Constitutional: Negative for chills, fever and weight loss.  Respiratory: Negative for cough, shortness of breath and wheezing.   Cardiovascular: Negative for chest pain and leg swelling.  Gastrointestinal: Negative for abdominal pain, constipation, diarrhea, nausea and vomiting.  Musculoskeletal: Positive for back pain.  Skin: Negative for rash.  Neurological: Positive for focal weakness.     Past Medical History:  Diagnosis Date  . Anemia   . Callus   . Diabetes mellitus without complication ([place])   . Gastrointestinal ulcer   . Hypertension     Social History   Tobacco Use  . Smoking status: Never Smoker  . Smokeless tobacco: Never Used  Substance Use Topics  . Alcohol use: No  . Drug use: No    History reviewed. No pertinent family history.  No Known Allergies  OBJECTIVE: Blood pressure (!) 125/48, pulse 77, temperature (!) 101.4 F (38.6 C), temperature source Oral, resp.  rate 17, height 6\' 1"  (1.854 m), weight 88.5 kg, SpO2 94 %.  Physical Exam Constitutional:      General: He is not in acute distress.    Appearance: He is well-developed.     Comments: Lying in bed with head of bed elevated; pleasant.   Cardiovascular:     Rate and Rhythm: Normal rate and regular rhythm.     Heart sounds: Normal heart sounds.  Pulmonary:     Effort: Pulmonary effort is normal.     Breath sounds: Normal breath sounds.  Musculoskeletal:     Comments: Back with no obvious  deformity, discoloration, or edema. Able to move toes bilaterally. Muscle strength of lower extremity is 3/5 and able to lift bilateral legs off bed but not through full range of motion. Distal pulses and sensation are intact. Has redness around lateral left foot on dorsal aspect. Chronic bruising/discorolation around right upper ankle. There is a hard callus on the plantar surface of the 1st metatarsal head.   Skin:    General: Skin is warm and dry.  Neurological:     Mental Status: He is alert and oriented to person, place, and time.  Psychiatric:        Behavior: Behavior normal.        Thought Content: Thought content normal.        Judgment: Judgment normal.     Lab Results Lab Results  Component Value Date   WBC 14.6 (H) 02/04/2020   HGB 11.5 (L) 02/04/2020   HCT 36.4 (L) 02/04/2020   MCV 104.0 (H) 02/04/2020   PLT 156 02/04/2020    Lab Results  Component Value Date   CREATININE 2.51 (H) 02/04/2020   BUN 58 (H) 02/04/2020   NA 138 02/04/2020   K 5.0 02/04/2020   CL 104 02/04/2020   CO2 26 02/04/2020    Lab Results  Component Value Date   ALT 14 01/07/2020   AST 13 (L) 01/30/2020   ALKPHOS 102 01/25/2020   BILITOT 0.4 01/30/2020     Microbiology: Recent Results (from the past 240 hour(s))  SARS Coronavirus 2 by RT PCR (hospital order, performed in [facility] hospital lab) Nasopharyngeal Nasopharyngeal Swab     Status: None   Collection Time: 02/03/20  2:27 PM   Specimen: Nasopharyngeal Swab  Result Value Ref Range Status   SARS Coronavirus 2 NEGATIVE NEGATIVE Final    Comment: (NOTE) SARS-CoV-2 target nucleic acids are NOT DETECTED.  The SARS-CoV-2 RNA is generally detectable in upper and lower respiratory specimens during the acute phase of infection. The lowest concentration of SARS-CoV-2 viral copies this assay can detect is 250 copies / mL. A negative result does not preclude SARS-CoV-2 infection and should not be used as the sole basis for treatment or  other patient management decisions.  A negative result may occur with improper specimen collection / handling, submission of specimen other than nasopharyngeal swab, presence of viral mutation(s) within the areas targeted by this assay, and inadequate number of viral copies (<250 copies / mL). A negative result must be combined with clinical observations, patient history, and epidemiological information.  Fact Sheet for Patients:   StrictlyIdeas.no  Fact Sheet for Healthcare Providers: BankingDealers.co.za  This test is not yet approved or  cleared by the [place] FDA and has been authorized for detection and/or diagnosis of SARS-CoV-2 by FDA under an Emergency Use Authorization (EUA).  This EUA will remain in effect (meaning this test can be used) for the  duration of the COVID-19 declaration under Section 564(b)(1) of the Act, 21 U.S.C. section 360bbb-3(b)(1), unless the authorization is terminated or revoked sooner.  Performed at [facility], [place] [address] [place], [place] [address]   Blood culture (routine x 2)     Status: None (Preliminary result)   Collection Time: 02/03/20  4:41 PM   Specimen: BLOOD LEFT HAND  Result Value Ref Range Status   Specimen Description BLOOD LEFT HAND  Final   Special Requests   Final    BOTTLES DRAWN AEROBIC AND ANAEROBIC Blood Culture adequate volume   Culture  Setup Time   Final    GRAM POSITIVE COCCI IN CLUSTERS IN BOTH AEROBIC AND ANAEROBIC BOTTLES Organism ID to follow CRITICAL RESULT CALLED TO, READ BACK BY AND VERIFIED WITH: A. Rogers Blocker PharmD 8:50 02/04/20 ([email or web address])    Culture   Final    NO GROWTH < 24 HOURS Performed at [facility], [place] [address], [place], [place] [address]    Report Status PENDING  Incomplete  Blood Culture ID Panel (Reflexed)     Status: Abnormal   Collection Time: 02/03/20  4:41 PM  Result Value Ref Range Status   Enterococcus species NOT DETECTED  NOT DETECTED Final   Listeria monocytogenes NOT DETECTED NOT DETECTED Final   Staphylococcus species DETECTED (A) NOT DETECTED Final    Comment: CRITICAL RESULT CALLED TO, READ BACK BY AND VERIFIED WITH: A. Rogers Blocker PharmD 8:50 02/04/20 ([email or web address])    Staphylococcus aureus (BCID) DETECTED (A) NOT DETECTED Final    Comment: Methicillin (oxacillin) susceptible Staphylococcus aureus (MSSA). Preferred therapy is anti staphylococcal beta lactam antibiotic (Cefazolin or Nafcillin), unless clinically contraindicated. CRITICAL RESULT CALLED TO, READ BACK BY AND VERIFIED WITH: A. Rogers Blocker PharmD 8:50 02/04/20 ([email or web address])    Methicillin resistance NOT DETECTED NOT DETECTED Final   Streptococcus species NOT DETECTED NOT DETECTED Final   Streptococcus agalactiae NOT DETECTED NOT DETECTED Final   Streptococcus pneumoniae NOT DETECTED NOT DETECTED Final   Streptococcus pyogenes NOT DETECTED NOT DETECTED Final   Acinetobacter baumannii NOT DETECTED NOT DETECTED Final   Enterobacteriaceae species NOT DETECTED NOT DETECTED Final   Enterobacter cloacae complex NOT DETECTED NOT DETECTED Final   Escherichia coli NOT DETECTED NOT DETECTED Final   Klebsiella oxytoca NOT DETECTED NOT DETECTED Final   Klebsiella pneumoniae NOT DETECTED NOT DETECTED Final   Proteus species NOT DETECTED NOT DETECTED Final   Serratia marcescens NOT DETECTED NOT DETECTED Final   Haemophilus influenzae NOT DETECTED NOT DETECTED Final   Neisseria meningitidis NOT DETECTED NOT DETECTED Final   Pseudomonas aeruginosa NOT DETECTED NOT DETECTED Final   Candida albicans NOT DETECTED NOT DETECTED Final   Candida glabrata NOT DETECTED NOT DETECTED Final   Candida krusei NOT DETECTED NOT DETECTED Final   Candida parapsilosis NOT DETECTED NOT DETECTED Final   Candida tropicalis NOT DETECTED NOT DETECTED Final    Comment: Performed at [facility], [place] [address], [place], [place] [address]  Blood culture (routine x 2)     Status: None  (Preliminary result)   Collection Time: 02/03/20  4:45 PM   Specimen: BLOOD RIGHT HAND  Result Value Ref Range Status   Specimen Description BLOOD RIGHT HAND  Final   Special Requests   Final    BOTTLES DRAWN AEROBIC AND ANAEROBIC Blood Culture adequate volume   Culture  Setup Time   Final    GRAM POSITIVE COCCI IN CLUSTERS IN BOTH AEROBIC AND ANAEROBIC BOTTLES CRITICAL VALUE  NOTED.  VALUE IS CONSISTENT WITH PREVIOUSLY REPORTED AND CALLED VALUE.    Culture   Final    NO GROWTH < 24 HOURS Performed at [facility], [place] [address] [place], [place] [address]    Report Status PENDING  Incomplete     Terri Piedra, [facility]  02/04/2020  2:12 PM

## 2020-02-04 NOTE — Progress Notes (Signed)
ANTICOAGULATION CONSULT NOTE   Pharmacy Consult for Heparin Indication: DVT  No Known Allergies  Patient Measurements: Height: 6\' 1"  (185.4 cm) Weight: 88.5 kg (195 lb) IBW/kg (Calculated) : 79.9 Heparin Dosing Weight: 88.5  Vital Signs: Temp: 100.5 F (38.1 C) (08/01 2339) Temp Source: Rectal (08/01 2339) BP: 143/59 (08/02 0000) Pulse Rate: 96 (08/02 0000)  Labs: Recent Labs    01/30/2020 2151 02/04/20 0004  HGB 12.0*  --   HCT 39.2  --   PLT 149*  --   HEPARINUNFRC  --  0.48  CREATININE 2.76*  --     Estimated Creatinine Clearance: 20.5 mL/min (A) (by C-G formula based on SCr of 2.76 mg/dL (H)).   Medical History: Past Medical History:  Diagnosis Date  . Anemia   . Callus   . Diabetes mellitus without complication ([place])   . Gastrointestinal ulcer   . Hypertension     Assessment: 70 YOM presenting with low back pain and bilateral leg weakness with PMH of chronic anemia and CKD. Patient is not on anticoagulation PTA. Heparin for DVT.   8/2 AM update:  Initial heparin level therapeutic   Goal of Therapy:  Heparin level 0.3-0.7 units/ml Monitor platelets by anticoagulation protocol: Yes   Plan:  Cont heparin 1500 units/hr Confirmatory heparin level with AM labs Continue to monitor H&H and platelets    Narda Bonds, PharmD, [place] Pharmacist Phone: [phone]

## 2020-02-04 NOTE — Progress Notes (Signed)
PHARMACY - PHYSICIAN COMMUNICATION CRITICAL VALUE ALERT - BLOOD CULTURE IDENTIFICATION (BCID)  [name] is an 86 y.o. male who presented to [facility] on 01/08/2020 with a chief complaint of sudden back pain with lower extremity weakness.  Assessment:  Patient with MRI evidence of osteomyelitis at L2-3 with epidural abscess (9.2 cm) extending to psoas muscles now with 3/4 positive Bcx for MSSA. Team was holding antibiotics until IR aspiration and culture results. Now starting cefazolin due to positive blood cultures.  Name of physician (or Provider) Contacted: Skipper Cliche  Current antibiotics: none  Changes to prescribed antibiotics recommended:  Cefazolin 2g q12h (per renal adjustment)  Recommendations accepted by provider  Results for orders placed or performed during the hospital encounter of 01/12/2020  Blood Culture ID Panel (Reflexed) (Collected: 02/03/2020  4:41 PM)  Result Value Ref Range   Enterococcus species NOT DETECTED NOT DETECTED   Listeria monocytogenes NOT DETECTED NOT DETECTED   Staphylococcus species DETECTED (A) NOT DETECTED   Staphylococcus aureus (BCID) DETECTED (A) NOT DETECTED   Methicillin resistance NOT DETECTED NOT DETECTED   Streptococcus species NOT DETECTED NOT DETECTED   Streptococcus agalactiae NOT DETECTED NOT DETECTED   Streptococcus pneumoniae NOT DETECTED NOT DETECTED   Streptococcus pyogenes NOT DETECTED NOT DETECTED   Acinetobacter baumannii NOT DETECTED NOT DETECTED   Enterobacteriaceae species NOT DETECTED NOT DETECTED   Enterobacter cloacae complex NOT DETECTED NOT DETECTED   Escherichia coli NOT DETECTED NOT DETECTED   Klebsiella oxytoca NOT DETECTED NOT DETECTED   Klebsiella pneumoniae NOT DETECTED NOT DETECTED   Proteus species NOT DETECTED NOT DETECTED   Serratia marcescens NOT DETECTED NOT DETECTED   Haemophilus influenzae NOT DETECTED NOT DETECTED   Neisseria meningitidis NOT DETECTED NOT DETECTED   Pseudomonas aeruginosa  NOT DETECTED NOT DETECTED   Candida albicans NOT DETECTED NOT DETECTED   Candida glabrata NOT DETECTED NOT DETECTED   Candida krusei NOT DETECTED NOT DETECTED   Candida parapsilosis NOT DETECTED NOT DETECTED   Candida tropicalis NOT DETECTED NOT DETECTED    Esmond Plants 02/04/2020  9:03 AM

## 2020-02-04 NOTE — Progress Notes (Signed)
Patient ID: FAITH BRANAN, male   DOB: 1927/11/07, 86 y.o.   MRN: [id number] BP (!) 125/58 (BP Location: Right Arm)   Pulse 90   Temp 98.9 F (37.2 C) (Oral)   Resp 16   Ht 6\' 1"  (1.854 m)   Wt 88.5 kg   SpO2 95%   BMI 25.73 kg/m  Called regarding this 86yo gentleman. He has a osteomyelitis and discitis at L2/3,3/4. There is no epidural abscess identified on the MRI.  Believe this best treated with aspiration by IR to identify the organism, and ensure it is the same as the blood cx's which showed staph aureus. ID should follow and be consulted. No surgical indications at this time. If there are questions please contact me.

## 2020-02-04 NOTE — Progress Notes (Addendum)
PROGRESS NOTE    Patient: [name]                            PCP: Haywood Pao, MD                    DOB: 12/15/1927            DOA: 01/20/2020 ZOX:[id number]             DOS: 02/04/2020, 2:28 PM   LOS: 1 day   Date of Service: The patient was seen and examined on 02/04/2020  Subjective:   The patient was seen and examined this Am. Stable, T-max this morning 101.4, otherwise hemodynamically stable Still complaining of back pain, but stable in bed, finds relief with pain medication Patient reports he has not seen any consultants since admission.  Son present at bedside. Brief Narrative:  Per HPI: [name] is an 86 y.o. male with HTN, DM 2 who was in his usual state of health until 2:00 PM 01/12/2020 when he noted sudden onset of back pain associated with lower extremity weakness.    Patient reports of generalized weakness, probably due to his low iron, he gets IV iron with his nephrologist twice a month.  Patient subsequently had acute lower extremity weakness, and back pain unable to ambulate  ED evaluation: He underwent Doppler study which did show an acute DVT.  Patient underwent an MRI L-spine which found osteomyelitis/discitis at the L2-3 level with an epidural abscess measuring 9.2 cm extending down into the bilateral psoas muscles.   Apparently patient was seen by neurosurgery in the ED but no notes can be found Apparently gave oral recommendation to IR aspiration, blood cultures  02/04/2020 -patient hemodynamically stable exception of a T-max of 101.4, 3 out of 4 blood culture positive for staph.   -Patient was not on any antibiotics, no notes from consultants No consultation to IR or IV was placed. Neurosurgery/infectious disease, IR, pharmacy were all consulted. Case discussed with cardiology regarding TEE Patient status was discussed in detail with patient and his son at bedside.     Assessment & Plan:   Principal Problem:   Epidural  abscess Active Problems:   Diabetes ([place])   Anemia of chronic disease   Hypertension   Pressure injury of skin   Possible epidural abscess fluid collection/ Discitis / Osteomyelitis L3-4  -MRI of spine was reviewed 9 cm epidural fluid was identified extending into bilateral psoas -Patient was not on any antibiotics, no notes from consultants No consultation to IR or IV was found in Epic. Neurosurgery/ Infectious Disease, IR, pharmacy were all consulted. Case discussed with cardiology regarding TEE  -Neuro surgery believe that this is not epidural abscess, recommending IR for drain, cultures and cytology  - Interventional radiology consulted, case is discussed in detail, anticipated with CT-guided aspiration in a.m. -Patient be kept n.p.o. after midnight -Infectious disease consulted -broad-spectrum antibiotic initiated  Bacteremia - 3/4 blood cultures are growing staph (likely MSSA) -Initiating broad-spectrum antibiotics cefazolin 2 g every 12 hours -Cardiology consulted, echocardiogram, anticipating getting TEE  Acute DVT Patient started on IV heparin in ED -Once stable, anticipating transition patient to oral anticoagulant  CKD Patient has known chronic kidney disease with GFR of 20, creatinine is at baseline.  HTN Continue diltiazem and Lasix per home doses  DM 2 Hold Januvia CBG with very low SSI coverage given CKD. Will be important to  have good blood sugar control to eradicate infection however he is also at high risk for hypoglycemia.   Patient is DNR Discussed with patient, he is clear that he would not like to be resuscitated should his heart stop.  Notes "I am 86 years old of had a great life.  My wife died this way, she died with me holding her hand."    Nutritional status:          Cultures; Blood Cultures x 2 >> NGT   Antimicrobials: 8/2/2021cefazolin 2 g every 12 hours    Consultants:  -Neurosurgery -Infectious disease -Interventional  radiology -Pharmacy     ------------------------------------------------------------------------------------------------------------------------------------ DVT prophylaxis:  On heparin drip  Code Status:   Code Status: DNR Family Communication: Patient's son present at bedside discussed with the patient and her son updated with details -expressed understanding agree with the current plan The above findings and plan of care has been discussed with patient (patient's son)  in detail,  they expressed understanding and agreement of above. -Advance care planning has been discussed.   Admission status:    Status is: Inpatient  Remains inpatient appropriate because:Inpatient level of care appropriate due to severity of illness   Dispo: The patient is from: Home              Anticipated d/c is to: Home              Anticipated d/c date is: > 3 days              Patient currently is not medically stable to d/c.        Procedures:   03/02/2020 anticipating CT-guided IR drainage perisplenic fluid 02/12/2020 TEE    Antimicrobials:  Anti-infectives (From admission, onward)   Start     Dose/Rate Route Frequency Ordered Stop   02/04/20 1000  ceFAZolin (ANCEF) IVPB 2g/100 mL premix     Discontinue     2 g 200 mL/hr over 30 Minutes Intravenous Every 12 hours 02/04/20 0903         Medication:  . diltiazem  240 mg Oral Daily  . furosemide  40 mg Oral BID  . insulin aspart  0-6 Units Subcutaneous TID WC  . lactose free nutrition  237 mL Oral Daily  . pravastatin  40 mg Oral Daily    acetaminophen **OR** acetaminophen, bisacodyl, HYDROmorphone (DILAUDID) injection, oxyCODONE, polyethylene glycol   Objective:   Vitals:   02/04/20 0000 02/04/20 0112 02/04/20 0529 02/04/20 1022  BP: (!) 143/59 (!) 146/57 (!) 125/58 (!) 125/48  Pulse: 96 98 90 77  Resp: 15 18 16 17   Temp:  98.4 F (36.9 C) 98.9 F (37.2 C) (!) 101.4 F (38.6 C)  TempSrc:   Oral Oral  SpO2: 93% 99% 95%  94%  Weight:      Height:        Intake/Output Summary (Last 24 hours) at 02/04/2020 1428 Last data filed at 02/04/2020 0945 Gross per 24 hour  Intake 1917.59 ml  Output 250 ml  Net 1667.59 ml   Filed Weights   02/03/20 1426  Weight: 88.5 kg     Examination:   Physical Exam  Constitution:  Alert, cooperative, no distress,  Appears calm and comfortable  Psychiatric: Normal and stable mood and affect, cognition intact,   HEENT: Normocephalic, PERRL, otherwise with in Normal limits  Chest:Chest symmetric Cardio vascular:  S1/S2, RRR, No murmure, No Rubs or Gallops  pulmonary: Clear to auscultation bilaterally, respirations unlabored, negative wheezes /  crackles Abdomen: Soft, non-tender, non-distended, bowel sounds,no masses, no organomegaly Muscular skeletal: Limited exam - in bed, able to move all 2 extremities, sensation intact, significant 3 out of 5 lower extremity weakness, Neuro: CNII-XII intact. , normal motor and sensation, reflexes intact  Extremities: No pitting edema lower extremities, +2 pulses  Skin: Dry, warm to touch, negative for any Rashes, No open wounds Wounds: per nursing documentation Pressure Injury 02/03/20 Buttocks Right Stage 2 -  Partial thickness loss of dermis presenting as a shallow open injury with a red, pink wound bed without slough. (Active)  02/03/20 2357  Location: Buttocks  Location Orientation: Right  Staging: Stage 2 -  Partial thickness loss of dermis presenting as a shallow open injury with a red, pink wound bed without slough.  Wound Description (Comments):   Present on Admission: Yes     Pressure Injury 02/04/20 Buttocks Left Stage 1 -  Intact skin with non-blanchable redness of a localized area usually over a bony prominence. (Active)  02/04/20   Location: Buttocks  Location Orientation: Left  Staging: Stage 1 -  Intact skin with non-blanchable redness of a localized area usually over a bony prominence.  Wound Description (Comments):    Present on Admission: Yes    ------------------------------------------------------------------------------------------------------------------------------------------    LABs:  CBC Latest Ref Rng & Units 02/04/2020 01/06/2020 01/31/2020  WBC 4.0 - 10.5 K/uL 14.6(H) 11.8(H) -  Hemoglobin 13.0 - 17.0 g/dL 11.5(L) 12.0(L) 11.4(L)  Hematocrit 39 - 52 % 36.4(L) 39.2 -  Platelets 150 - 400 K/uL 156 149(L) -   CMP Latest Ref Rng & Units 02/04/2020 01/12/2020 01/03/2020  Glucose 70 - 99 mg/dL 131(H) 211(H) 114(H)  BUN 8 - 23 mg/dL 58(H) 55(H) 76(H)  Creatinine 0.61 - 1.24 mg/dL 2.51(H) 2.76(H) 2.52(H)  Sodium 135 - 145 mmol/L 138 141 143  Potassium 3.5 - 5.1 mmol/L 5.0 4.3 4.2  Chloride 98 - 111 mmol/L 104 104 109  CO2 22 - 32 mmol/L 26 24 24   Calcium 8.9 - 10.3 mg/dL 8.9 9.4 9.4  Total Protein 6.5 - 8.1 g/dL - 6.9 -  Total Bilirubin 0.3 - 1.2 mg/dL - 0.4 -  Alkaline Phos 38 - 126 U/L - 102 -  AST 15 - 41 U/L - 13(L) -  ALT 0 - 44 U/L - 14 -       Micro Results Recent Results (from the past 240 hour(s))  SARS Coronavirus 2 by RT PCR (hospital order, performed in [facility] hospital lab) Nasopharyngeal Nasopharyngeal Swab     Status: None   Collection Time: 02/03/20  2:27 PM   Specimen: Nasopharyngeal Swab  Result Value Ref Range Status   SARS Coronavirus 2 NEGATIVE NEGATIVE Final    Comment: (NOTE) SARS-CoV-2 target nucleic acids are NOT DETECTED.  The SARS-CoV-2 RNA is generally detectable in upper and lower respiratory specimens during the acute phase of infection. The lowest concentration of SARS-CoV-2 viral copies this assay can detect is 250 copies / mL. A negative result does not preclude SARS-CoV-2 infection and should not be used as the sole basis for treatment or other patient management decisions.  A negative result may occur with improper specimen collection / handling, submission of specimen other than nasopharyngeal swab, presence of viral mutation(s) within  the areas targeted by this assay, and inadequate number of viral copies (<250 copies / mL). A negative result must be combined with clinical observations, patient history, and epidemiological information.  Fact Sheet for Patients:   StrictlyIdeas.no  Fact Sheet  for Healthcare Providers: BankingDealers.co.za  This test is not yet approved or  cleared by the [place] and has been authorized for detection and/or diagnosis of SARS-CoV-2 by FDA under an Emergency Use Authorization (EUA).  This EUA will remain in effect (meaning this test can be used) for the duration of the COVID-19 declaration under Section 564(b)(1) of the Act, 21 U.S.C. section 360bbb-3(b)(1), unless the authorization is terminated or revoked sooner.  Performed at [facility], [place] [address], [place], [place] [address]   Blood culture (routine x 2)     Status: None (Preliminary result)   Collection Time: 02/03/20  4:41 PM   Specimen: BLOOD LEFT HAND  Result Value Ref Range Status   Specimen Description BLOOD LEFT HAND  Final   Special Requests   Final    BOTTLES DRAWN AEROBIC AND ANAEROBIC Blood Culture adequate volume   Culture  Setup Time   Final    GRAM POSITIVE COCCI IN CLUSTERS IN BOTH AEROBIC AND ANAEROBIC BOTTLES Organism ID to follow CRITICAL RESULT CALLED TO, READ BACK BY AND VERIFIED WITH: A. Rogers Blocker PharmD 8:50 02/04/20 ([email or web address])    Culture   Final    NO GROWTH < 24 HOURS Performed at [facility], [place] [address], [place], [place] [address]    Report Status PENDING  Incomplete  Blood Culture ID Panel (Reflexed)     Status: Abnormal   Collection Time: 02/03/20  4:41 PM  Result Value Ref Range Status   Enterococcus species NOT DETECTED NOT DETECTED Final   Listeria monocytogenes NOT DETECTED NOT DETECTED Final   Staphylococcus species DETECTED (A) NOT DETECTED Final    Comment: CRITICAL RESULT CALLED TO, READ BACK BY AND VERIFIED  WITH: A. Rogers Blocker PharmD 8:50 02/04/20 ([email or web address])    Staphylococcus aureus (BCID) DETECTED (A) NOT DETECTED Final    Comment: Methicillin (oxacillin) susceptible Staphylococcus aureus (MSSA). Preferred therapy is anti staphylococcal beta lactam antibiotic (Cefazolin or Nafcillin), unless clinically contraindicated. CRITICAL RESULT CALLED TO, READ BACK BY AND VERIFIED WITH: A. Rogers Blocker PharmD 8:50 02/04/20 ([email or web address])    Methicillin resistance NOT DETECTED NOT DETECTED Final   Streptococcus species NOT DETECTED NOT DETECTED Final   Streptococcus agalactiae NOT DETECTED NOT DETECTED Final   Streptococcus pneumoniae NOT DETECTED NOT DETECTED Final   Streptococcus pyogenes NOT DETECTED NOT DETECTED Final   Acinetobacter baumannii NOT DETECTED NOT DETECTED Final   Enterobacteriaceae species NOT DETECTED NOT DETECTED Final   Enterobacter cloacae complex NOT DETECTED NOT DETECTED Final   Escherichia coli NOT DETECTED NOT DETECTED Final   Klebsiella oxytoca NOT DETECTED NOT DETECTED Final   Klebsiella pneumoniae NOT DETECTED NOT DETECTED Final   Proteus species NOT DETECTED NOT DETECTED Final   Serratia marcescens NOT DETECTED NOT DETECTED Final   Haemophilus influenzae NOT DETECTED NOT DETECTED Final   Neisseria meningitidis NOT DETECTED NOT DETECTED Final   Pseudomonas aeruginosa NOT DETECTED NOT DETECTED Final   Candida albicans NOT DETECTED NOT DETECTED Final   Candida glabrata NOT DETECTED NOT DETECTED Final   Candida krusei NOT DETECTED NOT DETECTED Final   Candida parapsilosis NOT DETECTED NOT DETECTED Final   Candida tropicalis NOT DETECTED NOT DETECTED Final    Comment: Performed at [facility], [place] [address] [place], [place] [address]  Blood culture (routine x 2)     Status: None (Preliminary result)   Collection Time: 02/03/20  4:45 PM   Specimen: BLOOD RIGHT HAND  Result Value Ref Range Status  Specimen Description BLOOD RIGHT HAND  Final   Special Requests   Final    BOTTLES  DRAWN AEROBIC AND ANAEROBIC Blood Culture adequate volume   Culture  Setup Time   Final    GRAM POSITIVE COCCI IN CLUSTERS IN BOTH AEROBIC AND ANAEROBIC BOTTLES CRITICAL VALUE NOTED.  VALUE IS CONSISTENT WITH PREVIOUSLY REPORTED AND CALLED VALUE.    Culture   Final    NO GROWTH < 24 HOURS Performed at [facility], [place] [address] [place], [place] [address]    Report Status PENDING  Incomplete    Radiology Reports DG Lumbar Spine Complete  Result Date: 02/03/2020 CLINICAL DATA:  Low back pain beginning yesterday evening with bilateral leg weakness. No injury. EXAM: LUMBAR SPINE - COMPLETE 4+ VIEW COMPARISON:  Abdomen and pelvis CT, 02/26/2010. FINDINGS: No fracture, bone lesion or spondylolisthesis. Mild loss of disc height at L3-L4, L4-L5 and L5-S1. Significant endplate sclerosis at F5-D3, to a lesser degree at L2-L3. There are large anterior osteophytes at L3-L4 and L2-L3. Facet joints are relatively well preserved. Skeletal structures are diffusely demineralized. Calcifications along a normal caliber abdominal aorta. IMPRESSION: 1. No fracture or acute finding. 2. Degenerative changes as detailed which progressed compared to the prior CT. Electronically Signed   By: Lajean Manes M.D.   On: 02/03/2020 10:23   MR LUMBAR SPINE WO CONTRAST  Result Date: 02/03/2020 CLINICAL DATA:  Low back pain.  Leukocytosis EXAM: MRI LUMBAR SPINE WITHOUT CONTRAST TECHNIQUE: Multiplanar, multisequence MR imaging of the lumbar spine was performed. No intravenous contrast was administered. COMPARISON:  X-ray 02/03/2020.  CT 10/27/2009 FINDINGS: Segmentation:  Standard. Alignment:  Physiologic. Vertebrae: Extensive fluid signal within the L3-4 disc space with lobulated fluid collection extending into the paravertebral soft tissues measuring approximately 9.2 x 6.0 cm trans axially (series 9, image 19). Components of the fluid collection extend into the bilateral psoas muscles, left greater than right (series 9,  image 21). There is a small amount of fluid signal within the anterior aspect of the L2-3 disc space which is contiguous with the previously described fluid collection anterior to L3-4 (series 6, images 10-12). There are endplate irregularities within the L3 and L4 vertebral bodies centered at the L3-4 disc space with extensive bone marrow edema. There are also mild endplate irregularity at the anterior aspect of the L2-3 disc space with associated marrow edema. Conus medullaris and cauda equina: Conus extends to the L1 level. Conus and cauda equina appear normal. No epidural fluid collection is identified. Paraspinal and other soft tissues: Prevertebral fluid collection, as described above. Intramuscular edema within the bilateral psoas muscles. Posterior paraspinal muscle atrophy. Cholelithiasis. Disc levels: T12-L1: Sagittal sequences only.  Unremarkable. L1-L2: No disc protrusion. Bony overgrowth in the region of the left foramen results in mild foraminal narrowing without evidence of neural impingement. No canal stenosis. L2-L3: No disc protrusion. Mild bilateral facet hypertrophy and ligamentum flavum buckling. No foraminal or canal stenosis. L3-L4: Disc height loss with small posterior disc osteophyte complex. Bilateral facet arthropathy and ligamentum flavum hypertrophy. Findings result in moderate to severe left and mild right foraminal stenosis. Mild bilateral subarticular recess stenosis without canal stenosis. L4-L5: Shallow central disc protrusion. Mild bilateral facet hypertrophy. Extraforaminal osteophytic ridging is seen, left greater than right. No foraminal or canal stenosis. L5-S1: Right paracentral disc osteophyte complex with bilateral facet hypertrophy. Findings result in moderate right subarticular recess stenosis with impingement of the descending right S1 nerve root (series 9, image 33). There is mild-to-moderate  bilateral foraminal stenosis. No canal stenosis. IMPRESSION: 1. Findings of  discitis-osteomyelitis centered at the L3-4 level. There is also osteomyelitis-discitis at the anterior aspect of the L2-3 level. No evidence of epidural fluid collection or abscess. 2. Complex prevertebral fluid collection centered anterior to the L3-4 level measuring up to 9.2 cm. Fluid collection extends into the bilateral psoas muscles, both of which are edematous. 3. Multilevel degenerative changes of the lumbar spine, as above. Moderate right subarticular recess stenosis at L5-S1 with impingement of the descending right S1 nerve root. 4. Moderate-to-severe left and mild right foraminal stenosis at L3-L4. 5. Cholelithiasis. These results were called by telephone at the time of interpretation on 02/03/2020 at 2:37 pm to provider [facility] , who verbally acknowledged these results. Electronically Signed   By: Davina Poke D.O.   On: 02/03/2020 14:39   VAS [place] LOWER EXTREMITY VENOUS (DVT) ([facility] and [facility] 7a-7p)  Result Date: 02/03/2020  Lower Venous DVTStudy Indications: Edema.  Comparison Study: No prior study on file Performing Technologist: Sharion Dove RVS  Examination Guidelines: A complete evaluation includes B-mode imaging, spectral Doppler, color Doppler, and power Doppler as needed of all accessible portions of each vessel. Bilateral testing is considered an integral part of a complete examination. Limited examinations for reoccurring indications may be performed as noted. The reflux portion of the exam is performed with the patient in reverse Trendelenburg.  +-----+---------------+---------+-----------+----------+--------------+ RIGHTCompressibilityPhasicitySpontaneityPropertiesThrombus Aging +-----+---------------+---------+-----------+----------+--------------+ CFV  Full           Yes      Yes                                 +-----+---------------+---------+-----------+----------+--------------+   +---------+---------------+---------+-----------+----------+--------------+ LEFT      CompressibilityPhasicitySpontaneityPropertiesThrombus Aging +---------+---------------+---------+-----------+----------+--------------+ CFV      Full           Yes      Yes                                 +---------+---------------+---------+-----------+----------+--------------+ SFJ      Full                                                        +---------+---------------+---------+-----------+----------+--------------+ FV Prox  Full                                                        +---------+---------------+---------+-----------+----------+--------------+ FV Mid   Full                                                        +---------+---------------+---------+-----------+----------+--------------+ FV DistalFull                                                        +---------+---------------+---------+-----------+----------+--------------+  PFV      Full                                                        +---------+---------------+---------+-----------+----------+--------------+ POP      Full           Yes      Yes                                 +---------+---------------+---------+-----------+----------+--------------+ PTV      Partial                                      Acute          +---------+---------------+---------+-----------+----------+--------------+ PERO     Partial                                      Acute          +---------+---------------+---------+-----------+----------+--------------+     Summary: RIGHT: - No evidence of common femoral vein obstruction.  LEFT: - Findings consistent with acute deep vein thrombosis involving the left posterior tibial veins, and left peroneal veins.  *See table(s) above for measurements and observations. Electronically signed by Deitra Mayo MD on 02/03/2020 at 4:02:00 PM.    Final     SIGNED: Deatra James, MD, FACP, FHM. [facility],  Pager (please use [email or web address]  to page/text)  If 7PM-7AM, please contact night-coverage [email or web address] 02/04/2020, 2:28 PM

## 2020-02-04 NOTE — Plan of Care (Signed)

## 2020-02-05 ENCOUNTER — Encounter (HOSPITAL_COMMUNITY): Admission: EM | Disposition: E | Payer: Self-pay | Source: Home / Self Care | Attending: Internal Medicine

## 2020-02-05 ENCOUNTER — Inpatient Hospital Stay (HOSPITAL_COMMUNITY): Payer: Medicare Other | Admitting: Certified Registered Nurse Anesthetist

## 2020-02-05 ENCOUNTER — Inpatient Hospital Stay (HOSPITAL_COMMUNITY): Payer: Medicare Other

## 2020-02-05 ENCOUNTER — Encounter (HOSPITAL_COMMUNITY): Payer: Self-pay | Admitting: Internal Medicine

## 2020-02-05 ENCOUNTER — Inpatient Hospital Stay (HOSPITAL_COMMUNITY)
Admit: 2020-02-05 | Discharge: 2020-02-05 | Disposition: A | Discharge status: Home / Self Care | Payer: Medicare Other | Attending: Physician Assistant | Admitting: Physician Assistant

## 2020-02-05 DIAGNOSIS — I340 Nonrheumatic mitral (valve) insufficiency: Secondary | ICD-10-CM

## 2020-02-05 DIAGNOSIS — R7881 Bacteremia: Secondary | ICD-10-CM | POA: Diagnosis not present

## 2020-02-05 DIAGNOSIS — G062 Extradural and subdural abscess, unspecified: Secondary | ICD-10-CM | POA: Diagnosis not present

## 2020-02-05 HISTORY — PX: IR LUMBAR DISC ASPIRATION W/IMG GUIDE: IMG5306

## 2020-02-05 HISTORY — PX: TEE WITHOUT CARDIOVERSION: SHX5443

## 2020-02-05 LAB — COMPREHENSIVE METABOLIC PANEL
ALT: 12 U/L (ref 0–44)
AST: 16 U/L (ref 15–41)
Albumin: 2 g/dL — ABNORMAL LOW (ref 3.5–5.0)
Alkaline Phosphatase: 68 U/L (ref 38–126)
Anion gap: 10 (ref 5–15)
BUN: 62 mg/dL — ABNORMAL HIGH (ref 8–23)
CO2: 21 mmol/L — ABNORMAL LOW (ref 22–32)
Calcium: 8.3 mg/dL — ABNORMAL LOW (ref 8.9–10.3)
Chloride: 105 mmol/L (ref 98–111)
Creatinine, Ser: 2.59 mg/dL — ABNORMAL HIGH (ref 0.61–1.24)
GFR calc Af Amer: 24 mL/min — ABNORMAL LOW (ref >60)
GFR calc non Af Amer: 21 mL/min — ABNORMAL LOW (ref >60)
Glucose, Bld: 97 mg/dL (ref 70–99)
Potassium: 3.8 mmol/L (ref 3.5–5.1)
Sodium: 136 mmol/L (ref 135–145)
Total Bilirubin: 0.9 mg/dL (ref 0.3–1.2)
Total Protein: 5.3 g/dL — ABNORMAL LOW (ref 6.5–8.1)

## 2020-02-05 LAB — CBC
HCT: 33.6 % — ABNORMAL LOW (ref 39.0–52.0)
Hemoglobin: 10.3 g/dL — ABNORMAL LOW (ref 13.0–17.0)
MCH: 33 pg (ref 26.0–34.0)
MCHC: 30.7 g/dL (ref 30.0–36.0)
MCV: 107.7 fL — ABNORMAL HIGH (ref 80.0–100.0)
Platelets: 149 10*3/uL — ABNORMAL LOW (ref 150–400)
RBC: 3.12 MIL/uL — ABNORMAL LOW (ref 4.22–5.81)
RDW: 15.2 % (ref 11.5–15.5)
WBC: 10.4 10*3/uL (ref 4.0–10.5)
nRBC: 0 % (ref 0.0–0.2)

## 2020-02-05 LAB — HEPARIN LEVEL (UNFRACTIONATED): Heparin Unfractionated: 0.4 IU/mL (ref 0.30–0.70)

## 2020-02-05 LAB — GLUCOSE, CAPILLARY
Glucose-Capillary: 101 mg/dL — ABNORMAL HIGH (ref 70–99)
Glucose-Capillary: 101 mg/dL — ABNORMAL HIGH (ref 70–99)
Glucose-Capillary: 103 mg/dL — ABNORMAL HIGH (ref 70–99)

## 2020-02-05 SURGERY — ECHOCARDIOGRAM, TRANSESOPHAGEAL
Anesthesia: Monitor Anesthesia Care

## 2020-02-05 MED ORDER — MIDAZOLAM HCL 2 MG/2ML IJ SOLN
INTRAMUSCULAR | Status: AC | PRN
  Administered 2020-02-05: 0.5 mg via INTRAVENOUS

## 2020-02-05 MED ORDER — BUPIVACAINE HCL (PF) 0.5 % IJ SOLN
INTRAMUSCULAR | Status: AC
  Filled 2020-02-05: qty 30

## 2020-02-05 MED ORDER — FUROSEMIDE 40 MG PO TABS
40.0000 mg | ORAL_TABLET | Freq: Every day | ORAL | Status: DC
  Administered 2020-02-06 – 2020-02-08 (×3): 40 mg via ORAL
  Filled 2020-02-05 (×3): qty 1

## 2020-02-05 MED ORDER — FENTANYL CITRATE (PF) 100 MCG/2ML IJ SOLN
INTRAMUSCULAR | Status: AC | PRN
  Administered 2020-02-05: 50 ug via INTRAVENOUS

## 2020-02-05 MED ORDER — MIDAZOLAM HCL 2 MG/2ML IJ SOLN
INTRAMUSCULAR | Status: AC
  Filled 2020-02-05: qty 2

## 2020-02-05 MED ORDER — PROPOFOL 10 MG/ML IV BOLUS
INTRAVENOUS | Status: DC | PRN
  Administered 2020-02-05: 20 mg via INTRAVENOUS
  Administered 2020-02-05: 10 mg via INTRAVENOUS

## 2020-02-05 MED ORDER — SODIUM CHLORIDE 0.9 % IV SOLN: INTRAVENOUS | Status: DC

## 2020-02-05 MED ORDER — PROPOFOL 500 MG/50ML IV EMUL
INTRAVENOUS | Status: DC | PRN
  Administered 2020-02-05: 50 ug/kg/min via INTRAVENOUS

## 2020-02-05 MED ORDER — LIDOCAINE 2% (20 MG/ML) 5 ML SYRINGE
INTRAMUSCULAR | Status: DC | PRN
  Administered 2020-02-05: 50 mg via INTRAVENOUS

## 2020-02-05 MED ORDER — BUTAMBEN-TETRACAINE-BENZOCAINE 2-2-14 % EX AERO
INHALATION_SPRAY | CUTANEOUS | Status: DC | PRN
  Administered 2020-02-05: 2 via TOPICAL

## 2020-02-05 MED ORDER — PHENYLEPHRINE 40 MCG/ML (10ML) SYRINGE FOR IV PUSH (FOR BLOOD PRESSURE SUPPORT)
PREFILLED_SYRINGE | INTRAVENOUS | Status: DC | PRN
  Administered 2020-02-05: 160 ug via INTRAVENOUS
  Administered 2020-02-05: 120 ug via INTRAVENOUS

## 2020-02-05 MED ORDER — LIDOCAINE HCL 1 % IJ SOLN
INTRAMUSCULAR | Status: AC
  Filled 2020-02-05: qty 20

## 2020-02-05 MED ORDER — FENTANYL CITRATE (PF) 100 MCG/2ML IJ SOLN
INTRAMUSCULAR | Status: AC
  Filled 2020-02-05: qty 2

## 2020-02-05 NOTE — Anesthesia Postprocedure Evaluation (Signed)
Anesthesia Post Note  Patient: KEELON ZURN  Procedure(s) Performed: TRANSESOPHAGEAL ECHOCARDIOGRAM (TEE) (N/A )     Patient location during evaluation: Endoscopy Anesthesia Type: MAC Level of consciousness: awake and alert Pain management: pain level controlled Vital Signs Assessment: post-procedure vital signs reviewed and stable Respiratory status: spontaneous breathing, nonlabored ventilation and respiratory function stable Cardiovascular status: blood pressure returned to baseline and stable Postop Assessment: no apparent nausea or vomiting Anesthetic complications: no   No complications documented.  Last Vitals:  Vitals:   02/14/2020 0852 02/08/2020 0901  BP: (!) 104/32 (!) 125/31  Pulse:    Resp: (!) 24   Temp: 37.2 C   SpO2:      Last Pain:  Vitals:   02/11/2020 0901  TempSrc:   PainSc: 0-No pain                 Lidia Collum

## 2020-02-05 NOTE — Progress Notes (Signed)
  Echocardiogram Echocardiogram Transesophageal, color, and spectral  has been performed.  Darlina Sicilian M 02/22/2020, 9:01 AM

## 2020-02-05 NOTE — Progress Notes (Signed)
PROGRESS NOTE    Patient: [name]                            PCP: Haywood Pao, MD                    DOB: 06-10-1928            DOA: 01/08/2020 NOB:[id number]             DOS: 02/20/2020, 1:47 PM   LOS: 2 days   Date of Service: The patient was seen and examined on 02/15/2020  Subjective:   The patient was seen and examined this morning, awake alert oriented no acute distress. T-max 103.1 overnight this morning 98.8, hemodynamically stable No further issues overnight -status post IV fluid resuscitation 1 L lactated Ringer tolerated well  Status post TEE this morning-tolerated well Status post L3-4 fluid drainage -reporting 13 cc of thick blood-tinged fluid  Tolerated both procedures well  Currently stable, in no acute distress  Son present at bedside.  Brief Narrative:  Per HPI: [name] is an 86 y.o. male with HTN, DM 2 who was in his usual state of health until 2:00 PM 02/01/2020 when he noted sudden onset of back pain associated with lower extremity weakness.    Patient reports of generalized weakness, probably due to his low iron, he gets IV iron with his nephrologist twice a month.  Patient subsequently had acute lower extremity weakness, and back pain unable to ambulate  ED evaluation: He underwent Doppler study which did show an acute DVT.  Patient underwent an MRI L-spine which found osteomyelitis/discitis at the L2-3 level with an epidural abscess measuring 9.2 cm extending down into the bilateral psoas muscles.   Apparently patient was seen by neurosurgery in the ED but no notes can be found Apparently gave oral recommendation to IR aspiration, blood cultures  02/04/2020 -patient hemodynamically stable exception of a T-max of 101.4, 3 out of 4 blood culture positive for staph.   -Patient was not on any antibiotics, no notes from consultants No consultation to IR or IV was placed. Neurosurgery/infectious disease, IR, pharmacy were all consulted. Case  discussed with cardiology regarding TEE Patient status was discussed in detail with patient and his son at bedside.  03/03/2020 -T-max 103.1 overnight.. Otherwise hemodynamically stable Status post TEE this morning-tolerated well -reported no obvious agitation Status post L3-4 fluid drainage -reporting 13 cc of thick blood-tinged fluid   Assessment & Plan:   Principal Problem:   Epidural abscess Active Problems:   Diabetes ([place])   Anemia of chronic disease   Hypertension   Pressure injury of skin   Possible epidural abscess fluid collection/ Discitis / Osteomyelitis L3-4  -MRI of spine was reviewed 9 cm epidural fluid was identified extending into bilateral psoas -.T-max 103.1 overnight.. Otherwise stable -02/24/2020 status post CT-guided aspiration of  L3-4 fluid drainage -reporting 13 cc of thick blood-tinged fluid   Neurosurgery/ Infectious Disease, IR, pharmacy were all consulted.   -Neuro surgery believe that this is not epidural abscess, recommending IR for drain,  Paraspinal fluid cultures and cytology >> pending  -Infectious disease consulted -broad-spectrum antibiotic initiated  Bacteremia-MSSA - 3/4 blood cultures are growing staph) -Initiating broad-spectrum antibiotics cefazolin 2 g every 12 hours -02/07/2020 echocardiogram/TEE completed, reporting no obvious vegetation, no signs of endocarditis  Acute DVT Patient started on IV heparin in ED --resuming heparin drip -Once stable, anticipating transition  patient to oral anticoagulant  CKDIIIb Patient has known chronic kidney disease with GFR of 20, creatinine is at baseline. -We will continue monitor BUN/creatinine -Creatinine 2.76 >>  2.51 >> 2.59  HTN Continue diltiazem  -Holding Lasix  DM 2 Hold Januvia CBG with very low SSI coverage given CKD. Will be important to have good blood sugar control to eradicate infection however he is also at high risk for hypoglycemia.   Patient is DNR Discussed with  patient, and his son at bedside he is clear that he would not like to be resuscitated should his heart stop.   Stage II pressure ulcer (POA)/right first toe, ball of the toe-nondraining Continue wound care Monitoring closely, no signs of infection erythema edema or drainage  Nutritional status:          Cultures; Blood Cultures x 2 >> NGT   Antimicrobials: 8/2/2021cefazolin 2 g every 12 hours    Consultants:  -Neurosurgery -Infectious disease -Interventional radiology -Pharmacy     ------------------------------------------------------------------------------------------------------------------------------------ DVT prophylaxis:  On heparin drip  Code Status:   Code Status: DNR Family Communication: Patient's son present at bedside discussed with the patient and her son updated with details -expressed understanding agree with the current plan The above findings and plan of care has been discussed with patient (patient's son)  in detail,  they expressed understanding and agreement of above. -Advance care planning has been discussed.   Admission status:    Status is: Inpatient  Remains inpatient appropriate because:Inpatient level of care appropriate due to severity of illness   Dispo: The patient is from: Home              Anticipated d/c is to: Home              Anticipated d/c date is: > 3 days              Patient currently is not medically stable to d/c.        Procedures:   02/21/2020 anticipating CT-guided IR drainage paraspinal fluid 13 cc of blood-tinged fluid was extracted pending culture sensitivity 02/22/2020 TEE -reported negative for any vegetation, or signs of endocarditis    Antimicrobials:  Anti-infectives (From admission, onward)   Start     Dose/Rate Route Frequency Ordered Stop   02/04/20 1000  ceFAZolin (ANCEF) IVPB 2g/100 mL premix     Discontinue     2 g 200 mL/hr over 30 Minutes Intravenous Every 12 hours 02/04/20 0903          Medication:  . bupivacaine      . diltiazem  240 mg Oral Daily  . fentaNYL      . [START ON 02/06/2020] furosemide  40 mg Oral Daily  . lactose free nutrition  237 mL Oral Daily  . lidocaine      . midazolam      . pravastatin  40 mg Oral Daily    acetaminophen **OR** acetaminophen, bisacodyl, HYDROmorphone (DILAUDID) injection, oxyCODONE, polyethylene glycol   Objective:   Vitals:   02/16/2020 1115 03/02/2020 1120 02/08/2020 1135 02/03/2020 1152  BP: (!) 150/58 (!) 150/62 (!) 151/55 (!) 151/54  Pulse: 81 82 81 80  Resp: _0 Temp:    98.8 F (37.1 C)  TempSrc:    Oral  SpO2: 97% 99% 93% 93%  Weight:      Height:        Intake/Output Summary (Last 24 hours) at 02/12/2020 1347 Last data filed at 02/24/2020 1156  Gross per 24 hour  Intake 3472.83 ml  Output 1075 ml  Net 2397.83 ml   Filed Weights   02/03/20 1426  Weight: 88.5 kg     Examination:    Physical Exam:   General:  Alert, oriented, cooperative, no distress;   HEENT:  Normocephalic, PERRL, otherwise with in Normal limits   Neuro:  CNII-XII intact. , normal motor and sensation, reflexes intact   Lungs:   Clear to auscultation BL, Respirations unlabored, no wheezes / crackles  Cardio:    S1/S2, RRR, No murmure, No Rubs or Gallops   Abdomen:   Soft, non-tender, bowel sounds active all four quadrants,  no guarding or peritoneal signs.  Muscular skeletal:  Limited exam - in bed, able to move all 4 extremities, Normal strength,  2+ pulses,  symmetric, No pitting edema  Skin:  Dry, warm to touch, negative for any Rashes,   Wounds: Please see nursing documentation Pressure Injury 02/03/20 Buttocks Right Stage 2 -  Partial thickness loss of dermis presenting as a shallow open injury with a red, pink wound bed without slough. (Active)  02/03/20 2357  Location: Buttocks  Location Orientation: Right  Staging: Stage 2 -  Partial thickness loss of dermis presenting as a shallow open injury with a red, pink wound  bed without slough.  Wound Description (Comments):   Present on Admission: Yes     Pressure Injury 02/04/20 Buttocks Left Stage 1 -  Intact skin with non-blanchable redness of a localized area usually over a bony prominence. (Active)  02/04/20   Location: Buttocks  Location Orientation: Left  Staging: Stage 1 -  Intact skin with non-blanchable redness of a localized area usually over a bony prominence.  Wound Description (Comments):   Present on Admission: Yes       -----------------------------------------------------------------------------------------------------------------------------------    LABs:  CBC Latest Ref Rng & Units 02/04/2020 02/04/2020 01/13/2020  WBC 4.0 - 10.5 K/uL 10.4 14.6(H) 11.8(H)  Hemoglobin 13.0 - 17.0 g/dL 10.3(L) 11.5(L) 12.0(L)  Hematocrit 39 - 52 % 33.6(L) 36.4(L) 39.2  Platelets 150 - 400 K/uL 149(L) 156 149(L)   CMP Latest Ref Rng & Units 03/04/2020 02/04/2020 01/20/2020  Glucose 70 - 99 mg/dL 97 131(H) 211(H)  BUN 8 - 23 mg/dL 62(H) 58(H) 55(H)  Creatinine 0.61 - 1.24 mg/dL 2.59(H) 2.51(H) 2.76(H)  Sodium 135 - 145 mmol/L 136 138 141  Potassium 3.5 - 5.1 mmol/L 3.8 5.0 4.3  Chloride 98 - 111 mmol/L 105 104 104  CO2 22 - 32 mmol/L 21(L) 26 24  Calcium 8.9 - 10.3 mg/dL 8.3(L) 8.9 9.4  Total Protein 6.5 - 8.1 g/dL 5.3(L) - 6.9  Total Bilirubin 0.3 - 1.2 mg/dL 0.9 - 0.4  Alkaline Phos 38 - 126 U/L 68 - 102  AST 15 - 41 U/L 16 - 13(L)  ALT 0 - 44 U/L 12 - 14       Micro Results Recent Results (from the past 240 hour(s))  SARS Coronavirus 2 by RT PCR (hospital order, performed in [facility] hospital lab) Nasopharyngeal Nasopharyngeal Swab     Status: None   Collection Time: 02/03/20  2:27 PM   Specimen: Nasopharyngeal Swab  Result Value Ref Range Status   SARS Coronavirus 2 NEGATIVE NEGATIVE Final    Comment: (NOTE) SARS-CoV-2 target nucleic acids are NOT DETECTED.  The SARS-CoV-2 RNA is generally detectable in upper and lower respiratory  specimens during the acute phase of infection. The lowest concentration of SARS-CoV-2 viral copies this assay can  detect is 250 copies / mL. A negative result does not preclude SARS-CoV-2 infection and should not be used as the sole basis for treatment or other patient management decisions.  A negative result may occur with improper specimen collection / handling, submission of specimen other than nasopharyngeal swab, presence of viral mutation(s) within the areas targeted by this assay, and inadequate number of viral copies (<250 copies / mL). A negative result must be combined with clinical observations, patient history, and epidemiological information.  Fact Sheet for Patients:   StrictlyIdeas.no  Fact Sheet for Healthcare Providers: BankingDealers.co.za  This test is not yet approved or  cleared by the [place] FDA and has been authorized for detection and/or diagnosis of SARS-CoV-2 by FDA under an Emergency Use Authorization (EUA).  This EUA will remain in effect (meaning this test can be used) for the duration of the COVID-19 declaration under Section 564(b)(1) of the Act, 21 U.S.C. section 360bbb-3(b)(1), unless the authorization is terminated or revoked sooner.  Performed at [facility], [place] [address], [place], [place] [address]   Blood culture (routine x 2)     Status: Abnormal (Preliminary result)   Collection Time: 02/03/20  4:41 PM   Specimen: BLOOD LEFT HAND  Result Value Ref Range Status   Specimen Description BLOOD LEFT HAND  Final   Special Requests   Final    BOTTLES DRAWN AEROBIC AND ANAEROBIC Blood Culture adequate volume   Culture  Setup Time   Final    GRAM POSITIVE COCCI IN CLUSTERS IN BOTH AEROBIC AND ANAEROBIC BOTTLES Organism ID to follow CRITICAL RESULT CALLED TO, READ BACK BY AND VERIFIED WITH: A. Rogers Blocker PharmD 8:50 02/04/20 ([email or web address]) Performed at [facility], [place] [address],  [place], [place] [address]    Culture STAPHYLOCOCCUS AUREUS (A)  Final   Report Status PENDING  Incomplete  Blood Culture ID Panel (Reflexed)     Status: Abnormal   Collection Time: 02/03/20  4:41 PM  Result Value Ref Range Status   Enterococcus species NOT DETECTED NOT DETECTED Final   Listeria monocytogenes NOT DETECTED NOT DETECTED Final   Staphylococcus species DETECTED (A) NOT DETECTED Final    Comment: CRITICAL RESULT CALLED TO, READ BACK BY AND VERIFIED WITH: A. Rogers Blocker PharmD 8:50 02/04/20 ([email or web address])    Staphylococcus aureus (BCID) DETECTED (A) NOT DETECTED Final    Comment: Methicillin (oxacillin) susceptible Staphylococcus aureus (MSSA). Preferred therapy is anti staphylococcal beta lactam antibiotic (Cefazolin or Nafcillin), unless clinically contraindicated. CRITICAL RESULT CALLED TO, READ BACK BY AND VERIFIED WITH: A. Rogers Blocker PharmD 8:50 02/04/20 ([email or web address])    Methicillin resistance NOT DETECTED NOT DETECTED Final   Streptococcus species NOT DETECTED NOT DETECTED Final   Streptococcus agalactiae NOT DETECTED NOT DETECTED Final   Streptococcus pneumoniae NOT DETECTED NOT DETECTED Final   Streptococcus pyogenes NOT DETECTED NOT DETECTED Final   Acinetobacter baumannii NOT DETECTED NOT DETECTED Final   Enterobacteriaceae species NOT DETECTED NOT DETECTED Final   Enterobacter cloacae complex NOT DETECTED NOT DETECTED Final   Escherichia coli NOT DETECTED NOT DETECTED Final   Klebsiella oxytoca NOT DETECTED NOT DETECTED Final   Klebsiella pneumoniae NOT DETECTED NOT DETECTED Final   Proteus species NOT DETECTED NOT DETECTED Final   Serratia marcescens NOT DETECTED NOT DETECTED Final   Haemophilus influenzae NOT DETECTED NOT DETECTED Final   Neisseria meningitidis NOT DETECTED NOT DETECTED Final   Pseudomonas aeruginosa NOT DETECTED NOT DETECTED Final   Candida albicans NOT DETECTED NOT DETECTED Final  Candida glabrata NOT DETECTED NOT DETECTED Final   Candida krusei NOT DETECTED NOT  DETECTED Final   Candida parapsilosis NOT DETECTED NOT DETECTED Final   Candida tropicalis NOT DETECTED NOT DETECTED Final    Comment: Performed at [facility], [place] [address] [place], [place] [address]  Blood culture (routine x 2)     Status: Abnormal (Preliminary result)   Collection Time: 02/03/20  4:45 PM   Specimen: BLOOD RIGHT HAND  Result Value Ref Range Status   Specimen Description BLOOD RIGHT HAND  Final   Special Requests   Final    BOTTLES DRAWN AEROBIC AND ANAEROBIC Blood Culture adequate volume   Culture  Setup Time   Final    GRAM POSITIVE COCCI IN CLUSTERS IN BOTH AEROBIC AND ANAEROBIC BOTTLES CRITICAL VALUE NOTED.  VALUE IS CONSISTENT WITH PREVIOUSLY REPORTED AND CALLED VALUE. Performed at [facility], [place] [address] [place], [place] [address]    Culture STAPHYLOCOCCUS AUREUS (A)  Final   Report Status PENDING  Incomplete    Radiology Reports DG Lumbar Spine Complete  Result Date: 02/03/2020 CLINICAL DATA:  Low back pain beginning yesterday evening with bilateral leg weakness. No injury. EXAM: LUMBAR SPINE - COMPLETE 4+ VIEW COMPARISON:  Abdomen and pelvis CT, 02/26/2010. FINDINGS: No fracture, bone lesion or spondylolisthesis. Mild loss of disc height at L3-L4, L4-L5 and L5-S1. Significant endplate sclerosis at W1-U9, to a lesser degree at L2-L3. There are large anterior osteophytes at L3-L4 and L2-L3. Facet joints are relatively well preserved. Skeletal structures are diffusely demineralized. Calcifications along a normal caliber abdominal aorta. IMPRESSION: 1. No fracture or acute finding. 2. Degenerative changes as detailed which progressed compared to the prior CT. Electronically Signed   By: Lajean Manes M.D.   On: 02/03/2020 10:23   MR LUMBAR SPINE WO CONTRAST   Result Date: 02/03/2020 CLINICAL DATA:  Low back pain.  Leukocytosis EXAM: MRI LUMBAR SPINE WITHOUT CONTRAST TECHNIQUE: Multiplanar, multisequence MR imaging of the lumbar spine was  performed. No intravenous contrast was administered. COMPARISON:  X-ray 02/03/2020.  CT 10/27/2009 FINDINGS: Segmentation:  Standard. Alignment:  Physiologic. Vertebrae: Extensive fluid signal within the L3-4 disc space with lobulated fluid collection extending into the paravertebral soft tissues measuring approximately 9.2 x 6.0 cm trans axially (series 9, image 19). Components of the fluid collection extend into the bilateral psoas muscles, left greater than right (series 9, image 21). There is a small amount of fluid signal within the anterior aspect of the L2-3 disc space which is contiguous with the previously described fluid collection anterior to L3-4 (series 6, images 10-12). There are endplate irregularities within the L3 and L4 vertebral bodies centered at the L3-4 disc space with extensive bone marrow edema. There are also mild endplate irregularity at the anterior aspect of the L2-3 disc space with associated marrow edema. Conus medullaris and cauda equina: Conus extends to the L1 level. Conus and cauda equina appear normal. No epidural fluid collection is identified. Paraspinal and other soft tissues: Prevertebral fluid collection, as described above. Intramuscular edema within the bilateral psoas muscles. Posterior paraspinal muscle atrophy. Cholelithiasis. Disc levels: T12-L1: Sagittal sequences only.  Unremarkable. L1-L2: No disc protrusion. Bony overgrowth in the region of the left foramen results in mild foraminal narrowing without evidence of neural impingement. No canal stenosis. L2-L3: No disc protrusion. Mild bilateral facet hypertrophy and ligamentum flavum buckling. No foraminal or canal stenosis. L3-L4: Disc height loss with small posterior disc osteophyte complex. Bilateral facet arthropathy  and ligamentum flavum hypertrophy. Findings result in moderate to severe left and mild right foraminal stenosis. Mild bilateral subarticular recess stenosis without canal stenosis. L4-L5: Shallow  central disc protrusion. Mild bilateral facet hypertrophy. Extraforaminal osteophytic ridging is seen, left greater than right. No foraminal or canal stenosis. L5-S1: Right paracentral disc osteophyte complex with bilateral facet hypertrophy. Findings result in moderate right subarticular recess stenosis with impingement of the descending right S1 nerve root (series 9, image 33). There is mild-to-moderate bilateral foraminal stenosis. No canal stenosis.  IMPRESSION: 1. Findings of discitis-osteomyelitis centered at the L3-4 level. There is also osteomyelitis-discitis at the anterior aspect of the L2-3 level. No evidence of epidural fluid collection or abscess. 2. Complex prevertebral fluid collection centered anterior to the L3-4 level measuring up to 9.2 cm. Fluid collection extends into the bilateral psoas muscles, both of which are edematous. 3. Multilevel degenerative changes of the lumbar spine, as above. Moderate right subarticular recess stenosis at L5-S1 with impingement of the descending right S1 nerve root. 4. Moderate-to-severe left and mild right foraminal stenosis at L3-L4. 5. Cholelithiasis. These results were called by telephone at the time of interpretation on 02/03/2020 at 2:37 pm to provider [facility] , who verbally acknowledged these results. Electronically Signed   By: Davina Poke D.O.   On: 02/03/2020 14:39   DG CHEST PORT 1 VIEW  Result Date: 02/22/2020 CLINICAL DATA:  Postop EXAM: PORTABLE CHEST 1 VIEW COMPARISON:  None. FINDINGS: The cardiomediastinal silhouette is enlarged in contour. Likely small bilateral pleural effusions. No pneumothorax. Atherosclerotic calcifications of the aorta. Peribronchial cuffing. Hilar fullness and peripheral interstitial markings. Questionable nodule versus bone island of the RIGHT apex. Visualized abdomen is unremarkable. No acute osseous abnormality noted. IMPRESSION: 1. Findings suggestive of pulmonary edema with small bilateral pleural effusions. 2.  Questionable nodule versus bone island of the RIGHT apex. Recommend follow-up PA and lateral radiograph. Electronically Signed   By: Valentino Saxon MD   On: 02/14/2020 09:39   ECHOCARDIOGRAM COMPLETE  Result Date: 02/04/2020    ECHOCARDIOGRAM REPORT   Patient Name:   [name] Date of Exam: 02/04/2020 Medical Rec #:  [id number]         Height:       73.0 in Accession #:    [id number]        Weight:       195.0 lb Date of Birth:  [birth year]          BSA:          2.129 m Patient Age:    18 years          BP:           125/48 mmHg Patient Gender: M                 HR:           83 bpm. Exam Location:  Inpatient Procedure: 2D Echo, Cardiac Doppler and Color Doppler Indications:    Endocarditis I38  History:        Patient has no prior history of Echocardiogram examinations.                 Risk Factors:Hypertension and Diabetes.  Sonographer:    Jonelle Sidle Dance Referring Phys: [phone] Philomena Buttermore A Catori Panozzo  Sonographer Comments: Suboptimal parasternal window.   IMPRESSIONS  1. Left ventricular ejection fraction, by estimation, is 55 to 60%. The left ventricle has normal function. The left ventricle has no regional wall motion abnormalities. Left  ventricular diastolic parameters are consistent with Grade I diastolic dysfunction (impaired relaxation).  2. Right ventricular systolic function is normal. The right ventricular size is normal. Tricuspid regurgitation signal is inadequate for assessing PA pressure.  3. Left atrial size was mildly dilated.  4. The mitral valve is degenerative. Trivial mitral valve regurgitation. No evidence of mitral stenosis.  5. The aortic valve is abnormal. Aortic valve regurgitation is not visualized. Mild aortic valve stenosis. Aortic valve mean gradient measures 17.0 mmHg.  6. The inferior vena cava is normal in size with greater than 50% respiratory variability, suggesting right atrial pressure of 3 mmHg. Conclusion(s)/Recommendation(s): No evidence of valvular vegetations on this  transthoracic echocardiogram. Would recommend a transesophageal echocardiogram to exclude infective endocarditis if clinically indicated. FINDINGS  Left Ventricle: Left ventricular ejection fraction, by estimation, is 55 to 60%. The left ventricle has normal function. The left ventricle has no regional wall motion abnormalities. The left ventricular internal cavity size was normal in size. There is  no left ventricular hypertrophy. Left ventricular diastolic parameters are consistent with Grade I diastolic dysfunction (impaired relaxation). Right Ventricle: The right ventricular size is normal. No increase in right ventricular wall thickness. Right ventricular systolic function is normal. Tricuspid regurgitation signal is inadequate for assessing PA pressure. Left Atrium: Left atrial size was mildly dilated. Right Atrium: Right atrial size was normal in size. Pericardium: There is no evidence of pericardial effusion. Mitral Valve: The mitral valve is degenerative in appearance. Mild to moderate mitral annular calcification. Trivial mitral valve regurgitation. No evidence of mitral valve stenosis. Tricuspid Valve: The tricuspid valve is normal in structure. Tricuspid valve regurgitation is trivial. No evidence of tricuspid stenosis. Aortic Valve: The aortic valve is abnormal. Aortic valve regurgitation is not visualized. Mild aortic stenosis is present. There is severe calcifcation of the aortic valve. Aortic valve mean gradient measures 17.0 mmHg. Aortic valve peak gradient measures 25.0 mmHg. Aortic valve area, by VTI measures 1.37 cm. Pulmonic Valve: The pulmonic valve was not well visualized. Pulmonic valve regurgitation is trivial. No evidence of pulmonic stenosis. Aorta: The aortic root was not well visualized. Venous: The inferior vena cava is normal in size with greater than 50% respiratory variability, suggesting right atrial pressure of 3 mmHg. IAS/Shunts: No atrial level shunt detected by color flow  Doppler.  LEFT    Electronically signed by Cherlynn Kaiser MD Signature Date/Time: 02/04/2020/6:38:32 PM    Final    ECHO TEE  Result Date: 03/03/2020    TRANSESOPHOGEAL ECHO REPORT   Patient Name:   [name] Date of Exam: 02/03/2020 Medical Rec #:  [id number]         Height:       73.0 in Accession #:    [id number]        Weight:       195.0 lb Date of Birth:  10/20/1927          BSA:          2.129 m Patient Age:    46 years          BP:           104/32 mmHg Patient Gender: M                 HR:           68 bpm. Exam Location:  Inpatient Procedure: TEE-Intraopertive, Color Doppler and Cardiac Doppler Indications:     Bacteremia 790.7 / R78.81  History:  Patient has prior history of Echocardiogram examinations, most                  recent 02/04/2020. Risk Factors:Hypertension and Diabetes.                  Anemia.  Sonographer:     Darlina Sicilian RDCS Referring Phys:  [id number] HAO MENG Diagnosing Phys: Skeet Latch MD  Sonographer Comments: Transgastic imaging not obtained PROCEDURE: After discussion of the risks and benefits of a TEE, an informed consent was obtained from the patient. The transesophogeal probe was passed without difficulty through the esophogus of the patient. Local oropharyngeal anesthetic was provided with viscous lidocaine. Sedation performed by different physician. The patient was monitored while under deep sedation. Anesthestetic sedation was provided intravenously by Anesthesiology: 160.43m of Propofol. The patient's vital signs; including heart rate, blood pressure, and oxygen saturation; remained stable throughout the procedure. The patient developed no complications during the procedure.   IMPRESSIONS  1. Left ventricular ejection fraction, by estimation, is 55 to 60%. The left ventricle has normal function. The left ventricle has no regional wall motion abnormalities.  2. Right ventricular systolic function is normal. The right ventricular size is normal.   3. Left  atrial size was mildly dilated. No left atrial/left atrial appendage thrombus was detected.   4. There is a tiny (0.5 x 0.3 cm) fixed, hyperechoic density on the posterior mitral valve leaflet, consistent with MAC. There is no evidence of endocarditis. The mitral valve is normal in structure. Mild mitral valve regurgitation. No evidence of mitral stenosis.   5. Aortic valve leaflet motion is restricted. No gradients were obtained because we wer unable to get transgastric images.. The aortic valve is normal in structure. Aortic valve regurgitation is not visualized. No aortic stenosis is present.   6. Descending aorta not well visualized.   7. The inferior vena cava is normal in size with greater than 50% respiratory variability, suggesting right atrial pressure of 3 mmHg. Conclusion(s)/Recommendation(s): No evidence of vegetation/infective endocarditis on this transesophageal echocardiogram.   FINDINGS  Left Ventricle: Left ventricular ejection fraction, by estimation, is 55 to 60%. The left ventricle has normal function. The left ventricle has no regional wall motion abnormalities. The left ventricular internal cavity size was normal in size. There is  no left ventricular hypertrophy. Right Ventricle: The right ventricular size is normal. No increase in right ventricular wall thickness. Right ventricular systolic function is normal. Left Atrium: Left atrial size was mildly dilated. No left atrial/left atrial appendage thrombus was detected. Right Atrium: Right atrial size was normal in size. Pericardium: There is no evidence of pericardial effusion. Mitral Valve: There is a tiny (0.5 x 0.3 cm) fixed, hyperechoic density on the posterior mitral valve leaflet, consistent with MAC. There is no evidence of endocarditis. The mitral valve is normal in structure. There is mild thickening of the mitral valve leaflet(s). Normal mobility of the mitral valve leaflets. Mild mitral annular calcification. Mild mitral valve  regurgitation. No evidence of mitral valve stenosis. Tricuspid Valve: The tricuspid valve is normal in structure. Tricuspid valve regurgitation is not demonstrated. No evidence of tricuspid stenosis. Aortic Valve: Aortic valve leaflet motion is restricted. No gradients were obtained because we wer unable to get transgastric images. The aortic valve is normal in structure.. There is moderate thickening and moderate calcification of the aortic valve. Aortic valve regurgitation is not visualized. No aortic stenosis is present. There is moderate thickening of the aortic valve. There is moderate  calcification of the aortic valve. Pulmonic Valve: The pulmonic valve was normal in structure. Pulmonic valve regurgitation is not visualized. No evidence of pulmonic stenosis. Aorta: Descending aorta not well visualized. The aortic root is normal in size and structure. Venous: The inferior vena cava is normal in size with greater than 50% respiratory variability, suggesting right atrial pressure of 3 mmHg. IAS/Shunts: No atrial level shunt detected by color flow Doppler. Skeet Latch MD Electronically signed by Skeet Latch MD Signature Date/Time: 02/18/2020/11:47:32 AM    Final (Updated)    VAS [place] LOWER EXTREMITY VENOUS (DVT) ([facility] and [facility] 7a-7p)  Result Date: 02/03/2020  Lower Venous DVTStudy Indications: Edema.  Comparison Study: No prior study on file Performing Technologist: Sharion Dove RVS  Examination Guidelines: A complete evaluation includes B-mode imaging, spectral Doppler, color Doppler, and power Doppler as needed of all accessible portions of each vessel. Bilateral testing is considered an integral part of a complete examination. Limited examinations for reoccurring indications may be performed as noted. The reflux portion of the exam is performed with the patient in reverse Trendelenburg.        Summary: RIGHT: - No evidence of common femoral vein obstruction.  LEFT: - Findings consistent with acute deep  vein thrombosis involving the left posterior tibial veins, and left peroneal veins.  *See table(s) above for measurements and observations. Electronically signed by Deitra Mayo MD on 02/03/2020 at 4:02:00 PM.    Final    IR LUMBAR [place] ASPIRATION W/IMG GUIDE  Result Date: 02/25/2020 INDICATION: 86 year old male presenting with sudden onset of back pain and lower extremity weakness for 3 days. MRI of the lumbar spine showed L2-L3 and L3-L4 discitis/osteomyelitis. He comes to our service for a fluoroscopy guided disc aspiration. EXAM: Fluoroscopy guided L3-4 disc aspiration MEDICATIONS: The patient is currently admitted to the hospital and receiving intravenous antibiotics. The antibiotics were administered within an appropriate time frame prior to the initiation of the procedure. ANESTHESIA/SEDATION: Fentanyl 50 mcg IV; Versed 0.5 mg IV Moderate Sedation Time:  15 The patient was continuously monitored during the procedure by the interventional radiology nurse under my direct supervision. COMPLICATIONS: None immediate. PROCEDURE: Informed written consent was obtained from the patient after a thorough discussion of the procedural risks, benefits and alternatives. All questions were addressed. Maximal Sterile Barrier Technique was utilized including caps, mask, sterile gowns, sterile gloves, sterile drape, hand hygiene and skin antiseptic. A timeout was performed prior to the initiation of the procedure. The a hand was made to lie prone in the angiography table. The lower back was prepped and draped in the usual sterile fashion. The L3-4 disc space was identified under fluoroscopy in the frontal and lateral planes. The skin was infiltrated with lidocaine 1% approximately 4.5 cm to the left of midline. Subsequently, an 18 gauge x 15 cm needle was advanced under fluoroscopic guidance into the L3-4 disc space. Then, a 22 gauge x 20 cm spinal needle was advanced through the 18 gauge needle into the disc space.  Attempted aspiration through the 22 gauge needle proved unsuccessful. The needle was removed and the stylet of the 18 gauge needle was introduced. The 18 gauge needle was then advanced into the center of the disc space. The stylet was removed and 13 cc of thick blood tinged fluid was aspirated. The needle was subsequently withdrawn. The skin was seen and dressed with a sterile bandage. The patient tolerated the procedure well without incident or complication.   IMPRESSION: Successful fluoroscopy guided L3-4 disc aspiration.  Electronically Signed   By: Pedro Earls M.D.   On: 02/11/2020 11:56    SIGNED: Deatra James, MD, FACP, FHM. [facility],  Pager (please use [email or web address] to page/text)  If 7PM-7AM, please contact night-coverage [email or web address] 02/06/2020, 1:47 PM

## 2020-02-05 NOTE — Anesthesia Procedure Notes (Signed)
Procedure Name: MAC Date/Time: 02/11/2020 8:18 AM Performed by: Leonor Liv, CRNA Pre-anesthesia Checklist: Patient identified, Suction available, Emergency Drugs available, Timeout performed and Patient being monitored Patient Re-evaluated:Patient Re-evaluated prior to induction Oxygen Delivery Method: Nasal cannula Placement Confirmation: positive ETCO2 Dental Injury: Teeth and Oropharynx as per pre-operative assessment

## 2020-02-05 NOTE — H&P (Signed)
[name] is a 86 y.o. male who has presented today for surgery, with the diagnosis of bacteremia.  The various methods of treatment have been discussed with the patient and family. After consideration of risks, benefits and other options for treatment, the patient has consented to  Procedure(s): TRANSESOPHAGEAL ECHOCARDIOGRAM (TEE) (N/A) as a surgical intervention .  The patient's history has been reviewed, patient examined, no change in status, stable for surgery.  I have reviewed the patient's chart and labs.  Questions were answered to the patient's satisfaction.    Shaasia Odle C. Oval Linsey, MD, [facility]  02/27/2020 8:16 AM

## 2020-02-05 NOTE — CV Procedure (Addendum)
Brief TEE Note  LVEF 55%  Mild mitral annular calcification.  Mitral valve leaflets mildly thickened.  There was a small, fixed, hyperechoic region on the posterior mitral valve annulus.  This region is most consistent with degenerative mitral valve disease rather than endocarditis.  Trivial mitral regurgitation.  Moderate aortic valve thickening and calcification.  Restricted aortic valve motion.  Consistent with aortic stenosis. Unable to grade severity as transgastric views could not be obtained.  No LA/LAA thrombus or masses.  Large hiatal hernia noted.  Unable to obtain transgastric views as there was resistance when the probe was advanced.  Given his history of achalasia requiring esophageal Botox injections, the procedure was terminated.   Descending aorta not well-visualized.  For additional details see full report.  Benedetto Ryder C. Oval Linsey, MD, [facility] 02/16/2020 8:48 AM

## 2020-02-05 NOTE — Progress Notes (Signed)
Patient ID: [name], male   DOB: [birth year], 86 y.o.   MRN: [id number] BP (!) 139/52 (BP Location: Right Arm)   Pulse 79   Temp 98.9 F (37.2 C) (Oral)   Resp 18   Ht 6\' 1"  (1.854 m)   Wt 88.5 kg   SpO2 92%   BMI 25.73 kg/m  There are a number of errors on the chart which will be addressed. The neurosurgical service did not examine the patient in the emergency department. This was first noted on the History and physical dated 02/03/2020. The neurosurgical service did not consult IR because the neurosurgical service is not the primary service. There also is no epidural abscess, which is also noted in the official radiology report for the mri performed on 02/03/2020. The fact that there is no intracanal process and only the spontaneous discitis is the reason there are no primary neurosurgical problems. This remains a medical problem until non operative treatment has failed. There is no operative pathology at this time, and no indications for surgery in this 86 yo gentleman. If he fails antibiotic treatment over time then discussions can be had, but at this time surgery is moot. Recommendations were made to the ED physician and reflected by the ED physicians note. We typically do not consult other services such as IR, or ID when we will not be active participants in the ongoing care of the patient, as is the case here.

## 2020-02-05 NOTE — Transfer of Care (Signed)
Immediate Anesthesia Transfer of Care Note  Patient: [name]  Procedure(s) Performed: TRANSESOPHAGEAL ECHOCARDIOGRAM (TEE) (N/A )  Patient Location: Endoscopy Unit  Anesthesia Type:MAC  Level of Consciousness: awake, alert  and oriented  Airway & Oxygen Therapy: Patient Spontanous Breathing and Patient connected to nasal cannula oxygen  Post-op Assessment: Report given to RN, Post -op Vital signs reviewed and stable and Patient moving all extremities  Post vital signs: Reviewed and stable  Last Vitals:  Vitals Value Taken Time  BP    Temp    Pulse 70 02/14/2020 0852  Resp 27 02/08/2020 0852  SpO2 94 % 03/03/2020 0852  Vitals shown include unvalidated device data.  Last Pain:  Vitals:   02/28/2020 0717  TempSrc: Oral  PainSc: 2       Patients Stated Pain Goal: 1 (50/53/97 6734)  Complications: No complications documented.

## 2020-02-05 NOTE — Progress Notes (Signed)
ANTICOAGULATION CONSULT NOTE   Pharmacy Consult for Heparin Indication: acute DVT 02/03/20  No Known Allergies  Patient Measurements: Height: 6\' 1"  (185.4 cm) Weight: 88.5 kg (195 lb) IBW/kg (Calculated) : 79.9 Heparin Dosing Weight: 88.5 kg  Vital Signs: Temp: 99 F (37.2 C) (08/03 0852) Temp Source: Oral (08/03 0852) BP: 125/31 (08/03 0901) Pulse Rate: 72 (08/03 0717)  Labs: Recent Labs    01/09/2020 2151 01/17/2020 2151 02/04/20 0004 02/04/20 0213 02/17/2020 0558  HGB 12.0*   < >  --  11.5* 10.3*  HCT 39.2  --   --  36.4* 33.6*  PLT 149*  --   --  156 149*  HEPARINUNFRC  --   --  0.48 0.57 0.40  CREATININE 2.76*  --   --  2.51*  --    < > = values in this interval not displayed.    Estimated Creatinine Clearance: 22.5 mL/min (A) (by C-G formula based on SCr of 2.51 mg/dL (H)).   Medical History: Past Medical History:  Diagnosis Date  . Anemia   . Callus   . Diabetes mellitus without complication ([place])   . Gastrointestinal ulcer   . Hypertension     Assessment: 86 YO M on heparin for new LLE DVT 02/03/20 in the setting of MSSA bacteremia/discitis/osteomyelitis. No AC PTA.   HL 0.4 therapeutic. H/H down trending. Discussed with MD, no bleeding, likely dilution from fluids. Plt stable. Planning IR aspiration 8/3, RN will hold heparin 1hr prior to procedure. Will increase rate slightly to maintain in goal.   Goal of Therapy:  Heparin level 0.3-0.7 units/ml Monitor platelets by anticoagulation protocol: Yes   Plan:  Incr heparin 1550 units/hr Monitor daily HL, CBC/plt Monitor for signs/symptoms of bleeding  F/u transition to apixaban vs warfarin when no procedures planned      Benetta Spar, PharmD, BCPS, [facility] Clinical Pharmacist  Please check AMION for all [place] phone numbers After 10:00 PM, call [place]

## 2020-02-05 NOTE — Anesthesia Preprocedure Evaluation (Addendum)
Anesthesia Evaluation  Patient identified by MRN, date of birth, ID band Patient awake    Reviewed: Allergy & Precautions, NPO status , Patient's Chart, lab work & pertinent test results  History of Anesthesia Complications Negative for: history of anesthetic complications  Airway Mallampati: II  TM Distance: >3 FB Neck ROM: Full    Dental  (+) Edentulous Upper, Edentulous Lower   Pulmonary neg pulmonary ROS,    Pulmonary exam normal        Cardiovascular hypertension, Normal cardiovascular exam+ Valvular Problems/Murmurs AS      Neuro/Psych negative neurological ROS  negative psych ROS   GI/Hepatic Neg liver ROS, PUD,   Endo/Other  diabetes  Renal/GU Renal InsufficiencyRenal disease  negative genitourinary   Musculoskeletal negative musculoskeletal ROS (+)   Abdominal   Peds  Hematology  (+) anemia ,   Anesthesia Other Findings  Admitted with lower extremity weakness, found to have lumbar spine osteomyelitis and bacteremia; eval for endocarditis  Reproductive/Obstetrics                             Anesthesia Physical Anesthesia Plan  ASA: IV  Anesthesia Plan: MAC   Post-op Pain Management:    Induction: Intravenous  PONV Risk Score and Plan: 1 and Propofol infusion, TIVA and Treatment may vary due to age or medical condition  Airway Management Planned: Natural Airway, Nasal Cannula and Simple Face Mask  Additional Equipment: None  Intra-op Plan:   Post-operative Plan:   Informed Consent: I have reviewed the patients History and Physical, chart, labs and discussed the procedure including the risks, benefits and alternatives for the proposed anesthesia with the patient or authorized representative who has indicated his/her understanding and acceptance.   Patient has DNR.  Discussed DNR with patient and Suspend DNR.     Plan Discussed with:   Anesthesia Plan Comments:         Anesthesia Quick Evaluation

## 2020-02-05 NOTE — Procedures (Signed)
INTERVENTIONAL NEURORADIOLOGY BRIEF POSTPROCEDURE NOTE  FLUOROSCOPY GUIDED L3-4 [place] ASPIRATION   Attending: Dr. Pedro Earls  Assistant: None.   Diagnosis: L3-4 discitis/osteomyelitis.   Access site: Percutaneous.   Anesthesia: Moderate sedation.   Medication used: 0.5 mg Versed IV; 50 mcg Fentanyl IV.  Complications: None.   Estimated blood loss: None.   Specimen: 13 cc of thick blood tinged fluid.   Findings: Erosion of the L3-4 endplates.   The patient tolerated the procedure well without incident or complication and is in stable condition.

## 2020-02-06 DIAGNOSIS — D638 Anemia in other chronic diseases classified elsewhere: Secondary | ICD-10-CM | POA: Diagnosis not present

## 2020-02-06 DIAGNOSIS — M4626 Osteomyelitis of vertebra, lumbar region: Secondary | ICD-10-CM

## 2020-02-06 DIAGNOSIS — R7881 Bacteremia: Secondary | ICD-10-CM | POA: Diagnosis not present

## 2020-02-06 DIAGNOSIS — G062 Extradural and subdural abscess, unspecified: Secondary | ICD-10-CM | POA: Diagnosis not present

## 2020-02-06 DIAGNOSIS — I824Z2 Acute embolism and thrombosis of unspecified deep veins of left distal lower extremity: Secondary | ICD-10-CM

## 2020-02-06 DIAGNOSIS — M549 Dorsalgia, unspecified: Secondary | ICD-10-CM

## 2020-02-06 DIAGNOSIS — B9561 Methicillin susceptible Staphylococcus aureus infection as the cause of diseases classified elsewhere: Secondary | ICD-10-CM

## 2020-02-06 LAB — CULTURE, BLOOD (ROUTINE X 2)
Special Requests: ADEQUATE
Special Requests: ADEQUATE

## 2020-02-06 LAB — BASIC METABOLIC PANEL
Anion gap: 12 (ref 5–15)
BUN: 65 mg/dL — ABNORMAL HIGH (ref 8–23)
CO2: 20 mmol/L — ABNORMAL LOW (ref 22–32)
Calcium: 8.3 mg/dL — ABNORMAL LOW (ref 8.9–10.3)
Chloride: 107 mmol/L (ref 98–111)
Creatinine, Ser: 2.52 mg/dL — ABNORMAL HIGH (ref 0.61–1.24)
GFR calc Af Amer: 25 mL/min — ABNORMAL LOW (ref >60)
GFR calc non Af Amer: 22 mL/min — ABNORMAL LOW (ref >60)
Glucose, Bld: 130 mg/dL — ABNORMAL HIGH (ref 70–99)
Potassium: 3.5 mmol/L (ref 3.5–5.1)
Sodium: 139 mmol/L (ref 135–145)

## 2020-02-06 LAB — HEPARIN LEVEL (UNFRACTIONATED)
Heparin Unfractionated: 0.29 IU/mL — ABNORMAL LOW (ref 0.30–0.70)
Heparin Unfractionated: 0.37 IU/mL (ref 0.30–0.70)

## 2020-02-06 LAB — CBC
HCT: 32 % — ABNORMAL LOW (ref 39.0–52.0)
Hemoglobin: 10.1 g/dL — ABNORMAL LOW (ref 13.0–17.0)
MCH: 32.5 pg (ref 26.0–34.0)
MCHC: 31.6 g/dL (ref 30.0–36.0)
MCV: 102.9 fL — ABNORMAL HIGH (ref 80.0–100.0)
Platelets: 150 10*3/uL (ref 150–400)
RBC: 3.11 MIL/uL — ABNORMAL LOW (ref 4.22–5.81)
RDW: 15 % (ref 11.5–15.5)
WBC: 7.3 10*3/uL (ref 4.0–10.5)
nRBC: 0 % (ref 0.0–0.2)

## 2020-02-06 LAB — ACID FAST SMEAR (AFB, MYCOBACTERIA): Acid Fast Smear: NEGATIVE

## 2020-02-06 NOTE — Progress Notes (Signed)
ANTICOAGULATION CONSULT NOTE   Pharmacy Consult for Heparin Indication: acute DVT 02/03/20  No Known Allergies  Patient Measurements: Height: 6\' 1"  (185.4 cm) Weight: 88.5 kg (195 lb) IBW/kg (Calculated) : 79.9 Heparin Dosing Weight: 88.5 kg  Vital Signs: Temp: 99.1 F (37.3 C) (08/04 0459) Temp Source: Oral (08/04 0459) BP: 139/62 (08/04 0459) Pulse Rate: 85 (08/04 0459)  Labs: Recent Labs    02/04/20 0213 02/04/20 0213 02/11/2020 0558 02/06/20 0334 02/06/20 0429  HGB 11.5*   < > 10.3* 10.1*  --   HCT 36.4*  --  33.6* 32.0*  --   PLT 156  --  149* 150  --   HEPARINUNFRC 0.57  --  0.40 0.29*  --   CREATININE 2.51*  --  2.59*  --  2.52*   < > = values in this interval not displayed.    Estimated Creatinine Clearance: 22.5 mL/min (A) (by C-G formula based on SCr of 2.52 mg/dL (H)).   Medical History: Past Medical History:  Diagnosis Date  . Anemia   . Callus   . Diabetes mellitus without complication ([place])   . Gastrointestinal ulcer   . Hypertension    Assessment: 86 y.o. male presenting with back pain and found to have discitis/osteomyelitis, MSSA bacteremia, and new LLE DVT on 02/03/20. No PTA anticoagulation. Pharmacy has been consulted for IV heparin dosing.   Today, heparin level is slightly subtherapeutic at 0.29. Hgb and platelets stable from previous. No overt bleeding noted. Heparin infusion was held ~2 hours yesterday for IR aspiration, restarted around noon 02/04/2020. No issues with the infusion overnight per RN.  Goal of Therapy:  Heparin level 0.3-0.7 units/ml Monitor platelets by anticoagulation protocol: Yes  Plan:  Increase heparin infusion to 1700 units/hr Check 8h HL Monitor CBC, daily heparin level  Continue to monitor for signs/symptoms of bleeding F/u transition to oral anticoagulant when no additional procedures planned    Brendolyn Patty, PharmD Clinical Pharmacist  02/06/2020   8:26 AM   Please check AMION for all [place] phone  numbers After 10:00 PM, call the [place] [phone]

## 2020-02-06 NOTE — Evaluation (Signed)
Physical Therapy Evaluation Patient Details Name: [name] MRN: [id number] DOB: 1928/02/02 Today's Date: 02/06/2020   History of Present Illness  Pt is a pleasant 86 y.o. 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  Pt is s/p L3/4 disc aspiration. Pt has been on Heparin since 02/03/2020 for DVT. TEE conducted and found to have mitral calcification. Pt has PMH of HTN, CKD, HLD and T2DM.     Clinical Impression  Pt was evaluated for the above diagnosis and the impairments listed below. Pt required mod assist for all bed mobility. Pt required min guard for sitting balance. Further mobility limited secondary to pain. Pt was educated on back precautions for pain management. Pt lives alone and utilizes a RW/cane for ambulation. Given current deficits and mobility limitations, recommending SNF level of therapy. Pt would continue to benefit from acute therapy services in order to return to his PLOF. Will continue to follow acutely.     Follow Up Recommendations SNF;Supervision/Assistance - 24 hour    Equipment Recommendations  None recommended by PT    Recommendations for Other Services       Precautions / Restrictions Precautions Precautions: Back Precaution Booklet Issued: No Precaution Comments: pt was educated on BLT precautions for pain management Restrictions Weight Bearing Restrictions: No      Mobility  Bed Mobility Overal bed mobility: Needs Assistance Bed Mobility: Rolling;Sidelying to Sit;Sit to Supine Rolling: Mod assist;+2 for safety/equipment Sidelying to sit: Mod assist;+2 for safety/equipment;HOB elevated;+2 for physical assistance   Sit to supine: Mod assist;+2 for safety/equipment;+2 for physical assistance   General bed mobility comments: pt required +2 mod assistance for safety with rolling at the trunk and hips along with bed railings.  Pt required +2 mod assist for sidelying to  sit  at the trunk and BLE.   Transfers                    Ambulation/Gait                Stairs            Wheelchair Mobility    Modified Rankin (Stroke Patients Only)       Balance Overall balance assessment: Needs assistance Sitting-balance support: Bilateral upper extremity supported;Feet supported Sitting balance-Leahy Scale: Poor Sitting balance - Comments: pt required min guard and UE support while in sitting. Pt was noted to be in a hyperkyphotic position in sitting                      Pertinent Vitals/Pain Pain Assessment: 0-10 Pain Score: 10-Worst pain ever Pain Location: low back, LE  Pain Descriptors / Indicators: Grimacing;Operative site guarding;Constant;Burning Pain Intervention(s): Limited activity within patient's tolerance;Monitored during session    [place] expects to be discharged to:: Private residence Living Arrangements: Alone Available Help at Discharge: Family;Available PRN/intermittently Type of Home: House Home Access: Level entry     Home Layout: One level Home Equipment: Walker - 2 wheels;[name] - single point Additional Comments: pt lives alone as his wife just passed away this year. Pt utilizes RW in the home and uses a cane in the community. Son lives in [place] area and he lives in [name]    Prior Function Level of Independence: Independent with assistive device(s)         Comments: RW used for ambulation in the home, cane for ambulation in community  Hand Dominance        Extremity/Trunk Assessment   Upper Extremity Assessment Upper Extremity Assessment: Defer to OT evaluation    Lower Extremity Assessment Lower Extremity Assessment: Generalized weakness;RLE deficits/detail;LLE deficits/detail RLE Deficits / Details: gross limited ROM, pt reports RLE ankle was frozen in an accident  LLE Deficits / Details: gross limited ROM     Cervical / Trunk Assessment Cervical /  Trunk Assessment: Kyphotic  Communication   Communication: No difficulties  Cognition Arousal/Alertness: Awake/alert Behavior During Therapy: Flat affect Overall Cognitive Status: Within Functional Limits for tasks assessed                                        General Comments General comments (skin integrity, edema, etc.): pt was eduated on ankle pumps for HEP     Exercises     Assessment/Plan    PT Assessment Patient needs continued PT services  PT Problem List Decreased strength;Decreased range of motion;Decreased activity tolerance;Decreased balance;Decreased mobility;Decreased coordination;Decreased knowledge of precautions;Pain       PT Treatment Interventions DME instruction;Gait training;Functional mobility training;Therapeutic exercise;Therapeutic activities;Balance training;Neuromuscular re-education;Patient/family education;Modalities    PT Goals (Current goals can be found in the Care Plan section)  Acute Rehab PT Goals Patient Stated Goal: be pain free PT Goal Formulation: With patient Time For Goal Achievement: 02/20/20 Potential to Achieve Goals: Fair    Frequency Min 2X/week   Barriers to discharge Decreased caregiver support lives alone and family live too far away for assist.    Co-evaluation               AM-PAC PT "6 Clicks" Mobility  Outcome Measure Help needed turning from your back to your side while in a flat bed without using bedrails?: A Lot Help needed moving from lying on your back to sitting on the side of a flat bed without using bedrails?: A Lot Help needed moving to and from a bed to a chair (including a wheelchair)?: A Lot Help needed standing up from a chair using your arms (e.g., wheelchair or bedside chair)?: A Lot Help needed to walk in hospital room?: Total Help needed climbing 3-5 steps with a railing? : Total 6 Click Score: 10    End of Session   Activity Tolerance: Patient limited by pain Patient  left: in bed;with call [name]/phone within reach;with bed alarm set Nurse Communication: Mobility status PT Visit Diagnosis: Pain;Muscle weakness (generalized) (M62.81) Pain - part of body: Leg (BLE and back)    Time: 2992-4268 PT Time Calculation (min) (ACUTE ONLY): 20 min   Charges:   PT Evaluation $PT Eval Moderate Complexity: 1 Mod         Gloriann Loan, SPT  [place]  Office: [phone]  02/06/2020, 2:53 PM

## 2020-02-06 NOTE — Progress Notes (Signed)
ANTICOAGULATION CONSULT NOTE - Follow Up Consult  Pharmacy Consult for Heparin Indication: DVT  No Known Allergies  Patient Measurements: Height: 6\' 1"  (185.4 cm) Weight: 88.5 kg (195 lb) IBW/kg (Calculated) : 79.9 Heparin Dosing Weight:    Vital Signs: Temp: 98.4 F (36.9 C) (08/04 1407) Temp Source: Oral (08/04 1407) BP: 157/57 (08/04 1407) Pulse Rate: 84 (08/04 1407)  Labs: Recent Labs    02/04/20 0213 02/04/20 0213 02/04/2020 0558 02/06/20 0334 02/06/20 0429 02/06/20 1840  HGB 11.5*   < > 10.3* 10.1*  --   --   HCT 36.4*  --  33.6* 32.0*  --   --   PLT 156  --  149* 150  --   --   HEPARINUNFRC 0.57   < > 0.40 0.29*  --  0.37  CREATININE 2.51*  --  2.59*  --  2.52*  --    < > = values in this interval not displayed.    Estimated Creatinine Clearance: 22.5 mL/min (A) (by C-G formula based on SCr of 2.52 mg/dL (H)).    Assessment:  Anticoag: hep gtt, new LLE DVT 02/03/20. Hep level 0.37 now in goal range.  Goal of Therapy:  Heparin level 0.3-0.7 units/ml Monitor platelets by anticoagulation protocol: Yes   Plan:  Con't  heparin infusion to 1700 units/hr Monitor CBC, daily heparin level   Sia Gabrielsen S. Alford Highland, PharmD, BCPS Clinical Staff Pharmacist [email or web address] Alford Highland, Avionna Bower [place] 02/06/2020,7:23 PM

## 2020-02-06 NOTE — Progress Notes (Signed)
PROGRESS NOTE    HUTSON LUFT  WNI:[id number] DOB: Apr 15, 1928 DOA: 01/15/2020 PCP: Haywood Pao, MD    Brief Narrative:  Edward Kobashigawa Gregoryis an 86 y.o.malewith HTN, DM 2 who was in his usual state of health until 2:00 PM 01/14/2020 when he noted sudden onset of back pain associated with lower extremity weakness.   Patient reports of generalized weakness, probably due to his low iron, he gets IV iron with his nephrologist twice a month.  Patient subsequently had acute lower extremity weakness, and back pain unable to ambulate  ED evaluation: He underwent Doppler study which did show anacute DVT. Patient underwent an MRI L-spine which found osteomyelitis/discitis at the L2-3 level with an epidural abscess measuring 9.2 cm extending down into the bilateral psoas muscles.   02/04/2020 -patient hemodynamically stable exception of a T-max of 101.4, 3 out of 4 blood culture positive for staph.   -Patient was not on any antibiotics, no notes from consultants No consultation to IR or IV was placed. Neurosurgery/infectious disease, IR, pharmacy were all consulted. Case discussed with cardiology regarding TEE Patient status was discussed in detail with patient and his son at bedside.  02/23/2020 -T-max 103.1 overnight.. Otherwise hemodynamically stable Status post TEE this morning-tolerated well -reported no obvious agitation Status post L3-4 fluid drainage -reporting 13 cc of thick blood-tinged fluid  Consultants:  -Neurosurgery -Infectious disease -Interventional radiology -Pharmacy  Procedures: TEE Antimicrobials:  8/2/2021cefazolin 2 g every 12 hours   Subjective: Back pain when he moves, otherwise no other complaints.  Objective: Vitals:   02/12/2020 1700 03/04/2020 1940 02/06/20 0106 02/06/20 0459  BP: (!) 134/53 (!) 128/50 (!) 138/44 139/62  Pulse: 90 88 63 85  Resp: _0 Temp: 99.9 F (37.7 C) 99.8 F (37.7 C) 99 F (37.2 C) 99.1 F (37.3 C)  TempSrc:  Oral Oral Oral Oral  SpO2: 92% 91% 92% 90%  Weight:      Height:        Intake/Output Summary (Last 24 hours) at 02/06/2020 0826 Last data filed at 02/06/2020 0648 Gross per 24 hour  Intake 2908.14 ml  Output 1100 ml  Net 1808.14 ml   Filed Weights   02/03/20 1426  Weight: 88.5 kg    Examination:  General exam: Appears calm and comfortable  Respiratory system: Clear to auscultation. Respiratory effort normal. Cardiovascular system: S1 & S2 heard, RRR. No JVD, murmurs, rubs, gallops or clicks.  Gastrointestinal system: Abdomen is nondistended, soft and nontender. Normal bowel sounds heard. Central nervous system: Alert and oriented Extremities: No edema Skin: Warm and dry Psychiatry:  Mood & affect appropriate.     Data Reviewed: I have personally reviewed following labs and imaging studies  CBC: Recent Labs  Lab 01/31/20 0832 01/05/2020 2151 02/04/20 0213 02/21/2020 0558 02/06/20 0334  WBC  --  11.8* 14.6* 10.4 7.3  NEUTROABS  --  10.7*  --   --   --   HGB 11.4* 12.0* 11.5* 10.3* 10.1*  HCT  --  39.2 36.4* 33.6* 32.0*  MCV  --  105.7* 104.0* 107.7* 102.9*  PLT  --  149* 156 149* 381   Basic Metabolic Panel: Recent Labs  Lab 01/16/2020 2151 02/04/20 0213 03/03/2020 0558 02/06/20 0429  NA 141 138 136 139  K 4.3 5.0 3.8 3.5  CL 104 104 105 107  CO2 24 26 21* 20*  GLUCOSE 211* 131* 97 130*  BUN 55* 58* 62* 65*  CREATININE 2.76* 2.51* 2.59* 2.52*  CALCIUM 9.4 8.9 8.3* 8.3*   GFR: Estimated Creatinine Clearance: 22.5 mL/min (A) (by C-G formula based on SCr of 2.52 mg/dL (H)). Liver Function Tests: Recent Labs  Lab 01/25/2020 2151 02/13/2020 0558  AST 13* 16  ALT 14 12  ALKPHOS 102 68  BILITOT 0.4 0.9  PROT 6.9 5.3*  ALBUMIN 3.4* 2.0*   No results for input(s): LIPASE, AMYLASE in the last 168 hours. No results for input(s): AMMONIA in the last 168 hours. Coagulation Profile: No results for input(s): INR, PROTIME in the last 168 hours. Cardiac Enzymes: No  results for input(s): CKTOTAL, CKMB, CKMBINDEX, TROPONINI in the last 168 hours. BNP (last 3 results) No results for input(s): PROBNP in the last 8760 hours. HbA1C: Recent Labs    02/04/20 0004  HGBA1C 6.4*   CBG: Recent Labs  Lab 02/04/20 1155 02/04/20 1718 02/04/20 2110 02/10/2020 0655 02/20/2020 1241  GLUCAP 165* 121* 109* 101*  101* 103*   Lipid Profile: No results for input(s): CHOL, HDL, LDLCALC, TRIG, CHOLHDL, LDLDIRECT in the last 72 hours. Thyroid Function Tests: No results for input(s): TSH, T4TOTAL, FREET4, T3FREE, THYROIDAB in the last 72 hours. Anemia Panel: No results for input(s): VITAMINB12, FOLATE, FERRITIN, TIBC, IRON, RETICCTPCT in the last 72 hours. Sepsis Labs: No results for input(s): PROCALCITON, LATICACIDVEN in the last 168 hours.  Recent Results (from the past 240 hour(s))  SARS Coronavirus 2 by RT PCR (hospital order, performed in [facility] hospital lab) Nasopharyngeal Nasopharyngeal Swab     Status: None   Collection Time: 02/03/20  2:27 PM   Specimen: Nasopharyngeal Swab  Result Value Ref Range Status   SARS Coronavirus 2 NEGATIVE NEGATIVE Final    Comment: (NOTE) SARS-CoV-2 target nucleic acids are NOT DETECTED.  The SARS-CoV-2 RNA is generally detectable in upper and lower respiratory specimens during the acute phase of infection. The lowest concentration of SARS-CoV-2 viral copies this assay can detect is 250 copies / mL. A negative result does not preclude SARS-CoV-2 infection and should not be used as the sole basis for treatment or other patient management decisions.  A negative result may occur with improper specimen collection / handling, submission of specimen other than nasopharyngeal swab, presence of viral mutation(s) within the areas targeted by this assay, and inadequate number of viral copies (<250 copies / mL). A negative result must be combined with clinical observations, patient history, and epidemiological  information.  Fact Sheet for Patients:   StrictlyIdeas.no  Fact Sheet for Healthcare Providers: BankingDealers.co.za  This test is not yet approved or  cleared by the [place] FDA and has been authorized for detection and/or diagnosis of SARS-CoV-2 by FDA under an Emergency Use Authorization (EUA).  This EUA will remain in effect (meaning this test can be used) for the duration of the COVID-19 declaration under Section 564(b)(1) of the Act, 21 U.S.C. section 360bbb-3(b)(1), unless the authorization is terminated or revoked sooner.  Performed at [facility], [place] [address], [place], [place] [address]   Blood culture (routine x 2)     Status: Abnormal   Collection Time: 02/03/20  4:41 PM   Specimen: BLOOD LEFT HAND  Result Value Ref Range Status   Specimen Description BLOOD LEFT HAND  Final   Special Requests   Final    BOTTLES DRAWN AEROBIC AND ANAEROBIC Blood Culture adequate volume   Culture  Setup Time   Final    GRAM POSITIVE COCCI IN CLUSTERS IN BOTH AEROBIC AND ANAEROBIC BOTTLES Organism ID to  follow CRITICAL RESULT CALLED TO, READ BACK BY AND VERIFIED WITH: A. Rogers Blocker PharmD 8:50 02/04/20 ([email or web address]) Performed at [facility], [place] [address] [place], [place] [address]    Culture STAPHYLOCOCCUS AUREUS (A)  Final   Report Status 02/06/2020 FINAL  Final   Organism ID, Bacteria STAPHYLOCOCCUS AUREUS  Final      Susceptibility   Staphylococcus aureus - MIC*    CIPROFLOXACIN <=0.5 SENSITIVE Sensitive     ERYTHROMYCIN >=8 RESISTANT Resistant     GENTAMICIN <=0.5 SENSITIVE Sensitive     OXACILLIN 1 SENSITIVE Sensitive     TETRACYCLINE <=1 SENSITIVE Sensitive     VANCOMYCIN 1 SENSITIVE Sensitive     TRIMETH/SULFA <=10 SENSITIVE Sensitive     CLINDAMYCIN <=0.25 SENSITIVE Sensitive     RIFAMPIN <=0.5 SENSITIVE Sensitive     Inducible Clindamycin NEGATIVE Sensitive     * STAPHYLOCOCCUS AUREUS  Blood Culture ID Panel  (Reflexed)     Status: Abnormal   Collection Time: 02/03/20  4:41 PM  Result Value Ref Range Status   Enterococcus species NOT DETECTED NOT DETECTED Final   Listeria monocytogenes NOT DETECTED NOT DETECTED Final   Staphylococcus species DETECTED (A) NOT DETECTED Final    Comment: CRITICAL RESULT CALLED TO, READ BACK BY AND VERIFIED WITH: A. Rogers Blocker PharmD 8:50 02/04/20 ([email or web address])    Staphylococcus aureus (BCID) DETECTED (A) NOT DETECTED Final    Comment: Methicillin (oxacillin) susceptible Staphylococcus aureus (MSSA). Preferred therapy is anti staphylococcal beta lactam antibiotic (Cefazolin or Nafcillin), unless clinically contraindicated. CRITICAL RESULT CALLED TO, READ BACK BY AND VERIFIED WITH: A. Rogers Blocker PharmD 8:50 02/04/20 ([email or web address])    Methicillin resistance NOT DETECTED NOT DETECTED Final   Streptococcus species NOT DETECTED NOT DETECTED Final   Streptococcus agalactiae NOT DETECTED NOT DETECTED Final   Streptococcus pneumoniae NOT DETECTED NOT DETECTED Final   Streptococcus pyogenes NOT DETECTED NOT DETECTED Final   Acinetobacter baumannii NOT DETECTED NOT DETECTED Final   Enterobacteriaceae species NOT DETECTED NOT DETECTED Final   Enterobacter cloacae complex NOT DETECTED NOT DETECTED Final   Escherichia coli NOT DETECTED NOT DETECTED Final   Klebsiella oxytoca NOT DETECTED NOT DETECTED Final   Klebsiella pneumoniae NOT DETECTED NOT DETECTED Final   Proteus species NOT DETECTED NOT DETECTED Final   Serratia marcescens NOT DETECTED NOT DETECTED Final   Haemophilus influenzae NOT DETECTED NOT DETECTED Final   Neisseria meningitidis NOT DETECTED NOT DETECTED Final   Pseudomonas aeruginosa NOT DETECTED NOT DETECTED Final   Candida albicans NOT DETECTED NOT DETECTED Final   Candida glabrata NOT DETECTED NOT DETECTED Final   Candida krusei NOT DETECTED NOT DETECTED Final   Candida parapsilosis NOT DETECTED NOT DETECTED Final   Candida tropicalis NOT DETECTED NOT DETECTED Final     Comment: Performed at [facility], [place] [address] [place], [place] [address]  Blood culture (routine x 2)     Status: Abnormal (Preliminary result)   Collection Time: 02/03/20  4:45 PM   Specimen: BLOOD RIGHT HAND  Result Value Ref Range Status   Specimen Description BLOOD RIGHT HAND  Final   Special Requests   Final    BOTTLES DRAWN AEROBIC AND ANAEROBIC Blood Culture adequate volume   Culture  Setup Time   Final    GRAM POSITIVE COCCI IN CLUSTERS IN BOTH AEROBIC AND ANAEROBIC BOTTLES CRITICAL VALUE NOTED.  VALUE IS CONSISTENT WITH PREVIOUSLY REPORTED AND CALLED VALUE. Performed at [facility], [place] [address] [place],  [address]  Culture STAPHYLOCOCCUS AUREUS (A)  Final   Report Status PENDING  Incomplete  Culture, blood (routine x 2)     Status: None (Preliminary result)   Collection Time: 02/04/2020  5:58 AM   Specimen: BLOOD  Result Value Ref Range Status   Specimen Description BLOOD RIGHT HAND  Final   Special Requests   Final    BOTTLES DRAWN AEROBIC ONLY Blood Culture adequate volume   Culture   Final    NO GROWTH 1 DAY Performed at [facility], [place] [address], [place], [place] [address]    Report Status PENDING  Incomplete  Culture, blood (routine x 2)     Status: None (Preliminary result)   Collection Time: 02/08/2020  6:04 AM   Specimen: BLOOD  Result Value Ref Range Status   Specimen Description BLOOD LEFT HAND  Final   Special Requests   Final    BOTTLES DRAWN AEROBIC ONLY Blood Culture adequate volume   Culture   Final    NO GROWTH 1 DAY Performed at [facility], [place] [address] [place], [place] [address]    Report Status PENDING  Incomplete  Aerobic/Anaerobic Culture (surgical/deep wound)     Status: None (Preliminary result)   Collection Time: 03/02/2020 11:35 AM   Specimen: Abscess; Fine Needle Aspirate  Result Value Ref Range Status   Specimen Description ABSCESS  Final   Special Requests L3 L4 DISC  Final   Gram Stain    Final    ABUNDANT WBC PRESENT, PREDOMINANTLY PMN FEW GRAM POSITIVE COCCI Performed at [facility], [address], [place], [place] [address]    Culture PENDING  Incomplete   Report Status PENDING  Incomplete         Radiology Studies: DG CHEST PORT 1 VIEW  Result Date: 02/26/2020 CLINICAL DATA:  Postop EXAM: PORTABLE CHEST 1 VIEW COMPARISON:  None. FINDINGS: The cardiomediastinal silhouette is enlarged in contour. Likely small bilateral pleural effusions. No pneumothorax. Atherosclerotic calcifications of the aorta. Peribronchial cuffing. Hilar fullness and peripheral interstitial markings. Questionable nodule versus bone island of the RIGHT apex. Visualized abdomen is unremarkable. No acute osseous abnormality noted. IMPRESSION: 1. Findings suggestive of pulmonary edema with small bilateral pleural effusions. 2. Questionable nodule versus bone island of the RIGHT apex. Recommend follow-up PA and lateral radiograph. Electronically Signed   By: Valentino Saxon MD   On: 02/23/2020 09:39   ECHOCARDIOGRAM COMPLETE  Result Date: 02/04/2020    ECHOCARDIOGRAM REPORT   Patient Name:   GOBLE FUDALA Date of Exam: 02/04/2020 Medical Rec #:  [id number]         Height:       73.0 in Accession #:    [id number]        Weight:       195.0 lb Date of Birth:  04/21/1928          BSA:          2.129 m Patient Age:    53 years          BP:           125/48 mmHg Patient Gender: M                 HR:           83 bpm. Exam Location:  Inpatient Procedure: 2D Echo, Cardiac Doppler and Color Doppler Indications:    Endocarditis I38  History:        Patient has  no prior history of Echocardiogram examinations.                 Risk Factors:Hypertension and Diabetes.  Sonographer:    Jonelle Sidle Dance Referring Phys: [phone] SEYED A SHAHMEHDI  Sonographer Comments: Suboptimal parasternal window. IMPRESSIONS  1. Left ventricular ejection fraction, by estimation, is 55 to 60%. The left ventricle has normal function. The  left ventricle has no regional wall motion abnormalities. Left ventricular diastolic parameters are consistent with Grade I diastolic dysfunction (impaired relaxation).  2. Right ventricular systolic function is normal. The right ventricular size is normal. Tricuspid regurgitation signal is inadequate for assessing PA pressure.  3. Left atrial size was mildly dilated.  4. The mitral valve is degenerative. Trivial mitral valve regurgitation. No evidence of mitral stenosis.  5. The aortic valve is abnormal. Aortic valve regurgitation is not visualized. Mild aortic valve stenosis. Aortic valve mean gradient measures 17.0 mmHg.  6. The inferior vena cava is normal in size with greater than 50% respiratory variability, suggesting right atrial pressure of 3 mmHg. Conclusion(s)/Recommendation(s): No evidence of valvular vegetations on this transthoracic echocardiogram. Would recommend a transesophageal echocardiogram to exclude infective endocarditis if clinically indicated. FINDINGS  Left Ventricle: Left ventricular ejection fraction, by estimation, is 55 to 60%. The left ventricle has normal function. The left ventricle has no regional wall motion abnormalities. The left ventricular internal cavity size was normal in size. There is  no left ventricular hypertrophy. Left ventricular diastolic parameters are consistent with Grade I diastolic dysfunction (impaired relaxation). Right Ventricle: The right ventricular size is normal. No increase in right ventricular wall thickness. Right ventricular systolic function is normal. Tricuspid regurgitation signal is inadequate for assessing PA pressure. Left Atrium: Left atrial size was mildly dilated. Right Atrium: Right atrial size was normal in size. Pericardium: There is no evidence of pericardial effusion. Mitral Valve: The mitral valve is degenerative in appearance. Mild to moderate mitral annular calcification. Trivial mitral valve regurgitation. No evidence of mitral valve  stenosis. Tricuspid Valve: The tricuspid valve is normal in structure. Tricuspid valve regurgitation is trivial. No evidence of tricuspid stenosis. Aortic Valve: The aortic valve is abnormal. Aortic valve regurgitation is not visualized. Mild aortic stenosis is present. There is severe calcifcation of the aortic valve. Aortic valve mean gradient measures 17.0 mmHg. Aortic valve peak gradient measures 25.0 mmHg. Aortic valve area, by VTI measures 1.37 cm. Pulmonic Valve: The pulmonic valve was not well visualized. Pulmonic valve regurgitation is trivial. No evidence of pulmonic stenosis. Aorta: The aortic root was not well visualized. Venous: The inferior vena cava is normal in size with greater than 50% respiratory variability, suggesting right atrial pressure of 3 mmHg. IAS/Shunts: No atrial level shunt detected by color flow Doppler.  LEFT VENTRICLE PLAX 2D LVIDd:         4.90 cm  Diastology LVIDs:         4.10 cm  LV e' lateral:   6.90 cm/s LV PW:         1.20 cm  LV E/e' lateral: 9.7 LV IVS:        1.00 cm  LV e' medial:    5.22 cm/s LVOT diam:     1.95 cm  LV E/e' medial:  12.8 LV SV:         64 LV SV Index:   30 LVOT Area:     2.99 cm  RIGHT VENTRICLE             IVC RV Basal  diam:  2.40 cm     IVC diam: 1.60 cm RV S prime:     16.40 cm/s TAPSE (M-mode): 0.9 cm LEFT ATRIUM             Index       RIGHT ATRIUM           Index LA diam:        4.50 cm 2.11 cm/m  RA Area:     13.60 cm LA Vol (A2C):   82.2 ml 38.62 ml/m RA Volume:   28.80 ml  13.53 ml/m LA Vol (A4C):   59.4 ml 27.91 ml/m LA Biplane Vol: 72.6 ml 34.11 ml/m  AORTIC VALVE AV Area (Vmax):    1.25 cm AV Area (Vmean):   1.21 cm AV Area (VTI):     1.37 cm AV Vmax:           250.00 cm/s AV Vmean:          184.400 cm/s AV VTI:            0.467 m AV Peak Grad:      25.0 mmHg AV Mean Grad:      17.0 mmHg LVOT Vmax:         105.00 cm/s LVOT Vmean:        74.600 cm/s LVOT VTI:          0.214 m LVOT/AV VTI ratio: 0.46  AORTA Ao Root diam: 3.90 cm  MITRAL VALVE MV Area (PHT): 2.26 cm     SHUNTS MV Decel Time: 335 msec     Systemic VTI:  0.21 m MV E velocity: 66.70 cm/s   Systemic Diam: 1.95 cm MV A velocity: 128.00 cm/s MV E/A ratio:  0.52 Cherlynn Kaiser MD Electronically signed by Cherlynn Kaiser MD Signature Date/Time: 02/04/2020/6:38:32 PM    Final    ECHO TEE  Result Date: 02/15/2020    TRANSESOPHOGEAL ECHO REPORT   Patient Name:   HOSEY BURMESTER Date of Exam: 03/01/2020 Medical Rec #:  [id number]         Height:       73.0 in Accession #:    [id number]        Weight:       195.0 lb Date of Birth:  01-29-1928          BSA:          2.129 m Patient Age:    68 years          BP:           104/32 mmHg Patient Gender: M                 HR:           68 bpm. Exam Location:  Inpatient Procedure: TEE-Intraopertive, Color Doppler and Cardiac Doppler Indications:     Bacteremia 790.7 / R78.81  History:         Patient has prior history of Echocardiogram examinations, most                  recent 02/04/2020. Risk Factors:Hypertension and Diabetes.                  Anemia.  Sonographer:     Darlina Sicilian RDCS Referring Phys:  [id number] HAO MENG Diagnosing Phys: Skeet Latch MD  Sonographer Comments: Transgastic imaging not obtained PROCEDURE: After discussion of the risks and benefits of a TEE, an informed consent was obtained from the patient.  The transesophogeal probe was passed without difficulty through the esophogus of the patient. Local oropharyngeal anesthetic was provided with viscous lidocaine. Sedation performed by different physician. The patient was monitored while under deep sedation. Anesthestetic sedation was provided intravenously by Anesthesiology: 160.16m of Propofol. The patient's vital signs; including heart rate, blood pressure, and oxygen saturation; remained stable throughout the procedure. The patient developed no complications during the procedure. IMPRESSIONS  1. Left ventricular ejection fraction, by estimation, is 55 to 60%. The left  ventricle has normal function. The left ventricle has no regional wall motion abnormalities.  2. Right ventricular systolic function is normal. The right ventricular size is normal.  3. Left atrial size was mildly dilated. No left atrial/left atrial appendage thrombus was detected.  4. There is a tiny (0.5 x 0.3 cm) fixed, hyperechoic density on the posterior mitral valve leaflet, consistent with MAC. There is no evidence of endocarditis. The mitral valve is normal in structure. Mild mitral valve regurgitation. No evidence of mitral stenosis.  5. Aortic valve leaflet motion is restricted. No gradients were obtained because we wer unable to get transgastric images.. The aortic valve is normal in structure. Aortic valve regurgitation is not visualized. No aortic stenosis is present.  6. Descending aorta not well visualized.  7. The inferior vena cava is normal in size with greater than 50% respiratory variability, suggesting right atrial pressure of 3 mmHg. Conclusion(s)/Recommendation(s): No evidence of vegetation/infective endocarditis on this transesophageal echocardiogram. FINDINGS  Left Ventricle: Left ventricular ejection fraction, by estimation, is 55 to 60%. The left ventricle has normal function. The left ventricle has no regional wall motion abnormalities. The left ventricular internal cavity size was normal in size. There is  no left ventricular hypertrophy. Right Ventricle: The right ventricular size is normal. No increase in right ventricular wall thickness. Right ventricular systolic function is normal. Left Atrium: Left atrial size was mildly dilated. No left atrial/left atrial appendage thrombus was detected. Right Atrium: Right atrial size was normal in size. Pericardium: There is no evidence of pericardial effusion. Mitral Valve: There is a tiny (0.5 x 0.3 cm) fixed, hyperechoic density on the posterior mitral valve leaflet, consistent with MAC. There is no evidence of endocarditis. The mitral valve  is normal in structure. There is mild thickening of the mitral valve leaflet(s). Normal mobility of the mitral valve leaflets. Mild mitral annular calcification. Mild mitral valve regurgitation. No evidence of mitral valve stenosis. Tricuspid Valve: The tricuspid valve is normal in structure. Tricuspid valve regurgitation is not demonstrated. No evidence of tricuspid stenosis. Aortic Valve: Aortic valve leaflet motion is restricted. No gradients were obtained because we wer unable to get transgastric images. The aortic valve is normal in structure.. There is moderate thickening and moderate calcification of the aortic valve. Aortic valve regurgitation is not visualized. No aortic stenosis is present. There is moderate thickening of the aortic valve. There is moderate calcification of the aortic valve. Pulmonic Valve: The pulmonic valve was normal in structure. Pulmonic valve regurgitation is not visualized. No evidence of pulmonic stenosis. Aorta: Descending aorta not well visualized. The aortic root is normal in size and structure. Venous: The inferior vena cava is normal in size with greater than 50% respiratory variability, suggesting right atrial pressure of 3 mmHg. IAS/Shunts: No atrial level shunt detected by color flow Doppler. TSkeet LatchMD Electronically signed by TSkeet LatchMD Signature Date/Time: 02/14/2020/11:47:32 AM    Final (Updated)    IR LUMBAR DErieW/IMG GUIDE  Result Date: 02/16/2020 INDICATION:  86 year old male presenting with sudden onset of back pain and lower extremity weakness for 3 days. MRI of the lumbar spine showed L2-L3 and L3-L4 discitis/osteomyelitis. He comes to our service for a fluoroscopy guided disc aspiration. EXAM: Fluoroscopy guided L3-4 disc aspiration MEDICATIONS: The patient is currently admitted to the hospital and receiving intravenous antibiotics. The antibiotics were administered within an appropriate time frame prior to the initiation of the  procedure. ANESTHESIA/SEDATION: Fentanyl 50 mcg IV; Versed 0.5 mg IV Moderate Sedation Time:  15 The patient was continuously monitored during the procedure by the interventional radiology nurse under my direct supervision. COMPLICATIONS: None immediate. PROCEDURE: Informed written consent was obtained from the patient after a thorough discussion of the procedural risks, benefits and alternatives. All questions were addressed. Maximal Sterile Barrier Technique was utilized including caps, mask, sterile gowns, sterile gloves, sterile drape, hand hygiene and skin antiseptic. A timeout was performed prior to the initiation of the procedure. The a hand was made to lie prone in the angiography table. The lower back was prepped and draped in the usual sterile fashion. The L3-4 disc space was identified under fluoroscopy in the frontal and lateral planes. The skin was infiltrated with lidocaine 1% approximately 4.5 cm to the left of midline. Subsequently, an 18 gauge x 15 cm needle was advanced under fluoroscopic guidance into the L3-4 disc space. Then, a 22 gauge x 20 cm spinal needle was advanced through the 18 gauge needle into the disc space. Attempted aspiration through the 22 gauge needle proved unsuccessful. The needle was removed and the stylet of the 18 gauge needle was introduced. The 18 gauge needle was then advanced into the center of the disc space. The stylet was removed and 13 cc of thick blood tinged fluid was aspirated. The needle was subsequently withdrawn. The skin was seen and dressed with a sterile bandage. The patient tolerated the procedure well without incident or complication. IMPRESSION: Successful fluoroscopy guided L3-4 disc aspiration. Electronically Signed   By: Pedro Earls M.D.   On: 02/21/2020 11:56        Scheduled Meds: . diltiazem  240 mg Oral Daily  . furosemide  40 mg Oral Daily  . lactose free nutrition  237 mL Oral Daily  . pravastatin  40 mg Oral Daily    Continuous Infusions: . sodium chloride 100 mL/hr at 02/06/20 0648  .  ceFAZolin (ANCEF) IV Stopped (02/20/2020 2154)  . heparin 1,550 Units/hr (02/06/20 3220)    Assessment & Plan:   Principal Problem:   Epidural abscess Active Problems:   Diabetes ([place])   Anemia of chronic disease   Hypertension   Pressure injury of skin  Possible epidural abscess fluid collection/ Discitis / Osteomyelitis L3-4  -MRI of spine was reviewed 9 cm epidural fluid was identified extending into bilateral psoas -02/10/2020 status post CT-guided aspiration of  L3-4 fluid drainage -reporting 13 cc of thick blood-tinged fluid ID following -continue with IV antibiotics Monitor blood cultures for clearance of bacteremia Post central line/PICC placement pending clearance of cultures Neurosurgery following -Neurosurgery does not recommend neurosurgical indication at this time.  If he fails antibiotic treatment over time then discussion can be made paraspinal fluid cultures and cytology >> pending   Bacteremia-MSSA - 3/4 blood cultures are growing staph) ID following continue cefazolin  Monitor for clearance of bacteremia  -02/29/2020 echocardiogram/TEE completed, reporting no obvious vegetation, no signs of endocarditis  Acute DVT Continue heparin drip Once stable can anticipate transition to oral anticoagulation  CKDIIIb Patient has known chronic kidney disease with GFR of 20, creatinine is at baseline.  Slight -Creatinine 2.76 >>  2.51 >> 2.59>>>2.52 Continue to monitor  HTN Overall stable, continue diltiazem  On Lasix also but apparently was held earlier  Continue to monitor    DM 2 Hold Januvia CBG with very lowSSI coverage given CKD. Monitor blood glucose levels closely as he is risk for hypoglycemia.  Patient is DNR Discussed with patient, and his son at bedside he is clear that he would not like to be resuscitated should his heart stop.   Stage II pressure ulcer (POA)/right  first toe, ball of the toe-nondraining Continue wound care Monitoring closely, no signs of infection erythema edema or drainage        DVT prophylaxis: Heparin drip Code Status: DNR Family Communication: None at bedside Disposition Plan: Home Status is: Inpatient  Remains inpatient appropriate because:IV treatments appropriate due to intensity of illness or inability to take PO   Dispo: The patient is from: Home              Anticipated d/c is to: Home              Anticipated d/c date is: > 3 days              Patient currently is not medically stable to d/c.            LOS: 3 days   Time spent: 45 min with >50% on coc    Nolberto Hanlon, MD [facility] Pager [phone]  If 7PM-7AM, please contact night-coverage [email or web address] Password [facility] 02/06/2020, 8:26 AM

## 2020-02-06 NOTE — Progress Notes (Signed)
Occupational Therapy Evaluation Patient Details Name: [name] MRN: [id number] DOB: May 28, 1928 Today's Date: 02/06/2020    History of Present Illness Pt is a pleasant 86 y.o. 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  Pt is s/p L3/4 disc aspiration. Pt has been on Heparin since 02/03/2020 for DVT. TEE conducted and found to have mitral calcification. Pt has PMH of HTN, CKD, HLD and T2DM.    Clinical Impression   Pt admitted for above and limited by increased pain, balance/endurance/activity tolerance/strength deficits. Prior to hospitalization, pt was Mod I with ADLs/IADLs including driving and grocery shopping. Pt typically uses a RW to ambulate at home and a cane in public places. Pt's son assists PRN with yard work and more complex ADLs/IADLs. Pt's son lives ~15 to 20 minutes away and checks on his dad everyday although he works full-time.   Today, pt received supine in bed, agreeable to OT eval. Pt required mod assist for bed mobility, with support at trunk and BLEs. Educated pt on log rolling technique and spinal precautions. While sitting EOB, pt attempted to don/doff bilateral socks, requiring max-dependent assist. Pt declined grooming/dressing activities. Pt attempted sit>stand from EOB with max assist using RW, however pt unsuccessful and requested to try again tomorrow with an elevated surface. Pt is functioning below baseline. Recommending SNF for d/c secondary to safety concerns. Pt would benefit from continued skilled acute OT services to address self care and functional mobility in order to maximize independence with daily routine.     Follow Up Recommendations  SNF;Supervision/Assistance - 24 hour    Equipment Recommendations  Other (comment) (defer to next venue of care)    Recommendations for Other Services  None.     Precautions / Restrictions Precautions Precautions: Back Precaution  Booklet Issued: No Precaution Comments: pt was educated on BLT precautions for pain management Restrictions Weight Bearing Restrictions: No      Mobility Bed Mobility Overal bed mobility: Needs Assistance Bed Mobility: Rolling;Sidelying to Sit;Sit to Supine Rolling: Mod assist;+2 for safety/equipment Sidelying to sit: Mod assist;+2 for safety/equipment;HOB elevated;+2 for physical assistance   Sit to supine: Mod assist;+2 for safety/equipment;+2 for physical assistance   General bed mobility comments: pt required +2 mod assistance for safety with rolling at the trunk and hips along with bed railings.  Pt required +2 mod assist for sidelying to sit at the trunk and BLE.   Transfers Overall transfer level: Needs assistance Equipment used: Rolling walker (2 wheeled) Transfers: Sit to/from Stand Sit to Stand: Max assist;+2 physical assistance;+2 safety/equipment         General transfer comment: attempted sit>stand from EOB using RW however pt unsuccessful secondary to fatigue and weakness; will attempt with raised surface and 2+ people next time    Balance Overall balance assessment: Needs assistance Sitting-balance support: No upper extremity supported;Feet supported Sitting balance-Leahy Scale: Good Sitting balance - Comments: pt able to maintain sitting balance at EOB without LOB or leaning   Standing balance support:  (NA)        ADL either performed or assessed with clinical judgement   ADL Overall ADL's : Needs assistance/impaired Eating/Feeding: Set up   Grooming: Set up Grooming Details (indicate cue type and reason): declined washing face sitting EOB stating a nurse did it earlier Upper Body Bathing: Minimal assistance;Sitting   Lower Body Bathing: Total assistance;Bed level;Sitting/lateral leans   Upper Body Dressing : Minimal assistance   Lower  Body Dressing: Maximal assistance;Total assistance Lower Body Dressing Details (indicate cue type and reason):  max assist for managing R sock and total assist for managing L sock sitting EOB Toilet Transfer:  (not assessed)   Toileting- Clothing Manipulation and Hygiene: Total assistance Toileting - Clothing Manipulation Details (indicate cue type and reason): foley Tub/ Shower Transfer:  (not assessed) Tub/Shower Transfer Details (indicate cue type and reason):  (not assessed) Functional mobility during ADLs:  (EOB only) General ADL Comments: transitioned to EOB only; attempted sit>stand however pt unsuccessful secondary to weakness and fatigue     Vision Baseline Vision/History: Wears glasses Wears Glasses:  (on occasion) Patient Visual Report: No change from baseline              Pertinent Vitals/Pain Pain Assessment: 0-10 Pain Score: 2  Faces Pain Scale: Hurts little more Pain Location: low back, LE  Pain Descriptors / Indicators: Grimacing;Operative site guarding;Constant;Burning Pain Intervention(s): Limited activity within patient's tolerance;Repositioned;Monitored during session     Hand Dominance Right   Extremity/Trunk Assessment Upper Extremity Assessment Upper Extremity Assessment: Overall WFL for tasks assessed;Generalized weakness   Lower Extremity Assessment Lower Extremity Assessment: Generalized weakness (gross limited ROM)   Cervical / Trunk Assessment Cervical / Trunk Assessment: Kyphotic   Communication Communication Communication: No difficulties   Cognition Arousal/Alertness: Awake/alert Behavior During Therapy: Flat affect Overall Cognitive Status: Within Functional Limits for tasks assessed       General Comments: preferred to try things himself first before receiving assistance   General Comments  pt educated on spinal precautions, log rolling, positioning, deep breathing               Home Living Family/patient expects to be discharged to:: Private residence Living Arrangements: Alone Available Help at Discharge: Family;Available  PRN/intermittently Type of Home: House Home Access: Stairs to enter [employer] of Steps: 1 STE with railing Entrance Stairs-Rails: None Home Layout: One level     Bathroom Shower/Tub: Teacher, early years/pre: Standard Bathroom Accessibility: Yes How Accessible: Accessible via walker Home Equipment: [place] - 2 wheels;Kasandra Knudsen - single point   Additional Comments: pt lives alone as his wife just passed away this year. Pt utilizes RW in the home and uses a cane in the community. Son lives in [place] area and he lives in [place]. Pt is Mod I for ADLs/IADLs      Prior Functioning/Environment Level of Independence: Independent with assistive device(s)        Comments: RW used for ambulation in the home, cane for ambulation in community; Mod I for ADLs/IADLs; son assists with more complex tasks (yardwork)        OT Problem List: Decreased strength;Decreased activity tolerance;Decreased range of motion;Impaired balance (sitting and/or standing);Pain      OT Treatment/Interventions: Self-care/ADL training;Therapeutic exercise;Neuromuscular education;Energy conservation;DME and/or AE instruction;Therapeutic activities;Patient/family education    OT Goals(Current goals can be found in the care plan section) Acute Rehab OT Goals Patient Stated Goal: to return home OT Goal Formulation: With patient Time For Goal Achievement: 02/20/20 Potential to Achieve Goals: Fair  OT Frequency: Min 2X/week    AM-PAC OT "6 Clicks" Daily Activity     Outcome Measure Help from another person eating meals?: A Little Help from another person taking care of personal grooming?: A Little Help from another person toileting, which includes using toliet, bedpan, or urinal?: Total (foley) Help from another person bathing (including washing, rinsing, drying)?: A Lot Help from another person to put on and taking  off regular upper body clothing?: A Little Help from another person to put on  and taking off regular lower body clothing?: A Lot 6 Click Score: 14   End of Session Equipment Utilized During Treatment: Gait belt;Rolling walker Nurse Communication: Mobility status  Activity Tolerance: Patient limited by fatigue;Patient limited by pain Patient left: in bed;with call bell/phone within reach;with bed alarm set;with nursing/sitter in room  OT Visit Diagnosis: Unsteadiness on feet (R26.81);Muscle weakness (generalized) (M62.81);Pain Pain - Right/Left:  (back) Pain - part of body:  (back)                Time: 1278-7183 OT Time Calculation (min): 29 min Charges:  OT General Charges $OT Visit: 1 Visit OT Evaluation $OT Eval Low Complexity: 1 Low OT Treatments $Therapeutic Activity: 8-22 mins  Michel Bickers, OTR/L [facility] [phone]   Francesca Jewett 02/06/2020, 7:20 PM

## 2020-02-06 NOTE — Progress Notes (Signed)
[facility] for Infectious Disease  Date of Admission:  01/12/2020     Total days of antibiotics 3         ASSESSMENT:  Mr. Summerson blood cultures from 8/3 have been without growth to date. Disc aspiration specimen have gram positive cocci on gram stain and Staphylococcus aureus on culture. TEE was without evidence of endocarditis. Continue to monitor cultures for clearance of bacteremia and hold any central line/PICC placement until they are cleared. Continue Cefazolin. Will plan 6 weeks of therapy from the date blood cultures are clear.   PLAN:  1. Continue Cefazolin. 2. Monitor blood cultures for clearance of bacteremia.  3. Hold central line/PICC placement pending clearance of cultures. 4. Pain management per primary team.   Principal Problem:   Epidural abscess Active Problems:   Diabetes ([place])   Anemia of chronic disease   Hypertension   Pressure injury of skin   . diltiazem  240 mg Oral Daily  . furosemide  40 mg Oral Daily  . lactose free nutrition  237 mL Oral Daily  . pravastatin  40 mg Oral Daily    SUBJECTIVE:  Afebrile over the last 24 hours and normalized WBC count. Not feeling well today. Back pain with movement. Denies fevers or chills.   No Known Allergies   Review of Systems: Review of Systems  Constitutional: Negative for chills, fever and weight loss.  Respiratory: Negative for cough, shortness of breath and wheezing.   Cardiovascular: Negative for chest pain and leg swelling.  Gastrointestinal: Negative for abdominal pain, constipation, diarrhea, nausea and vomiting.  Musculoskeletal: Positive for back pain.  Skin: Negative for rash.      OBJECTIVE: Vitals:   02/27/2020 1940 02/06/20 0106 02/06/20 0459 02/06/20 0924  BP: (!) 128/50 (!) 138/44 139/62 (!) 149/57  Pulse: 88 63 85 82  Resp: 17 18 18    Temp: 99.8 F (37.7 C) 99 F (37.2 C) 99.1 F (37.3 C)   TempSrc: Oral Oral Oral   SpO2: 91% 92% 90%   Weight:      Height:         Body mass index is 25.73 kg/m.  Physical Exam Constitutional:      General: He is not in acute distress.    Appearance: He is well-developed.     Comments: Lying in bed with head of bed elevated; pleasant.   Cardiovascular:     Rate and Rhythm: Normal rate and regular rhythm.     Heart sounds: Normal heart sounds.  Pulmonary:     Effort: Pulmonary effort is normal.     Breath sounds: Normal breath sounds.  Skin:    General: Skin is warm and dry.  Neurological:     Mental Status: He is alert and oriented to person, place, and time.  Psychiatric:        Behavior: Behavior normal.        Thought Content: Thought content normal.        Judgment: Judgment normal.     Lab Results Lab Results  Component Value Date   WBC 7.3 02/06/2020   HGB 10.1 (L) 02/06/2020   HCT 32.0 (L) 02/06/2020   MCV 102.9 (H) 02/06/2020   PLT 150 02/06/2020    Lab Results  Component Value Date   CREATININE 2.52 (H) 02/06/2020   BUN 65 (H) 02/06/2020   NA 139 02/06/2020   K 3.5 02/06/2020   CL 107 02/06/2020   CO2 20 (L) 02/06/2020  Lab Results  Component Value Date   ALT 12 02/27/2020   AST 16 02/24/2020   ALKPHOS 68 02/21/2020   BILITOT 0.9 03/04/2020     Microbiology: Recent Results (from the past 240 hour(s))  SARS Coronavirus 2 by RT PCR (hospital order, performed in [facility] hospital lab) Nasopharyngeal Nasopharyngeal Swab     Status: None   Collection Time: 02/03/20  2:27 PM   Specimen: Nasopharyngeal Swab  Result Value Ref Range Status   SARS Coronavirus 2 NEGATIVE NEGATIVE Final    Comment: (NOTE) SARS-CoV-2 target nucleic acids are NOT DETECTED.  The SARS-CoV-2 RNA is generally detectable in upper and lower respiratory specimens during the acute phase of infection. The lowest concentration of SARS-CoV-2 viral copies this assay can detect is 250 copies / mL. A negative result does not preclude SARS-CoV-2 infection and should not be used as the sole basis for  treatment or other patient management decisions.  A negative result may occur with improper specimen collection / handling, submission of specimen other than nasopharyngeal swab, presence of viral mutation(s) within the areas targeted by this assay, and inadequate number of viral copies (<250 copies / mL). A negative result must be combined with clinical observations, patient history, and epidemiological information.  Fact Sheet for Patients:   StrictlyIdeas.no  Fact Sheet for Healthcare Providers: BankingDealers.co.za  This test is not yet approved or  cleared by the [place] FDA and has been authorized for detection and/or diagnosis of SARS-CoV-2 by FDA under an Emergency Use Authorization (EUA).  This EUA will remain in effect (meaning this test can be used) for the duration of the COVID-19 declaration under Section 564(b)(1) of the Act, 21 U.S.C. section 360bbb-3(b)(1), unless the authorization is terminated or revoked sooner.  Performed at [facility], [place] [address] [place], [place] [address]   Blood culture (routine x 2)     Status: Abnormal   Collection Time: 02/03/20  4:41 PM   Specimen: BLOOD LEFT HAND  Result Value Ref Range Status   Specimen Description BLOOD LEFT HAND  Final   Special Requests   Final    BOTTLES DRAWN AEROBIC AND ANAEROBIC Blood Culture adequate volume   Culture  Setup Time   Final    GRAM POSITIVE COCCI IN CLUSTERS IN BOTH AEROBIC AND ANAEROBIC BOTTLES Organism ID to follow CRITICAL RESULT CALLED TO, READ BACK BY AND VERIFIED WITH: A. Rogers Blocker PharmD 8:50 02/04/20 ([email or web address]) Performed at [facility], [place] [address] [place], [place] [address]    Culture STAPHYLOCOCCUS AUREUS (A)  Final   Report Status 02/06/2020 FINAL  Final   Organism ID, Bacteria STAPHYLOCOCCUS AUREUS  Final      Susceptibility   Staphylococcus aureus - MIC*    CIPROFLOXACIN <=0.5 SENSITIVE Sensitive      ERYTHROMYCIN >=8 RESISTANT Resistant     GENTAMICIN <=0.5 SENSITIVE Sensitive     OXACILLIN 1 SENSITIVE Sensitive     TETRACYCLINE <=1 SENSITIVE Sensitive     VANCOMYCIN 1 SENSITIVE Sensitive     TRIMETH/SULFA <=10 SENSITIVE Sensitive     CLINDAMYCIN <=0.25 SENSITIVE Sensitive     RIFAMPIN <=0.5 SENSITIVE Sensitive     Inducible Clindamycin NEGATIVE Sensitive     * STAPHYLOCOCCUS AUREUS  Blood Culture ID Panel (Reflexed)     Status: Abnormal   Collection Time: 02/03/20  4:41 PM  Result Value Ref Range Status   Enterococcus species NOT DETECTED NOT DETECTED Final   Listeria monocytogenes NOT DETECTED NOT DETECTED  Final   Staphylococcus species DETECTED (A) NOT DETECTED Final    Comment: CRITICAL RESULT CALLED TO, READ BACK BY AND VERIFIED WITH: A. Rogers Blocker PharmD 8:50 02/04/20 ([email or web address])    Staphylococcus aureus (BCID) DETECTED (A) NOT DETECTED Final    Comment: Methicillin (oxacillin) susceptible Staphylococcus aureus (MSSA). Preferred therapy is anti staphylococcal beta lactam antibiotic (Cefazolin or Nafcillin), unless clinically contraindicated. CRITICAL RESULT CALLED TO, READ BACK BY AND VERIFIED WITH: A. Rogers Blocker PharmD 8:50 02/04/20 ([email or web address])    Methicillin resistance NOT DETECTED NOT DETECTED Final   Streptococcus species NOT DETECTED NOT DETECTED Final   Streptococcus agalactiae NOT DETECTED NOT DETECTED Final   Streptococcus pneumoniae NOT DETECTED NOT DETECTED Final   Streptococcus pyogenes NOT DETECTED NOT DETECTED Final   Acinetobacter baumannii NOT DETECTED NOT DETECTED Final   Enterobacteriaceae species NOT DETECTED NOT DETECTED Final   Enterobacter cloacae complex NOT DETECTED NOT DETECTED Final   Escherichia coli NOT DETECTED NOT DETECTED Final   Klebsiella oxytoca NOT DETECTED NOT DETECTED Final   Klebsiella pneumoniae NOT DETECTED NOT DETECTED Final   Proteus species NOT DETECTED NOT DETECTED Final   Serratia marcescens NOT DETECTED NOT DETECTED Final   Haemophilus  influenzae NOT DETECTED NOT DETECTED Final   Neisseria meningitidis NOT DETECTED NOT DETECTED Final   Pseudomonas aeruginosa NOT DETECTED NOT DETECTED Final   Candida albicans NOT DETECTED NOT DETECTED Final   Candida glabrata NOT DETECTED NOT DETECTED Final   Candida krusei NOT DETECTED NOT DETECTED Final   Candida parapsilosis NOT DETECTED NOT DETECTED Final   Candida tropicalis NOT DETECTED NOT DETECTED Final    Comment: Performed at [facility], [place] [address] [place], [place] [address]  Blood culture (routine x 2)     Status: Abnormal   Collection Time: 02/03/20  4:45 PM   Specimen: BLOOD RIGHT HAND  Result Value Ref Range Status   Specimen Description BLOOD RIGHT HAND  Final   Special Requests   Final    BOTTLES DRAWN AEROBIC AND ANAEROBIC Blood Culture adequate volume   Culture  Setup Time   Final    GRAM POSITIVE COCCI IN CLUSTERS IN BOTH AEROBIC AND ANAEROBIC BOTTLES CRITICAL VALUE NOTED.  VALUE IS CONSISTENT WITH PREVIOUSLY REPORTED AND CALLED VALUE.    Culture (A)  Final    STAPHYLOCOCCUS AUREUS SUSCEPTIBILITIES PERFORMED ON PREVIOUS CULTURE WITHIN THE LAST 5 DAYS. Performed at [facility], [place] [address] [place], [place] [address]    Report Status 02/06/2020 FINAL  Final  Culture, blood (routine x 2)     Status: None (Preliminary result)   Collection Time: 02/29/2020  5:58 AM   Specimen: BLOOD  Result Value Ref Range Status   Specimen Description BLOOD RIGHT HAND  Final   Special Requests   Final    BOTTLES DRAWN AEROBIC ONLY Blood Culture adequate volume   Culture   Final    NO GROWTH 1 DAY Performed at [facility], [place] [address], [place], [place] [address]    Report Status PENDING  Incomplete  Culture, blood (routine x 2)     Status: None (Preliminary result)   Collection Time: 02/03/2020  6:04 AM   Specimen: BLOOD  Result Value Ref Range Status   Specimen Description BLOOD LEFT HAND  Final   Special Requests   Final    BOTTLES DRAWN  AEROBIC ONLY Blood Culture adequate volume   Culture   Final    NO GROWTH 1 DAY Performed at [facility], [place] [address], [place], [place] [address]    Report Status PENDING  Incomplete  Aerobic/Anaerobic Culture (surgical/deep wound)     Status: None (Preliminary result)   Collection Time: 02/27/2020 11:35 AM   Specimen: Abscess; Fine Needle Aspirate  Result Value Ref Range Status   Specimen Description ABSCESS  Final   Special Requests L3 L4 DISC  Final   Gram Stain   Final    ABUNDANT WBC PRESENT, PREDOMINANTLY PMN FEW GRAM POSITIVE COCCI    Culture   Final    RARE STAPHYLOCOCCUS AUREUS SUSCEPTIBILITIES TO FOLLOW Performed at [facility], [address], [place], [place] [address]    Report Status PENDING  Incomplete     Terri Piedra, [facility]  02/06/2020  12:52 PM

## 2020-02-06 NOTE — Social Work (Signed)
HIPAA compliant message left for pt son Deonta Bomberger at [phone].  Westley Hummer, MSW, [place] [facility]

## 2020-02-06 NOTE — [place] FL2 (Addendum)
[place] MEDICAID FL2 LEVEL OF CARE SCREENING TOOL     IDENTIFICATION  Patient Name: [name] Birthdate: 11/19/1927 Sex: male Admission Date (Current Location): 01/04/2020  [facility] and [place] Number:  Herbalist and Address:  [facility], [place] [address] [place], [place] [address]      Provider Number: [id number]  Attending Physician Name and Address:  Nolberto Hanlon, MD  Relative Name and Phone Number:       Current Level of Care: Hospital Recommended Level of Care: [place] Prior Approval Number:    Date Approved/Denied:   PASRR Number:   [id number]  Discharge Plan: SNF    Current Diagnoses: Patient Active Problem List   Diagnosis Date Noted   Pressure injury of skin 02/04/2020   Epidural abscess 02/03/2020   Hypertension    Pain due to onychomycosis of toenails of both feet 01/10/2019   Blister of foot with infection 01/10/2019   DM foot ulcer ([place]) 01/10/2019   Anemia of chronic disease 06/21/2018   Diabetes ([place]) 03/29/2013   Gastrointestinal ulcer     Orientation RESPIRATION BLADDER Height & Weight     Time, Situation, Place, Self   Normal (Room Air) Continent Weight: 195 lb (88.5 kg) Height:  6\' 1"  (185.4 cm)  BEHAVIORAL SYMPTOMS/MOOD NEUROLOGICAL BOWEL NUTRITION STATUS      Continent Diet (see discharge summary)  AMBULATORY STATUS COMMUNICATION OF NEEDS Skin   Extensive Assist Verbally PU Stage and Appropriate Care PU Stage 1 Dressing:  (foam dressing every 3 days) PU Stage 2 Dressing:  (foam dressing every 3 days)   PU on R (stage 2) and L (stage 1) on buttocks               Personal Care Assistance Level of Assistance  Bathing, Feeding, Dressing Bathing Assistance: Maximum assistance Feeding assistance: Independent Dressing Assistance: Maximum assistance     Functional Limitations Info  Hearing, Sight, Speech Sight Info: Adequate Hearing Info: Adequate Speech Info:  Adequate    SPECIAL CARE FACTORS FREQUENCY  PT (By licensed PT), OT (By licensed OT)     PT Frequency: 5x week OT Frequency: 5x week            Contractures Contractures Info: Not present    Additional Factors Info  Code Status, Allergies Code Status Info: DNR Allergies Info: No Known Allergies           Current Medications (02/06/2020):  This is the current hospital active medication list Current Facility-Administered Medications  Medication Dose Route Frequency Provider Last Rate Last Admin   0.9 %  sodium chloride infusion   Intravenous Continuous Skeet Latch, MD 100 mL/hr at 02/06/20 0648 Rate Verify at 02/06/20 0648   acetaminophen (TYLENOL) tablet 650 mg  650 mg Oral Q6H PRN Skeet Latch, MD   650 mg at 02/19/2020 0456   Or   acetaminophen (TYLENOL) suppository 650 mg  650 mg Rectal Q6H PRN Skeet Latch, MD       bisacodyl (DULCOLAX) EC tablet 5 mg  5 mg Oral Daily PRN Skeet Latch, MD       ceFAZolin (ANCEF) IVPB 2g/100 mL premix  2 g Intravenous Q12H Skeet Latch, MD 200 mL/hr at 02/06/20 0928 2 g at 02/06/20 0928   diltiazem (CARDIZEM CD) 24 hr capsule 240 mg  240 mg Oral Daily Skeet Latch, MD   240 mg at 02/06/20 0925   furosemide (LASIX) tablet 40 mg  40 mg Oral Daily  Skipper Cliche A, MD   40 mg at 02/06/20 0925   heparin ADULT infusion 100 units/mL (25000 units/237mL sodium chloride 0.45%)  1,700 Units/hr Intravenous Continuous Nolberto Hanlon, MD 17 mL/hr at 02/06/20 0923 1,700 Units/hr at 02/06/20 0923   HYDROmorphone (DILAUDID) injection 0.5-1 mg  0.5-1 mg Intravenous Q2H PRN Skeet Latch, MD       lactose free nutrition (Boost) liquid 237 mL  237 mL Oral Daily Skeet Latch, MD   237 mL at 02/06/20 1610   oxyCODONE (Oxy IR/ROXICODONE) immediate release tablet 5 mg  5 mg Oral Q4H PRN Skeet Latch, MD       polyethylene glycol (MIRALAX / GLYCOLAX) packet 17 g  17 g Oral Daily PRN Skeet Latch, MD        pravastatin (PRAVACHOL) tablet 40 mg  40 mg Oral Daily Skeet Latch, MD   40 mg at 02/06/20 9604     Discharge Medications: Please see discharge summary for a list of discharge medications.  Relevant Imaging Results:  Relevant Lab Results:   Additional Information [id number] [place], [place]

## 2020-02-07 ENCOUNTER — Inpatient Hospital Stay (HOSPITAL_COMMUNITY): Payer: Medicare Other

## 2020-02-07 DIAGNOSIS — M4626 Osteomyelitis of vertebra, lumbar region: Secondary | ICD-10-CM

## 2020-02-07 DIAGNOSIS — R110 Nausea: Secondary | ICD-10-CM

## 2020-02-07 DIAGNOSIS — D638 Anemia in other chronic diseases classified elsewhere: Secondary | ICD-10-CM | POA: Diagnosis not present

## 2020-02-07 DIAGNOSIS — B9561 Methicillin susceptible Staphylococcus aureus infection as the cause of diseases classified elsewhere: Secondary | ICD-10-CM | POA: Diagnosis not present

## 2020-02-07 DIAGNOSIS — R7881 Bacteremia: Secondary | ICD-10-CM | POA: Diagnosis not present

## 2020-02-07 DIAGNOSIS — G062 Extradural and subdural abscess, unspecified: Secondary | ICD-10-CM | POA: Diagnosis not present

## 2020-02-07 DIAGNOSIS — I824Z2 Acute embolism and thrombosis of unspecified deep veins of left distal lower extremity: Secondary | ICD-10-CM

## 2020-02-07 DIAGNOSIS — M549 Dorsalgia, unspecified: Secondary | ICD-10-CM

## 2020-02-07 DIAGNOSIS — R5381 Other malaise: Secondary | ICD-10-CM

## 2020-02-07 DIAGNOSIS — M4646 Discitis, unspecified, lumbar region: Secondary | ICD-10-CM

## 2020-02-07 LAB — BASIC METABOLIC PANEL
Anion gap: 14 (ref 5–15)
BUN: 64 mg/dL — ABNORMAL HIGH (ref 8–23)
CO2: 22 mmol/L (ref 22–32)
Calcium: 8.5 mg/dL — ABNORMAL LOW (ref 8.9–10.3)
Chloride: 105 mmol/L (ref 98–111)
Creatinine, Ser: 2.1 mg/dL — ABNORMAL HIGH (ref 0.61–1.24)
GFR calc Af Amer: 31 mL/min — ABNORMAL LOW (ref >60)
GFR calc non Af Amer: 27 mL/min — ABNORMAL LOW (ref >60)
Glucose, Bld: 146 mg/dL — ABNORMAL HIGH (ref 70–99)
Potassium: 3.7 mmol/L (ref 3.5–5.1)
Sodium: 141 mmol/L (ref 135–145)

## 2020-02-07 LAB — CBC
HCT: 31.8 % — ABNORMAL LOW (ref 39.0–52.0)
Hemoglobin: 10.3 g/dL — ABNORMAL LOW (ref 13.0–17.0)
MCH: 33.4 pg (ref 26.0–34.0)
MCHC: 32.4 g/dL (ref 30.0–36.0)
MCV: 103.2 fL — ABNORMAL HIGH (ref 80.0–100.0)
Platelets: 149 10*3/uL — ABNORMAL LOW (ref 150–400)
RBC: 3.08 MIL/uL — ABNORMAL LOW (ref 4.22–5.81)
RDW: 15.3 % (ref 11.5–15.5)
WBC: 7.4 10*3/uL (ref 4.0–10.5)
nRBC: 0 % (ref 0.0–0.2)

## 2020-02-07 LAB — TROPONIN I (HIGH SENSITIVITY): Troponin I (High Sensitivity): 33 ng/L — ABNORMAL HIGH (ref <18)

## 2020-02-07 LAB — HEPARIN LEVEL (UNFRACTIONATED): Heparin Unfractionated: 0.41 IU/mL (ref 0.30–0.70)

## 2020-02-07 MED ORDER — BENZOCAINE 10 % MT GEL
Freq: Three times a day (TID) | OROMUCOSAL | Status: DC
  Administered 2020-02-08: 1 via OROMUCOSAL
  Filled 2020-02-07: qty 9

## 2020-02-07 NOTE — Progress Notes (Signed)
ANTICOAGULATION CONSULT NOTE - Follow Up Consult  Pharmacy Consult for Heparin Indication: acute DVT  No Known Allergies  Patient Measurements: Height: 6\' 1"  (185.4 cm) Weight: 88.5 kg (195 lb) IBW/kg (Calculated) : 79.9 Heparin Dosing Weight: 88.5 kg  Vital Signs: Temp: 98.8 F (37.1 C) (08/05 0528) Temp Source: Oral (08/05 0528) BP: 138/55 (08/05 0528) Pulse Rate: 78 (08/05 0528)  Labs: Recent Labs    03/03/2020 0558 02/14/2020 0558 02/06/20 0334 02/06/20 0429 02/06/20 1840 02/07/20 0523 02/07/20 0524  HGB 10.3*   < > 10.1*  --   --  10.3*  --   HCT 33.6*  --  32.0*  --   --  31.8*  --   PLT 149*  --  150  --   --  149*  --   HEPARINUNFRC 0.40   < > 0.29*  --  0.37  --  0.41  CREATININE 2.59*  --   --  2.52*  --  2.10*  --    < > = values in this interval not displayed.    Estimated Creatinine Clearance: 27 mL/min (A) (by C-G formula based on SCr of 2.1 mg/dL (H)).   Medical History: Past Medical History:  Diagnosis Date  . Anemia   . Callus   . Diabetes mellitus without complication ([place])   . Gastrointestinal ulcer   . Hypertension    Assessment: 86 y.o. male presenting with back pain and found to have discitis/osteomyelitis, MSSA bacteremia, and new LLE DVT on 02/03/20. No PTA anticoagulation. Pharmacy has been consulted for IV heparin dosing.   Today, heparin level is therapeutic at 0.41 after rate increase yesterday. Hgb and platelets stable from previous. No overt bleeding noted. No issues with the infusion overnight per RN.  Goal of Therapy:  Heparin level 0.3-0.7 units/ml Monitor platelets by anticoagulation protocol: Yes  Plan:  Continue heparin infusion at 1700 units/hr Monitor CBC, daily heparin level  Continue to monitor for signs/symptoms of bleeding F/u transition to oral anticoagulant when no additional procedures planned    Brendolyn Patty, PharmD Clinical Pharmacist  02/07/2020   9:10 AM   Please check AMION for all [place] phone  numbers After 10:00 PM, call the [place] [phone]

## 2020-02-07 NOTE — TOC Initial Note (Signed)
Transition of Care [facility]) - Initial/Assessment Note    Patient Details  Name: [name] MRN: [id number] Date of Birth: Feb 22, 1928  [facility] CM/SW Contact:    Alexander Mt, LCSW Phone Number:  02/07/2020, 11:20 AM  Clinical Narrative:                 CSW spoke with pt at bedside. Introduced self, role, reason for visit. Pt states he doesn't feel great this morning but is amenable to speaking with this Probation officer. CSW confirmed home address and PCP on face sheet. Pt was independent relatively before admission and had no issues getting to appointments or obtaining meds etc. We discussed recommendations; he is familiar with SNF (his family and spouse have been before). He would like to stay in [place] and wants to not go to far. He mentions [facility] as a SNF of interest. CSW explained referral process and got permission to attempt reaching his son again. Referral sent/fl2 done. Pending continued medical work up.   Expected Discharge Plan: Skilled Nursing Facility Barriers to Discharge: Continued Medical Work up   Patient Goals and CMS Choice Patient states their goals for this hospitalization and ongoing recovery are:: get stronger, have help before going home CMS Medicare.gov Compare Post Acute Care list provided to:: Patient Choice offered to / list presented to : Patient, Adult Children  Expected Discharge Plan and Services Expected Discharge Plan: [place] In-house Referral: Clinical Social Work Discharge Planning Services: CM Consult Post Acute Care Choice: [place] Living arrangements for the past 2 months: [place]   Prior Living Arrangements/Services Living arrangements for the past 2 months: Single Family Home Lives with:: Self Patient language and need for interpreter reviewed:: Yes (no needs) Do you feel safe going back to the place where you live?: Yes      Need for Family Participation in Patient Care:  Yes (Comment) (assistance w/ daily cares) Care giver support system in place?: Yes (comment) (pt son when able) Current home services: DME Criminal Activity/Legal Involvement Pertinent to Current Situation/Hospitalization: No - Comment as needed  Activities of Daily Living Home Assistive Devices/Equipment: (P) Walker (specify type) ADL Screening (condition at time of admission) Patient's cognitive ability adequate to safely complete daily activities?: (P) Yes Is the patient deaf or have difficulty hearing?: (P) No Does the patient have difficulty seeing, even when wearing glasses/contacts?: (P) No Does the patient have difficulty concentrating, remembering, or making decisions?: (P) No Patient able to express need for assistance with ADLs?: (P) Yes Does the patient have difficulty dressing or bathing?: (P) No Independently performs ADLs?: (P) Yes (appropriate for developmental age) Does the patient have difficulty walking or climbing stairs?: (P) Yes Weakness of Legs: (P) Both Weakness of Arms/Hands: (P) None  Permission Sought/Granted Permission sought to share information with : Facility Sport and exercise psychologist, Family Supports Permission granted to share information with : Yes, Verbal Permission Granted  Share Information with NAME: Mikie Misner  Permission granted to share info w AGENCY: SNFs  Permission granted to share info w Relationship: son  Permission granted to share info w Contact Information: [phone]  Emotional Assessment Appearance:: Appears stated age Attitude/Demeanor/Rapport: Engaged, Gracious Affect (typically observed): Accepting, Adaptable, Appropriate, Pleasant, Overwhelmed Orientation: : Oriented to  Time, Oriented to Place, Oriented to Self, Oriented to Situation Alcohol / Substance Use: Not Applicable Psych Involvement: No (comment)  Admission diagnosis:  Discitis of lumbar region [M46.46] Epidural abscess [G06.2] 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[place])  [  M46.26] Acute deep vein thrombosis (DVT) of distal vein of left lower extremity ([place]) [I82.4Z2] Patient Active Problem List   Diagnosis Date Noted   Acute deep vein thrombosis (DVT) of distal vein of left lower extremity (HCC)    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HCC)    Pressure injury of skin 02/04/2020   Epidural abscess 02/03/2020   Hypertension    Pain due to onychomycosis of toenails of both feet 01/10/2019   Blister of foot with infection 01/10/2019   DM foot ulcer ([place]) 01/10/2019   Anemia of chronic disease 06/21/2018   Diabetes ([place]) 03/29/2013   Gastrointestinal ulcer    PCP:  Haywood Pao, MD Pharmacy:   [place] ([place]), [place] - [address] [place]) [place] [address] Phone: [phone] Fax: [phone]   Readmission Risk Interventions Readmission Risk Prevention Plan 02/07/2020  Transportation Screening Complete  PCP or Specialist Appt within 5-7 Days Not Complete  Not Complete comments plan for SNF  Home Care Screening Complete  Medication Review (RN CM) Referral to Pharmacy  Some recent data might be hidden

## 2020-02-07 NOTE — Progress Notes (Signed)
[facility] for Infectious Disease  Date of Admission:  01/08/2020     Total days of antibiotics 4         ASSESSMENT:  Mr. [name] continues to have generalized malaise and back pain with movement. Aspiration cultures growing MSSA. Blood cultures from 8/3 without growth to date. Will recheck blood cultures today and consider PICC line placement tomorrow. Continue pain management and mobilization per primary team. Continue current dose of Cefazolin.   PLAN:  1. Continue Cefazolin.  2. Recheck blood cultures.  3. Hold PICC line placement.  4. Pain management and mobilization per primary team.  5. Monitor previous blood culture for clearance of bacteremia.   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 benzocaine   Mouth/Throat TID AC  . diltiazem  240 mg Oral Daily  . furosemide  40 mg Oral Daily  . lactose free nutrition  237 mL Oral Daily  . pravastatin  40 mg Oral Daily    SUBJECTIVE:  Afebrile overnight with no acute events. Continues to not feel well today. General malaise with back pain with movement.   No Known Allergies   Review of Systems: Review of Systems  Constitutional: Positive for malaise/fatigue. Negative for chills, fever and weight loss.  Respiratory: Negative for cough, shortness of breath and wheezing.   Cardiovascular: Negative for chest pain and leg swelling.  Gastrointestinal: Negative for abdominal pain, constipation, diarrhea, nausea and vomiting.  Musculoskeletal: Positive for back pain.  Skin: Negative for rash.      OBJECTIVE: Vitals:   02/06/20 0924 02/06/20 1407 02/06/20 2040 02/07/20 0528  BP: (!) 149/57 (!) 157/57 (!) 148/56 (!) 138/55  Pulse: 82 84 95 78  Resp:   17 17  Temp:  98.4 F (36.9 C) 98.5 F  (36.9 C) 98.8 F (37.1 C)  TempSrc:  Oral Oral Oral  SpO2:  93% 94% 93%  Weight:      Height:       Body mass index is 25.73 kg/m.  Physical Exam Constitutional:      General: He is not in acute distress.    Appearance: He is well-developed.  Cardiovascular:     Rate and Rhythm: Normal rate and regular rhythm.     Heart sounds: Normal heart sounds.  Pulmonary:     Effort: Pulmonary effort is normal.     Breath sounds: Normal breath sounds.  Skin:    General: Skin is warm and dry.  Neurological:     Mental Status: He is alert and oriented to person, place, and time.  Psychiatric:        Behavior: Behavior normal.        Thought Content: Thought content normal.        Judgment: Judgment normal.     Lab Results Lab Results  Component Value Date   WBC 7.4 02/07/2020   HGB 10.3 (L) 02/07/2020   HCT 31.8 (L) 02/07/2020   MCV 103.2 (H) 02/07/2020   PLT 149 (L) 02/07/2020    Lab Results  Component Value Date   CREATININE 2.10 (H) 02/07/2020   BUN 64 (H) 02/07/2020   NA 141 02/07/2020   K 3.7 02/07/2020   CL 105 02/07/2020   CO2 22 02/07/2020  Lab Results  Component Value Date   ALT 12 02/15/2020   AST 16 02/19/2020   ALKPHOS 68 03/02/2020   BILITOT 0.9 02/26/2020     Microbiology: Recent Results (from the past 240 hour(s))  SARS Coronavirus 2 by RT PCR (hospital order, performed in [facility] hospital lab) Nasopharyngeal Nasopharyngeal Swab     Status: None   Collection Time: 02/03/20  2:27 PM   Specimen: Nasopharyngeal Swab  Result Value Ref Range Status   SARS Coronavirus 2 NEGATIVE NEGATIVE Final    Comment: (NOTE) SARS-CoV-2 target nucleic acids are NOT DETECTED.  The SARS-CoV-2 RNA is generally detectable in upper and lower respiratory specimens during the acute phase of infection. The lowest concentration of SARS-CoV-2 viral copies this assay can detect is 250 copies / mL. A negative result does not preclude SARS-CoV-2 infection and should not  be used as the sole basis for treatment or other patient management decisions.  A negative result may occur with improper specimen collection / handling, submission of specimen other than nasopharyngeal swab, presence of viral mutation(s) within the areas targeted by this assay, and inadequate number of viral copies (<250 copies / mL). A negative result must be combined with clinical observations, patient history, and epidemiological information.  Fact Sheet for Patients:   StrictlyIdeas.no  Fact Sheet for Healthcare Providers: BankingDealers.co.za  This test is not yet approved or  cleared by the [place] FDA and has been authorized for detection and/or diagnosis of SARS-CoV-2 by FDA under an Emergency Use Authorization (EUA).  This EUA will remain in effect (meaning this test can be used) for the duration of the COVID-19 declaration under Section 564(b)(1) of the Act, 21 U.S.C. section 360bbb-3(b)(1), unless the authorization is terminated or revoked sooner.  Performed at [facility], [place] [address] [place], [place] [address]   Blood culture (routine x 2)     Status: Abnormal   Collection Time: 02/03/20  4:41 PM   Specimen: BLOOD LEFT HAND  Result Value Ref Range Status   Specimen Description BLOOD LEFT HAND  Final   Special Requests   Final    BOTTLES DRAWN AEROBIC AND ANAEROBIC Blood Culture adequate volume   Culture  Setup Time   Final    GRAM POSITIVE COCCI IN CLUSTERS IN BOTH AEROBIC AND ANAEROBIC BOTTLES Organism ID to follow CRITICAL RESULT CALLED TO, READ BACK BY AND VERIFIED WITH: A. Rogers Blocker PharmD 8:50 02/04/20 ([email or web address]) Performed at [facility], [place] [address] [place], [place] [address]    Culture STAPHYLOCOCCUS AUREUS (A)  Final   Report Status 02/06/2020 FINAL  Final   Organism ID, Bacteria STAPHYLOCOCCUS AUREUS  Final      Susceptibility   Staphylococcus aureus - MIC*    CIPROFLOXACIN <=0.5  SENSITIVE Sensitive     ERYTHROMYCIN >=8 RESISTANT Resistant     GENTAMICIN <=0.5 SENSITIVE Sensitive     OXACILLIN 1 SENSITIVE Sensitive     TETRACYCLINE <=1 SENSITIVE Sensitive     VANCOMYCIN 1 SENSITIVE Sensitive     TRIMETH/SULFA <=10 SENSITIVE Sensitive     CLINDAMYCIN <=0.25 SENSITIVE Sensitive     RIFAMPIN <=0.5 SENSITIVE Sensitive     Inducible Clindamycin NEGATIVE Sensitive     * STAPHYLOCOCCUS AUREUS  Blood Culture ID Panel (Reflexed)     Status: Abnormal   Collection Time: 02/03/20  4:41 PM  Result Value Ref Range Status   Enterococcus species NOT DETECTED NOT DETECTED Final   Listeria monocytogenes NOT DETECTED NOT DETECTED  Final   Staphylococcus species DETECTED (A) NOT DETECTED Final    Comment: CRITICAL RESULT CALLED TO, READ BACK BY AND VERIFIED WITH: A. Rogers Blocker PharmD 8:50 02/04/20 ([email or web address])    Staphylococcus aureus (BCID) DETECTED (A) NOT DETECTED Final    Comment: Methicillin (oxacillin) susceptible Staphylococcus aureus (MSSA). Preferred therapy is anti staphylococcal beta lactam antibiotic (Cefazolin or Nafcillin), unless clinically contraindicated. CRITICAL RESULT CALLED TO, READ BACK BY AND VERIFIED WITH: A. Rogers Blocker PharmD 8:50 02/04/20 ([email or web address])    Methicillin resistance NOT DETECTED NOT DETECTED Final   Streptococcus species NOT DETECTED NOT DETECTED Final   Streptococcus agalactiae NOT DETECTED NOT DETECTED Final   Streptococcus pneumoniae NOT DETECTED NOT DETECTED Final   Streptococcus pyogenes NOT DETECTED NOT DETECTED Final   Acinetobacter baumannii NOT DETECTED NOT DETECTED Final   Enterobacteriaceae species NOT DETECTED NOT DETECTED Final   Enterobacter cloacae complex NOT DETECTED NOT DETECTED Final   Escherichia coli NOT DETECTED NOT DETECTED Final   Klebsiella oxytoca NOT DETECTED NOT DETECTED Final   Klebsiella pneumoniae NOT DETECTED NOT DETECTED Final   Proteus species NOT DETECTED NOT DETECTED Final   Serratia marcescens NOT DETECTED NOT DETECTED  Final   Haemophilus influenzae NOT DETECTED NOT DETECTED Final   Neisseria meningitidis NOT DETECTED NOT DETECTED Final   Pseudomonas aeruginosa NOT DETECTED NOT DETECTED Final   Candida albicans NOT DETECTED NOT DETECTED Final   Candida glabrata NOT DETECTED NOT DETECTED Final   Candida krusei NOT DETECTED NOT DETECTED Final   Candida parapsilosis NOT DETECTED NOT DETECTED Final   Candida tropicalis NOT DETECTED NOT DETECTED Final    Comment: Performed at [facility], [place] [address], [place], [place] [address]  Blood culture (routine x 2)     Status: Abnormal   Collection Time: 02/03/20  4:45 PM   Specimen: BLOOD RIGHT HAND  Result Value Ref Range Status   Specimen Description BLOOD RIGHT HAND  Final   Special Requests   Final    BOTTLES DRAWN AEROBIC AND ANAEROBIC Blood Culture adequate volume   Culture  Setup Time   Final    GRAM POSITIVE COCCI IN CLUSTERS IN BOTH AEROBIC AND ANAEROBIC BOTTLES CRITICAL VALUE NOTED.  VALUE IS CONSISTENT WITH PREVIOUSLY REPORTED AND CALLED VALUE.    Culture (A)  Final    STAPHYLOCOCCUS AUREUS SUSCEPTIBILITIES PERFORMED ON PREVIOUS CULTURE WITHIN THE LAST 5 DAYS. Performed at [facility], [place] [address], [place], [place] [address]    Report Status 02/06/2020 FINAL  Final  Culture, blood (routine x 2)     Status: None (Preliminary result)   Collection Time: 02/28/2020  5:58 AM   Specimen: BLOOD  Result Value Ref Range Status   Specimen Description BLOOD RIGHT HAND  Final   Special Requests   Final    BOTTLES DRAWN AEROBIC ONLY Blood Culture adequate volume   Culture   Final    NO GROWTH 1 DAY Performed at [facility], [place] [address] [place], [place] [address]    Report Status PENDING  Incomplete  Culture, blood (routine x 2)     Status: None (Preliminary result)   Collection Time: 02/16/2020  6:04 AM   Specimen: BLOOD  Result Value Ref Range Status   Specimen Description BLOOD LEFT HAND  Final   Special Requests   Final      BOTTLES DRAWN AEROBIC ONLY Blood Culture adequate volume   Culture   Final    NO GROWTH 1 DAY Performed at [facility], [place] [address] [place], [place] [address]    Report Status PENDING  Incomplete  Aerobic/Anaerobic Culture (surgical/deep wound)     Status: None (Preliminary result)   Collection Time: 02/23/2020 11:35 AM   Specimen: Abscess; Fine Needle Aspirate  Result Value Ref Range Status   Specimen Description ABSCESS  Final   Special Requests L3 L4 DISC  Final   Gram Stain   Final    ABUNDANT WBC PRESENT, PREDOMINANTLY PMN FEW GRAM POSITIVE COCCI Performed at [facility], [address], [place], [place] [address]    Culture RARE STAPHYLOCOCCUS AUREUS  Final   Report Status PENDING  Incomplete   Organism ID, Bacteria STAPHYLOCOCCUS AUREUS  Final      Susceptibility   Staphylococcus aureus - MIC*    CIPROFLOXACIN <=0.5 SENSITIVE Sensitive     ERYTHROMYCIN >=8 RESISTANT Resistant     GENTAMICIN <=0.5 SENSITIVE Sensitive     OXACILLIN 1 SENSITIVE Sensitive     TETRACYCLINE <=1 SENSITIVE Sensitive     VANCOMYCIN <=0.5 SENSITIVE Sensitive     TRIMETH/SULFA <=10 SENSITIVE Sensitive     CLINDAMYCIN <=0.25 SENSITIVE Sensitive     RIFAMPIN <=0.5 SENSITIVE Sensitive     Inducible Clindamycin NEGATIVE Sensitive     * RARE STAPHYLOCOCCUS AUREUS  Acid Fast Smear (AFB)     Status: None   Collection Time: 02/20/2020 11:35 AM  Result Value Ref Range Status   AFB Specimen Processing Concentration  Final   Acid Fast Smear Negative  Final    Comment: (NOTE) Performed At: [facility] [place], [place] [id number] Rush Farmer MD [id number]    Source (AFB) ABSCESS  Final    Comment: L3 L4 DISC Performed at [facility], [place] [address] [place], [place] [address]      Terri Piedra, [facility]  02/07/2020  11:46 AM

## 2020-02-07 NOTE — Progress Notes (Signed)
PROGRESS NOTE    [name]  MLY:[id number] DOB: [birth year] DOA: 01/11/2020 PCP: Haywood Pao, MD    Brief Narrative:  [name] Gregoryis an 86 y.o.malewith HTN, DM 2 who was in his usual state of health until 2:00 PM 01/06/2020 when he noted sudden onset of back pain associated with lower extremity weakness.   Patient reports of generalized weakness, probably due to his low iron, he gets IV iron with his nephrologist twice a month.  Patient subsequently had acute lower extremity weakness, and back pain unable to ambulate  ED evaluation: He underwent Doppler study which did show anacute DVT. Patient underwent an MRI L-spine which found osteomyelitis/discitis at the L2-3 level with an epidural abscess measuring 9.2 cm extending down into the bilateral psoas muscles.   02/04/2020 -patient hemodynamically stable exception of a T-max of 101.4, 3 out of 4 blood culture positive for staph.   -Patient was not on any antibiotics, no notes from consultants No consultation to IR or IV was placed. Neurosurgery/infectious disease, IR, pharmacy were all consulted. Case discussed with cardiology regarding TEE Patient status was discussed in detail with patient and his son at bedside.  02/10/2020 -T-max 103.1 overnight.. Otherwise hemodynamically stable Status post TEE this morning-tolerated well -reported no obvious agitation Status post L3-4 fluid drainage -reporting 13 cc of thick blood-tinged fluid  Consultants:  -Neurosurgery -Infectious disease -Interventional radiology -Pharmacy  Procedures: TEE Antimicrobials:  8/2/2021cefazolin 2 g every 12 hours   Subjective: Back pain when he moves, otherwise no other complaints.  Objective: Vitals:   02/06/20 0924 02/06/20 1407 02/06/20 2040 02/07/20 0528  BP: (!) 149/57 (!) 157/57 (!) 148/56 (!) 138/55  Pulse: 82 84 95 78  Resp:   17 17  Temp:  98.4 F (36.9 C) 98.5 F (36.9 C) 98.8 F (37.1 C)  TempSrc:  Oral Oral Oral   SpO2:  93% 94% 93%  Weight:      Height:        Intake/Output Summary (Last 24 hours) at 02/07/2020 8127 Last data filed at 02/07/2020 0756 Gross per 24 hour  Intake 2971.32 ml  Output 1300 ml  Net 1671.32 ml   Filed Weights   02/03/20 1426  Weight: 88.5 kg    Examination:  General exam: Appears calm and comfortable  Respiratory system: Clear to auscultation. Respiratory effort normal. Cardiovascular system: S1 & S2 heard, RRR. No JVD, murmurs, rubs, gallops or clicks.  Gastrointestinal system: Abdomen is nondistended, soft and nontender. Normal bowel sounds heard. Central nervous system: Alert and oriented Extremities: No edema Skin: Warm and dry Psychiatry:  Mood & affect appropriate.     Data Reviewed: I have personally reviewed following labs and imaging studies  CBC: Recent Labs  Lab 01/03/2020 2151 02/04/20 0213 02/04/2020 0558 02/06/20 0334 02/07/20 0523  WBC 11.8* 14.6* 10.4 7.3 7.4  NEUTROABS 10.7*  --   --   --   --   HGB 12.0* 11.5* 10.3* 10.1* 10.3*  HCT 39.2 36.4* 33.6* 32.0* 31.8*  MCV 105.7* 104.0* 107.7* 102.9* 103.2*  PLT 149* 156 149* 150 517*   Basic Metabolic Panel: Recent Labs  Lab 01/07/2020 2151 02/04/20 0213 02/29/2020 0558 02/06/20 0429 02/07/20 0523  NA 141 138 136 139 141  K 4.3 5.0 3.8 3.5 3.7  CL 104 104 105 107 105  CO2 24 26 21* 20* 22  GLUCOSE 211* 131* 97 130* 146*  BUN 55* 58* 62* 65* 64*  CREATININE 2.76* 2.51* 2.59* 2.52* 2.10*  CALCIUM  9.4 8.9 8.3* 8.3* 8.5*   GFR: Estimated Creatinine Clearance: 27 mL/min (A) (by C-G formula based on SCr of 2.1 mg/dL (H)). Liver Function Tests: Recent Labs  Lab 01/30/2020 2151 03/01/2020 0558  AST 13* 16  ALT 14 12  ALKPHOS 102 68  BILITOT 0.4 0.9  PROT 6.9 5.3*  ALBUMIN 3.4* 2.0*   No results for input(s): LIPASE, AMYLASE in the last 168 hours. No results for input(s): AMMONIA in the last 168 hours. Coagulation Profile: No results for input(s): INR, PROTIME in the last 168  hours. Cardiac Enzymes: No results for input(s): CKTOTAL, CKMB, CKMBINDEX, TROPONINI in the last 168 hours. BNP (last 3 results) No results for input(s): PROBNP in the last 8760 hours. HbA1C: No results for input(s): HGBA1C in the last 72 hours. CBG: Recent Labs  Lab 02/04/20 1155 02/04/20 1718 02/04/20 2110 02/22/2020 0655 02/10/2020 1241  GLUCAP 165* 121* 109* 101*  101* 103*   Lipid Profile: No results for input(s): CHOL, HDL, LDLCALC, TRIG, CHOLHDL, LDLDIRECT in the last 72 hours. Thyroid Function Tests: No results for input(s): TSH, T4TOTAL, FREET4, T3FREE, THYROIDAB in the last 72 hours. Anemia Panel: No results for input(s): VITAMINB12, FOLATE, FERRITIN, TIBC, IRON, RETICCTPCT in the last 72 hours. Sepsis Labs: No results for input(s): PROCALCITON, LATICACIDVEN in the last 168 hours.  Recent Results (from the past 240 hour(s))  SARS Coronavirus 2 by RT PCR (hospital order, performed in [facility] hospital lab) Nasopharyngeal Nasopharyngeal Swab     Status: None   Collection Time: 02/03/20  2:27 PM   Specimen: Nasopharyngeal Swab  Result Value Ref Range Status   SARS Coronavirus 2 NEGATIVE NEGATIVE Final    Comment: (NOTE) SARS-CoV-2 target nucleic acids are NOT DETECTED.  The SARS-CoV-2 RNA is generally detectable in upper and lower respiratory specimens during the acute phase of infection. The lowest concentration of SARS-CoV-2 viral copies this assay can detect is 250 copies / mL. A negative result does not preclude SARS-CoV-2 infection and should not be used as the sole basis for treatment or other patient management decisions.  A negative result may occur with improper specimen collection / handling, submission of specimen other than nasopharyngeal swab, presence of viral mutation(s) within the areas targeted by this assay, and inadequate number of viral copies (<250 copies / mL). A negative result must be combined with clinical observations, patient history, and  epidemiological information.  Fact Sheet for Patients:   StrictlyIdeas.no  Fact Sheet for Healthcare Providers: BankingDealers.co.za  This test is not yet approved or  cleared by the [place] FDA and has been authorized for detection and/or diagnosis of SARS-CoV-2 by FDA under an Emergency Use Authorization (EUA).  This EUA will remain in effect (meaning this test can be used) for the duration of the COVID-19 declaration under Section 564(b)(1) of the Act, 21 U.S.C. section 360bbb-3(b)(1), unless the authorization is terminated or revoked sooner.  Performed at [facility], [place] [address] [place], [place] [address]   Blood culture (routine x 2)     Status: Abnormal   Collection Time: 02/03/20  4:41 PM   Specimen: BLOOD LEFT HAND  Result Value Ref Range Status   Specimen Description BLOOD LEFT HAND  Final   Special Requests   Final    BOTTLES DRAWN AEROBIC AND ANAEROBIC Blood Culture adequate volume   Culture  Setup Time   Final    GRAM POSITIVE COCCI IN CLUSTERS IN BOTH AEROBIC AND ANAEROBIC BOTTLES Organism ID to follow CRITICAL  RESULT CALLED TO, READ BACK BY AND VERIFIED WITH: A. Rogers Blocker PharmD 8:50 02/04/20 ([email or web address]) Performed at [facility], [place] [address] [place], [place] [address]    Culture STAPHYLOCOCCUS AUREUS (A)  Final   Report Status 02/06/2020 FINAL  Final   Organism ID, Bacteria STAPHYLOCOCCUS AUREUS  Final      Susceptibility   Staphylococcus aureus - MIC*    CIPROFLOXACIN <=0.5 SENSITIVE Sensitive     ERYTHROMYCIN >=8 RESISTANT Resistant     GENTAMICIN <=0.5 SENSITIVE Sensitive     OXACILLIN 1 SENSITIVE Sensitive     TETRACYCLINE <=1 SENSITIVE Sensitive     VANCOMYCIN 1 SENSITIVE Sensitive     TRIMETH/SULFA <=10 SENSITIVE Sensitive     CLINDAMYCIN <=0.25 SENSITIVE Sensitive     RIFAMPIN <=0.5 SENSITIVE Sensitive     Inducible Clindamycin NEGATIVE Sensitive     * STAPHYLOCOCCUS AUREUS  Blood  Culture ID Panel (Reflexed)     Status: Abnormal   Collection Time: 02/03/20  4:41 PM  Result Value Ref Range Status   Enterococcus species NOT DETECTED NOT DETECTED Final   Listeria monocytogenes NOT DETECTED NOT DETECTED Final   Staphylococcus species DETECTED (A) NOT DETECTED Final    Comment: CRITICAL RESULT CALLED TO, READ BACK BY AND VERIFIED WITH: A. Rogers Blocker PharmD 8:50 02/04/20 ([email or web address])    Staphylococcus aureus (BCID) DETECTED (A) NOT DETECTED Final    Comment: Methicillin (oxacillin) susceptible Staphylococcus aureus (MSSA). Preferred therapy is anti staphylococcal beta lactam antibiotic (Cefazolin or Nafcillin), unless clinically contraindicated. CRITICAL RESULT CALLED TO, READ BACK BY AND VERIFIED WITH: A. Rogers Blocker PharmD 8:50 02/04/20 ([email or web address])    Methicillin resistance NOT DETECTED NOT DETECTED Final   Streptococcus species NOT DETECTED NOT DETECTED Final   Streptococcus agalactiae NOT DETECTED NOT DETECTED Final   Streptococcus pneumoniae NOT DETECTED NOT DETECTED Final   Streptococcus pyogenes NOT DETECTED NOT DETECTED Final   Acinetobacter baumannii NOT DETECTED NOT DETECTED Final   Enterobacteriaceae species NOT DETECTED NOT DETECTED Final   Enterobacter cloacae complex NOT DETECTED NOT DETECTED Final   Escherichia coli NOT DETECTED NOT DETECTED Final   Klebsiella oxytoca NOT DETECTED NOT DETECTED Final   Klebsiella pneumoniae NOT DETECTED NOT DETECTED Final   Proteus species NOT DETECTED NOT DETECTED Final   Serratia marcescens NOT DETECTED NOT DETECTED Final   Haemophilus influenzae NOT DETECTED NOT DETECTED Final   Neisseria meningitidis NOT DETECTED NOT DETECTED Final   Pseudomonas aeruginosa NOT DETECTED NOT DETECTED Final   Candida albicans NOT DETECTED NOT DETECTED Final   Candida glabrata NOT DETECTED NOT DETECTED Final   Candida krusei NOT DETECTED NOT DETECTED Final   Candida parapsilosis NOT DETECTED NOT DETECTED Final   Candida tropicalis NOT DETECTED NOT  DETECTED Final    Comment: Performed at [facility], [place] [address], [place], [place] [address]  Blood culture (routine x 2)     Status: Abnormal   Collection Time: 02/03/20  4:45 PM   Specimen: BLOOD RIGHT HAND  Result Value Ref Range Status   Specimen Description BLOOD RIGHT HAND  Final   Special Requests   Final    BOTTLES DRAWN AEROBIC AND ANAEROBIC Blood Culture adequate volume   Culture  Setup Time   Final    GRAM POSITIVE COCCI IN CLUSTERS IN BOTH AEROBIC AND ANAEROBIC BOTTLES CRITICAL VALUE NOTED.  VALUE IS CONSISTENT WITH PREVIOUSLY REPORTED AND CALLED VALUE.    Culture (A)  Final    STAPHYLOCOCCUS AUREUS SUSCEPTIBILITIES PERFORMED ON PREVIOUS CULTURE WITHIN  THE LAST 5 DAYS. Performed at [facility], [place] [address], [place], [place] [address]    Report Status 02/06/2020 FINAL  Final  Culture, blood (routine x 2)     Status: None (Preliminary result)   Collection Time: 02/16/2020  5:58 AM   Specimen: BLOOD  Result Value Ref Range Status   Specimen Description BLOOD RIGHT HAND  Final   Special Requests   Final    BOTTLES DRAWN AEROBIC ONLY Blood Culture adequate volume   Culture   Final    NO GROWTH 1 DAY Performed at [facility], [place] [address] [place], [place] [address]    Report Status PENDING  Incomplete  Culture, blood (routine x 2)     Status: None (Preliminary result)   Collection Time: 02/21/2020  6:04 AM   Specimen: BLOOD  Result Value Ref Range Status   Specimen Description BLOOD LEFT HAND  Final   Special Requests   Final    BOTTLES DRAWN AEROBIC ONLY Blood Culture adequate volume   Culture   Final    NO GROWTH 1 DAY Performed at [facility], [place] [address] [place], [place] [address]    Report Status PENDING  Incomplete  Aerobic/Anaerobic Culture (surgical/deep wound)     Status: None (Preliminary result)   Collection Time: 02/20/2020 11:35 AM   Specimen: Abscess; Fine Needle Aspirate  Result Value Ref Range Status   Specimen  Description ABSCESS  Final   Special Requests L3 L4 DISC  Final   Gram Stain   Final    ABUNDANT WBC PRESENT, PREDOMINANTLY PMN FEW GRAM POSITIVE COCCI    Culture   Final    RARE STAPHYLOCOCCUS AUREUS SUSCEPTIBILITIES TO FOLLOW Performed at [facility], [address], [place], [place] [address]    Report Status PENDING  Incomplete  Acid Fast Smear (AFB)     Status: None   Collection Time: 02/16/2020 11:35 AM  Result Value Ref Range Status   AFB Specimen Processing Concentration  Final   Acid Fast Smear Negative  Final    Comment: (NOTE) Performed At: [facility] [place], [place] [id number] Rush Farmer MD BD:[id number]    Source (AFB) ABSCESS  Final    Comment: L3 L4 DISC Performed at [facility], [place] [address], [place], [place] [address]          Radiology Studies: DG CHEST PORT 1 VIEW  Result Date: 02/06/2020 CLINICAL DATA:  Postop EXAM: PORTABLE CHEST 1 VIEW COMPARISON:  None. FINDINGS: The cardiomediastinal silhouette is enlarged in contour. Likely small bilateral pleural effusions. No pneumothorax. Atherosclerotic calcifications of the aorta. Peribronchial cuffing. Hilar fullness and peripheral interstitial markings. Questionable nodule versus bone island of the RIGHT apex. Visualized abdomen is unremarkable. No acute osseous abnormality noted. IMPRESSION: 1. Findings suggestive of pulmonary edema with small bilateral pleural effusions. 2. Questionable nodule versus bone island of the RIGHT apex. Recommend follow-up PA and lateral radiograph. Electronically Signed   By: Valentino Saxon MD   On: 02/07/2020 09:39   ECHO TEE  Result Date: 02/16/2020    TRANSESOPHOGEAL ECHO REPORT   Patient Name:   JOSHUA SOULIER Date of Exam: 02/04/2020 Medical Rec #:  [id number]         Height:       73.0 in Accession #:    [id number]        Weight:       195.0 lb Date of Birth:  August 13, 1927          BSA:          2.129 m Patient Age:    86 years           BP:           104/32 mmHg Patient Gender: M                 HR:           68 bpm. Exam Location:  Inpatient Procedure: TEE-Intraopertive, Color Doppler and Cardiac Doppler Indications:     Bacteremia 790.7 / R78.81  History:         Patient has prior history of Echocardiogram examinations, most                  recent 02/04/2020. Risk Factors:Hypertension and Diabetes.                  Anemia.  Sonographer:     Darlina Sicilian RDCS Referring Phys:  [id number] HAO MENG Diagnosing Phys: Skeet Latch MD  Sonographer Comments: Transgastic imaging not obtained PROCEDURE: After discussion of the risks and benefits of a TEE, an informed consent was obtained from the patient. The transesophogeal probe was passed without difficulty through the esophogus of the patient. Local oropharyngeal anesthetic was provided with viscous lidocaine. Sedation performed by different physician. The patient was monitored while under deep sedation. Anesthestetic sedation was provided intravenously by Anesthesiology: 160.68m of Propofol. The patient's vital signs; including heart rate, blood pressure, and oxygen saturation; remained stable throughout the procedure. The patient developed no complications during the procedure. IMPRESSIONS  1. Left ventricular ejection fraction, by estimation, is 55 to 60%. The left ventricle has normal function. The left ventricle has no regional wall motion abnormalities.  2. Right ventricular systolic function is normal. The right ventricular size is normal.  3. Left atrial size was mildly dilated. No left atrial/left atrial appendage thrombus was detected.  4. There is a tiny (0.5 x 0.3 cm) fixed, hyperechoic density on the posterior mitral valve leaflet, consistent with MAC. There is no evidence of endocarditis. The mitral valve is normal in structure. Mild mitral valve regurgitation. No evidence of mitral stenosis.  5. Aortic valve leaflet motion is restricted. No gradients were obtained because we wer  unable to get transgastric images.. The aortic valve is normal in structure. Aortic valve regurgitation is not visualized. No aortic stenosis is present.  6. Descending aorta not well visualized.  7. The inferior vena cava is normal in size with greater than 50% respiratory variability, suggesting right atrial pressure of 3 mmHg. Conclusion(s)/Recommendation(s): No evidence of vegetation/infective endocarditis on this transesophageal echocardiogram. FINDINGS  Left Ventricle: Left ventricular ejection fraction, by estimation, is 55 to 60%. The left ventricle has normal function. The left ventricle has no regional wall motion abnormalities. The left ventricular internal cavity size was normal in size. There is  no left ventricular hypertrophy. Right Ventricle: The right ventricular size is normal. No increase in right ventricular wall thickness. Right ventricular systolic function is normal. Left Atrium: Left atrial size was mildly dilated. No left atrial/left atrial appendage thrombus was detected. Right Atrium: Right atrial size was normal in size. Pericardium: There is no evidence of pericardial effusion. Mitral Valve: There is a tiny (0.5 x 0.3 cm) fixed, hyperechoic density on the posterior mitral valve leaflet, consistent with MAC. There is no evidence of endocarditis. The mitral valve is normal in structure. There is mild thickening of the  mitral valve leaflet(s). Normal mobility of the mitral valve leaflets. Mild mitral annular calcification. Mild mitral valve regurgitation. No evidence of mitral valve stenosis. Tricuspid Valve: The tricuspid valve is normal in structure. Tricuspid valve regurgitation is not demonstrated. No evidence of tricuspid stenosis. Aortic Valve: Aortic valve leaflet motion is restricted. No gradients were obtained because we wer unable to get transgastric images. The aortic valve is normal in structure.. There is moderate thickening and moderate calcification of the aortic valve.  Aortic valve regurgitation is not visualized. No aortic stenosis is present. There is moderate thickening of the aortic valve. There is moderate calcification of the aortic valve. Pulmonic Valve: The pulmonic valve was normal in structure. Pulmonic valve regurgitation is not visualized. No evidence of pulmonic stenosis. Aorta: Descending aorta not well visualized. The aortic root is normal in size and structure. Venous: The inferior vena cava is normal in size with greater than 50% respiratory variability, suggesting right atrial pressure of 3 mmHg. IAS/Shunts: No atrial level shunt detected by color flow Doppler. Skeet Latch MD Electronically signed by Skeet Latch MD Signature Date/Time: 03/03/2020/11:47:32 AM    Final (Updated)    IR LUMBAR [place] W/IMG GUIDE  Result Date: 02/16/2020 INDICATION: 86 year old male presenting with sudden onset of back pain and lower extremity weakness for 3 days. MRI of the lumbar spine showed L2-L3 and L3-L4 discitis/osteomyelitis. He comes to our service for a fluoroscopy guided disc aspiration. EXAM: Fluoroscopy guided L3-4 disc aspiration MEDICATIONS: The patient is currently admitted to the hospital and receiving intravenous antibiotics. The antibiotics were administered within an appropriate time frame prior to the initiation of the procedure. ANESTHESIA/SEDATION: Fentanyl 50 mcg IV; Versed 0.5 mg IV Moderate Sedation Time:  15 The patient was continuously monitored during the procedure by the interventional radiology nurse under my direct supervision. COMPLICATIONS: None immediate. PROCEDURE: Informed written consent was obtained from the patient after a thorough discussion of the procedural risks, benefits and alternatives. All questions were addressed. Maximal Sterile Barrier Technique was utilized including caps, mask, sterile gowns, sterile gloves, sterile drape, hand hygiene and skin antiseptic. A timeout was performed prior to the initiation of the  procedure. The a hand was made to lie prone in the angiography table. The lower back was prepped and draped in the usual sterile fashion. The L3-4 disc space was identified under fluoroscopy in the frontal and lateral planes. The skin was infiltrated with lidocaine 1% approximately 4.5 cm to the left of midline. Subsequently, an 18 gauge x 15 cm needle was advanced under fluoroscopic guidance into the L3-4 disc space. Then, a 22 gauge x 20 cm spinal needle was advanced through the 18 gauge needle into the disc space. Attempted aspiration through the 22 gauge needle proved unsuccessful. The needle was removed and the stylet of the 18 gauge needle was introduced. The 18 gauge needle was then advanced into the center of the disc space. The stylet was removed and 13 cc of thick blood tinged fluid was aspirated. The needle was subsequently withdrawn. The skin was seen and dressed with a sterile bandage. The patient tolerated the procedure well without incident or complication. IMPRESSION: Successful fluoroscopy guided L3-4 disc aspiration. Electronically Signed   By: Pedro Earls M.D.   On: 02/25/2020 11:56        Scheduled Meds: . diltiazem  240 mg Oral Daily  . furosemide  40 mg Oral Daily  . lactose free nutrition  237 mL Oral Daily  . pravastatin  40  mg Oral Daily   Continuous Infusions: . sodium chloride 100 mL/hr at 02/07/20 0736  .  ceFAZolin (ANCEF) IV Stopped (02/06/20 2116)  . heparin 1,700 Units/hr (02/07/20 0736)    Assessment & Plan:   Principal Problem:   Epidural abscess Active Problems:   Diabetes ([place])   Anemia of chronic disease   Hypertension   Pressure injury of skin   Acute deep vein thrombosis (DVT) of distal vein of left lower extremity (HCC)   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HCC)  Possible epidural abscess fluid collection/ Discitis / Osteomyelitis L3-4  -MRI of spine was reviewed 9 cm epidural fluid was identified extending into bilateral  psoas -02/19/2020 status post CT-guided aspiration of  L3-4 fluid drainage -reporting 13 cc of thick blood-tinged fluid Post central line/PICC placement pending clearance of cultures Neurosurgery following -Neurosurgery does not recommend neurosurgical indication at this time.  If he fails antibiotic treatment over time then discussion can be made paraspinal fluid cultures and cytology >> pending ID rec. Continuing Cefazolin    Bacteremia-MSSA - 3/4 blood cultures are growing staph ID following, recommend continuing cefazolin Recheck blood culture Hold PICC placement Monitor for clearance of bacteremia  -02/26/2020 echocardiogram/TEE completed, reporting no obvious vegetation, no signs of endocarditis  Acute DVT On heparin drip, once stable can transition to oral anticoagulation   CKDIIIb Patient has known chronic kidney disease with GFR of 20, creatinine is at baseline.  Slight -Creatinine 2.76 >>  2.51 >> 2.59>>>2.52>>>2.10 Creatinine improving and stabilizing Continue to monitor   HTN Overall stable, continue diltiazem  Continue with Lasix    DM 2 Hold Januvia Monitor blood glucose levels as he may be at risk for hypoglycemia Continue RISS low coverage given CKD    Stage II pressure ulcer (POA)/right first toe, ball of the toe-nondraining Continue wound care Continue to monitor        DVT prophylaxis: Heparin drip Code Status: DNR Family Communication: None at bedside Disposition Plan: Home Status is: Inpatient  Remains inpatient appropriate because:IV treatments appropriate due to intensity of illness or inability to take PO   Dispo: The patient is from: Home              Anticipated d/c is to: Home              Anticipated d/c date is: > 3 days              Patient currently is not medically stable to d/c.            LOS: 4 days   Time spent: 45 min with >50% on coc    Nolberto Hanlon, MD [facility] Pager [phone]  [phone]  If 7PM-7AM, please contact night-coverage [email or web address] Password [email or web address] 02/07/2020, 8:22 AM

## 2020-02-07 NOTE — TOC Progression Note (Signed)
Transition of Care [facility]) - Progression Note    Patient Details  Name: [name] MRN: [id number] Date of Birth: February 01, 1928  Transition of Care [facility]) CM/SW [place], [place] Phone Number: 02/07/2020, 3:11 PM  Clinical Narrative:    CSW notified by bedside RN Leatrice Jewels that pt son in room. CSW met with pt son Merry Proud and pt again. We reviewed discussion had this morning with pt. They are both in agreement with pt placement. They understand I will bring by offers for them to review tomorrow. Pt has been vaccinated, he will need a new COVID swab prior to d/c.   Expected Discharge Plan: [place] Barriers to Discharge: Continued Medical Work up  Expected Discharge Plan and Services Expected Discharge Plan: [place] In-house Referral: Clinical Social Work Discharge Planning Services: CM Consult Post Acute Care Choice: [place] arrangements for the past 2 months: Single Family Home   Readmission Risk Interventions Readmission Risk Prevention Plan 02/07/2020  Transportation Screening Complete  PCP or Specialist Appt within 5-7 Days Not Complete  Not Complete comments plan for SNF  Home Care Screening Complete  Medication Review (RN CM) Referral to Pharmacy  Some recent data might be hidden

## 2020-02-08 DIAGNOSIS — D638 Anemia in other chronic diseases classified elsewhere: Secondary | ICD-10-CM | POA: Diagnosis not present

## 2020-02-08 DIAGNOSIS — G062 Extradural and subdural abscess, unspecified: Secondary | ICD-10-CM | POA: Diagnosis not present

## 2020-02-08 DIAGNOSIS — B9561 Methicillin susceptible Staphylococcus aureus infection as the cause of diseases classified elsewhere: Secondary | ICD-10-CM | POA: Diagnosis not present

## 2020-02-08 DIAGNOSIS — R7881 Bacteremia: Secondary | ICD-10-CM | POA: Diagnosis not present

## 2020-02-08 DIAGNOSIS — M4646 Discitis, unspecified, lumbar region: Secondary | ICD-10-CM

## 2020-02-08 LAB — CBC
HCT: 26 % — ABNORMAL LOW (ref 39.0–52.0)
Hemoglobin: 8.3 g/dL — ABNORMAL LOW (ref 13.0–17.0)
MCH: 33.1 pg (ref 26.0–34.0)
MCHC: 31.9 g/dL (ref 30.0–36.0)
MCV: 103.6 fL — ABNORMAL HIGH (ref 80.0–100.0)
Platelets: 173 10*3/uL (ref 150–400)
RBC: 2.51 MIL/uL — ABNORMAL LOW (ref 4.22–5.81)
RDW: 15.2 % (ref 11.5–15.5)
WBC: 8.3 10*3/uL (ref 4.0–10.5)
nRBC: 0 % (ref 0.0–0.2)

## 2020-02-08 LAB — BASIC METABOLIC PANEL
Anion gap: 13 (ref 5–15)
BUN: 73 mg/dL — ABNORMAL HIGH (ref 8–23)
CO2: 21 mmol/L — ABNORMAL LOW (ref 22–32)
Calcium: 8.5 mg/dL — ABNORMAL LOW (ref 8.9–10.3)
Chloride: 108 mmol/L (ref 98–111)
Creatinine, Ser: 2.14 mg/dL — ABNORMAL HIGH (ref 0.61–1.24)
GFR calc Af Amer: 31 mL/min — ABNORMAL LOW (ref >60)
GFR calc non Af Amer: 26 mL/min — ABNORMAL LOW (ref >60)
Glucose, Bld: 183 mg/dL — ABNORMAL HIGH (ref 70–99)
Potassium: 3.4 mmol/L — ABNORMAL LOW (ref 3.5–5.1)
Sodium: 142 mmol/L (ref 135–145)

## 2020-02-08 LAB — HEPARIN LEVEL (UNFRACTIONATED): Heparin Unfractionated: 0.33 IU/mL (ref 0.30–0.70)

## 2020-02-08 MED ORDER — POTASSIUM CHLORIDE CRYS ER 20 MEQ PO TBCR
40.0000 meq | EXTENDED_RELEASE_TABLET | Freq: Once | ORAL | Status: AC
  Administered 2020-02-08: 40 meq via ORAL
  Filled 2020-02-08: qty 2

## 2020-02-08 MED ORDER — IPRATROPIUM-ALBUTEROL 0.5-2.5 (3) MG/3ML IN SOLN
3.0000 mL | RESPIRATORY_TRACT | Status: DC | PRN
  Administered 2020-02-08 – 2020-02-09 (×3): 3 mL via RESPIRATORY_TRACT
  Filled 2020-02-08 (×3): qty 3

## 2020-02-08 NOTE — Progress Notes (Signed)
[facility] for Infectious Disease  Date of Admission:  01/09/2020     Total days of antibiotics 5         ASSESSMENT:  [name] blood cultures have remained without growth to date from 8/3 and 8/5. If cultures remain negative for the next 24 hours ok to place PICC line. Discussed the plan of care for 6 weeks of IV cefazolin until 9/14. OPAT orders placed. Likely disposition to SNF. Will arrange follow up in ID office.   PLAN:  1. Continue Cefazolin.  2. PICC line in the next 24 hours if cultures remain clear.  3. OPAT orders. 4. Arrange follow up in ID office.   ID will monitor cultures and sign off. Please reconsult if needed.   Diagnosis: MSSA bacteremia and epidural abscess  Culture Result: MSSA  No Known Allergies  OPAT Orders Discharge antibiotics to be given via PICC line Discharge antibiotics: Cefazolin  Per pharmacy protocol  Aim for Vancomycin trough 15-20 or AUC 400-550 (unless otherwise indicated)  Duration: 6 weeks   End Date: 03/18/20  [facility] Care Per Protocol:  Home health RN for IV administration and teaching; PICC line care and labs.    Labs weekly while on IV antibiotics: _X_ CBC with differential _X_ BMP __ CMP _X_ CRP _X_ ESR __ Vancomycin trough __ CK  _X_ Please pull PIC at completion of IV antibiotics __ Please leave PIC in place until doctor has seen patient or been notified  Fax weekly labs to [phone]  Clinic Follow Up Appt:  9/7 at 11:30 am with Terri Piedra, NP  Principal Problem:   Epidural abscess Active Problems:   Diabetes ([place])   Anemia of chronic disease   Hypertension   Pressure injury of skin   Acute deep vein thrombosis (DVT) of distal vein of left lower extremity ([place])   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HCC)   Back pain   Discitis of lumbar region   . benzocaine   Mouth/Throat TID AC  . diltiazem  240 mg Oral Daily  . furosemide  40 mg Oral Daily  . lactose free nutrition  237 mL Oral Daily   . pravastatin  40 mg Oral Daily    SUBJECTIVE:  Afebrile overnight with no acute events. Feeling better today and more talkative. Family present during visit.   No Known Allergies   Review of Systems: Review of Systems  Constitutional: Negative for chills, fever and weight loss.  Respiratory: Negative for cough, shortness of breath and wheezing.   Cardiovascular: Negative for chest pain and leg swelling.  Gastrointestinal: Negative for abdominal pain, constipation, diarrhea, nausea and vomiting.  Skin: Negative for rash.      OBJECTIVE: Vitals:   02/07/20 1651 02/07/20 1737 02/07/20 2018 02/08/20 0455  BP: (!) 140/56 (!) 124/49 (!) 113/52 (!) 136/44  Pulse: 82 80 86 74  Resp: _0 Temp: 97.8 F (36.6 C) 97.7 F (36.5 C) 98.2 F (36.8 C) 97.6 F (36.4 C)  TempSrc: Oral  Oral Oral  SpO2: 93% 96% 95% 95%  Weight:      Height:       Body mass index is 25.73 kg/m.  Physical Exam Constitutional:      General: He is not in acute distress.    Appearance: He is well-developed. He is not ill-appearing.     Comments: Lying in bed with head of bed elevated; pleasant.   Cardiovascular:     Rate and Rhythm: Normal  rate and regular rhythm.     Heart sounds: Normal heart sounds.  Pulmonary:     Effort: Pulmonary effort is normal.     Breath sounds: Normal breath sounds.  Skin:    General: Skin is warm and dry.  Neurological:     Mental Status: He is alert and oriented to person, place, and time.  Psychiatric:        Behavior: Behavior normal.        Thought Content: Thought content normal.        Judgment: Judgment normal.     Lab Results Lab Results  Component Value Date   WBC 8.3 02/08/2020   HGB 8.3 (L) 02/08/2020   HCT 26.0 (L) 02/08/2020   MCV 103.6 (H) 02/08/2020   PLT 173 02/08/2020    Lab Results  Component Value Date   CREATININE 2.14 (H) 02/08/2020   BUN 73 (H) 02/08/2020   NA 142 02/08/2020   K 3.4 (L) 02/08/2020   CL 108 02/08/2020    CO2 21 (L) 02/08/2020    Lab Results  Component Value Date   ALT 12 02/20/2020   AST 16 02/25/2020   ALKPHOS 68 02/07/2020   BILITOT 0.9 03/02/2020     Microbiology: Recent Results (from the past 240 hour(s))  SARS Coronavirus 2 by RT PCR (hospital order, performed in  hospital lab) Nasopharyngeal Nasopharyngeal Swab     Status: None   Collection Time: 02/03/20  2:27 PM   Specimen: Nasopharyngeal Swab  Result Value Ref Range Status   SARS Coronavirus 2 NEGATIVE NEGATIVE Final    Comment: (NOTE) SARS-CoV-2 target nucleic acids are NOT DETECTED.  The SARS-CoV-2 RNA is generally detectable in upper and lower respiratory specimens during the acute phase of infection. The lowest concentration of SARS-CoV-2 viral copies this assay can detect is 250 copies / mL. A negative result does not preclude SARS-CoV-2 infection and should not be used as the sole basis for treatment or other patient management decisions.  A negative result may occur with improper specimen collection / handling, submission of specimen other than nasopharyngeal swab, presence of viral mutation(s) within the areas targeted by this assay, and inadequate number of viral copies (<250 copies / mL). A negative result must be combined with clinical observations, patient history, and epidemiological information.  Fact Sheet for Patients:   https://www.fda.gov/media/136312/download  Fact Sheet for Healthcare Providers: https://www.fda.gov/media/136313/download  This test is not yet approved or  cleared by the [place] FDA and has been authorized for detection and/or diagnosis of SARS-CoV-2 by FDA under an Emergency Use Authorization (EUA).  This EUA will remain in effect (meaning this test can be used) for the duration of the COVID-19 declaration under Section 564(b)(1) of the Act, 21 U.S.C. section 360bbb-3(b)(1), unless the authorization is terminated or revoked sooner.  Performed at [facility], [address] [place], [place] [address]   Blood culture (routine x 2)     Status: Abnormal   Collection Time: 02/03/20  4:41 PM   Specimen: BLOOD LEFT HAND  Result Value Ref Range Status   Specimen Description BLOOD LEFT HAND  Final   Special Requests   Final    BOTTLES DRAWN AEROBIC AND ANAEROBIC Blood Culture adequate volume   Culture  Setup Time   Final    GRAM POSITIVE COCCI IN CLUSTERS IN BOTH AEROBIC AND ANAEROBIC BOTTLES Organism ID to follow CRITICAL RESULT CALLED TO, READ BACK BY AND VERIFIED WITH: A. Wolfe PharmD 8:50   02/04/20 ([email or web address]) Performed at [facility], [address] [place], [place] [address]    Culture STAPHYLOCOCCUS AUREUS (A)  Final   Report Status 02/06/2020 FINAL  Final   Organism ID, Bacteria STAPHYLOCOCCUS AUREUS  Final      Susceptibility   Staphylococcus aureus - MIC*    CIPROFLOXACIN <=0.5 SENSITIVE Sensitive     ERYTHROMYCIN >=8 RESISTANT Resistant     GENTAMICIN <=0.5 SENSITIVE Sensitive     OXACILLIN 1 SENSITIVE Sensitive     TETRACYCLINE <=1 SENSITIVE Sensitive     VANCOMYCIN 1 SENSITIVE Sensitive     TRIMETH/SULFA <=10 SENSITIVE Sensitive     CLINDAMYCIN <=0.25 SENSITIVE Sensitive     RIFAMPIN <=0.5 SENSITIVE Sensitive     Inducible Clindamycin NEGATIVE Sensitive     * STAPHYLOCOCCUS AUREUS  Blood Culture ID Panel (Reflexed)     Status: Abnormal   Collection Time: 02/03/20  4:41 PM  Result Value Ref Range Status   Enterococcus species NOT DETECTED NOT DETECTED Final   Listeria monocytogenes NOT DETECTED NOT DETECTED Final   Staphylococcus species DETECTED (A) NOT DETECTED Final    Comment: CRITICAL RESULT CALLED TO, READ BACK BY AND VERIFIED WITH: A. Wolfe PharmD 8:50 02/04/20 ([email or web address])    Staphylococcus aureus (BCID) DETECTED (A) NOT DETECTED Final    Comment: Methicillin (oxacillin) susceptible Staphylococcus aureus (MSSA). Preferred therapy is anti staphylococcal beta lactam antibiotic (Cefazolin or Nafcillin), unless  clinically contraindicated. CRITICAL RESULT CALLED TO, READ BACK BY AND VERIFIED WITH: A. Wolfe PharmD 8:50 02/04/20 ([email or web address])    Methicillin resistance NOT DETECTED NOT DETECTED Final   Streptococcus species NOT DETECTED NOT DETECTED Final   Streptococcus agalactiae NOT DETECTED NOT DETECTED Final   Streptococcus pneumoniae NOT DETECTED NOT DETECTED Final   Streptococcus pyogenes NOT DETECTED NOT DETECTED Final   Acinetobacter baumannii NOT DETECTED NOT DETECTED Final   Enterobacteriaceae species NOT DETECTED NOT DETECTED Final   Enterobacter cloacae complex NOT DETECTED NOT DETECTED Final   Escherichia coli NOT DETECTED NOT DETECTED Final   Klebsiella oxytoca NOT DETECTED NOT DETECTED Final   Klebsiella pneumoniae NOT DETECTED NOT DETECTED Final   Proteus species NOT DETECTED NOT DETECTED Final   Serratia marcescens NOT DETECTED NOT DETECTED Final   Haemophilus influenzae NOT DETECTED NOT DETECTED Final   Neisseria meningitidis NOT DETECTED NOT DETECTED Final   Pseudomonas aeruginosa NOT DETECTED NOT DETECTED Final   Candida albicans NOT DETECTED NOT DETECTED Final   Candida glabrata NOT DETECTED NOT DETECTED Final   Candida krusei NOT DETECTED NOT DETECTED Final   Candida parapsilosis NOT DETECTED NOT DETECTED Final   Candida tropicalis NOT DETECTED NOT DETECTED Final    Comment: Performed at [facility], [address] , [place] [address]  Blood culture (routine x 2)     Status: Abnormal   Collection Time: 02/03/20  4:45 PM   Specimen: BLOOD RIGHT HAND  Result Value Ref Range Status   Specimen Description BLOOD RIGHT HAND  Final   Special Requests   Final    BOTTLES DRAWN AEROBIC AND ANAEROBIC Blood Culture adequate volume   Culture  Setup Time   Final    GRAM POSITIVE COCCI IN CLUSTERS IN BOTH AEROBIC AND ANAEROBIC BOTTLES CRITICAL VALUE NOTED.  VALUE IS CONSISTENT WITH PREVIOUSLY REPORTED AND CALLED VALUE.    Culture (A)  Final    STAPHYLOCOCCUS  AUREUS SUSCEPTIBILITIES PERFORMED ON PREVIOUS CULTURE WITHIN THE LAST 5 DAYS. Performed at [facility], [address] [place], [place] [address]    Report Status 02/06/2020 FINAL  Final  Culture, blood (routine x 2)     Status: None (Preliminary result)   Collection Time: 02/08/2020  5:58 AM   Specimen: BLOOD  Result Value Ref Range Status   Specimen Description BLOOD RIGHT HAND  Final   Special Requests   Final    BOTTLES DRAWN AEROBIC ONLY Blood Culture adequate volume   Culture   Final    NO GROWTH 2 DAYS Performed at [facility], [address] [place], [place] [address]    Report Status PENDING  Incomplete  Culture, blood (routine x 2)     Status: None (Preliminary result)   Collection Time: 02/24/2020  6:04 AM   Specimen: BLOOD  Result Value Ref Range Status   Specimen Description BLOOD LEFT HAND  Final   Special Requests   Final    BOTTLES DRAWN AEROBIC ONLY Blood Culture adequate volume   Culture   Final    NO GROWTH 2 DAYS Performed at [facility], [address] [place], [place] [address]    Report Status PENDING  Incomplete  Aerobic/Anaerobic Culture (surgical/deep wound)     Status: None (Preliminary result)   Collection Time: 02/04/2020 11:35 AM   Specimen: Abscess; Fine Needle Aspirate  Result Value Ref Range Status   Specimen Description ABSCESS  Final   Special Requests L3 L4 DISC  Final   Gram Stain   Final    ABUNDANT WBC PRESENT, PREDOMINANTLY PMN FEW GRAM POSITIVE COCCI Performed at [facility], [address] [place], [place] [address]    Culture   Final    RARE STAPHYLOCOCCUS AUREUS NO ANAEROBES ISOLATED; CULTURE IN PROGRESS FOR 5 DAYS    Report Status PENDING  Incomplete   Organism ID, Bacteria STAPHYLOCOCCUS AUREUS  Final      Susceptibility   Staphylococcus aureus - MIC*    CIPROFLOXACIN <=0.5 SENSITIVE Sensitive     ERYTHROMYCIN >=8 RESISTANT Resistant     GENTAMICIN <=0.5 SENSITIVE Sensitive     OXACILLIN 1  SENSITIVE Sensitive     TETRACYCLINE <=1 SENSITIVE Sensitive     VANCOMYCIN <=0.5 SENSITIVE Sensitive     TRIMETH/SULFA <=10 SENSITIVE Sensitive     CLINDAMYCIN <=0.25 SENSITIVE Sensitive     RIFAMPIN <=0.5 SENSITIVE Sensitive     Inducible Clindamycin NEGATIVE Sensitive     * RARE STAPHYLOCOCCUS AUREUS  Acid Fast Smear (AFB)     Status: None   Collection Time: 02/21/2020 11:35 AM  Result Value Ref Range Status   AFB Specimen Processing Concentration  Final   Acid Fast Smear Negative  Final    Comment: (NOTE) Performed At: [facility] [address] [place], [place] [id number] Nagendra Sanjai MD Ph:[phone]    Source (AFB) ABSCESS  Final    Comment: L3 L4 DISC Performed at [facility], [address] [place],  [address]      Greg Calone, NP [facility]  02/08/2020  11:14 AM  

## 2020-02-08 NOTE — Progress Notes (Signed)
ANTICOAGULATION CONSULT NOTE - Follow Up Consult  Pharmacy Consult for Heparin Indication: acute DVT  No Known Allergies  Patient Measurements: Height: 6\' 1"  (185.4 cm) Weight: 88.5 kg (195 lb) IBW/kg (Calculated) : 79.9 Heparin Dosing Weight: 88.5 kg  Vital Signs: Temp: 97.6 F (36.4 C) (08/06 0455) Temp Source: Oral (08/06 0455) BP: 136/44 (08/06 0455) Pulse Rate: 74 (08/06 0455)  Labs: Recent Labs    02/06/20 0334 02/06/20 0334 02/06/20 0429 02/06/20 1840 02/07/20 0523 02/07/20 0524 02/07/20 1758 02/08/20 0201  HGB 10.1*   < >  --   --  10.3*  --   --  8.3*  HCT 32.0*  --   --   --  31.8*  --   --  26.0*  PLT 150  --   --   --  149*  --   --  173  HEPARINUNFRC 0.29*   < >  --  0.37  --  0.41  --  0.33  CREATININE  --   --  2.52*  --  2.10*  --   --  2.14*  TROPONINIHS  --   --   --   --   --   --  33*  --    < > = values in this interval not displayed.    Estimated Creatinine Clearance: 26.4 mL/min (A) (by C-G formula based on SCr of 2.14 mg/dL (H)).   Medical History: Past Medical History:  Diagnosis Date  . Anemia   . Callus   . Diabetes mellitus without complication ([place])   . Gastrointestinal ulcer   . Hypertension    Assessment: 86 y.o. male presenting with back pain and found to have discitis/osteomyelitis, MSSA bacteremia, and new LLE DVT on 02/03/20. No PTA anticoagulation. Pharmacy has been consulted for IV heparin dosing.   Today, heparin level remais therapeutic at 0.33 at lower end of goal. Hgb trending down from previous to 8.3, platelets stable. No overt bleeding noted and no issues with the infusion overnight per RN.  Goal of Therapy:  Heparin level 0.3-0.7 units/ml Monitor platelets by anticoagulation protocol: Yes  Plan:  Continue heparin infusion at 1700 units/hr Monitor CBC, daily heparin level  Continue to monitor for signs/symptoms of bleeding F/u transition to oral anticoagulant when no additional procedures planned    Brendolyn Patty, PharmD Clinical Pharmacist  02/08/2020   9:01 AM   Please check AMION for all [place] phone numbers After 10:00 PM, call the [place] [phone]

## 2020-02-08 NOTE — Progress Notes (Signed)
Occupational Therapy Treatment Patient Details Name: [name] MRN: [id number] DOB: 20-Jun-1928 Today's Date: 02/08/2020    History of present illness Pt is a pleasant 86 y.o. 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  Pt is s/p L3/4 disc aspiration. Pt has been on Heparin since 02/03/2020 for DVT. TEE conducted and found to have mitral calcification. Pt has PMH of HTN, CKD, HLD and T2DM.    OT comments  Patient supine in bed when therapist entered room and agreeable to therapy. Patient able to assist with rolling and sidelying to sit using bed rails and therapist providing mod assist and instructing patient on log roll technique and spinal precautions. Once at edge of bed patient exhibited posterior pelvic tilt, poor posture and poor activity tolerance and requiring min assist to maintain sitting balance. Patient reported dizziness and "feeling sick." Patient returned to supine with max assist and significant cueing for spinal precautions. Patient reports "it's too early for therapy" and therapist educated patient on therapeutic process and the need to maintain strength and mobility. Patient will need reiteration of all education. Continue to recommend short term rehab at discharge. PT and rehab tech in room at end of session and left patient in their care.   Follow Up Recommendations  SNF    Equipment Recommendations  Other (comment)    Recommendations for Other Services      Precautions / Restrictions Precautions Precautions: Back;Fall Precaution Booklet Issued: No Precaution Comments: Spinal precautions and log roll technique reiterated. Restrictions Weight Bearing Restrictions: No       Mobility Bed Mobility Overal bed mobility: Needs Assistance Bed Mobility: Rolling;Sidelying to Sit;Sit to Supine Rolling: Mod assist Sidelying to sit: Mod assist   Sit to supine: Max assist   General bed  mobility comments: Patient able to assist with bed mobility using  bed rails.  Transfers Overall transfer level:  (deferred today due to symptomatic orthostatic hypotension)               General transfer comment: Patient reports diziness and then mild complaints of "feeling sick" at edge of bed. Patient reports feeling too weak to stand. Patient returned to supine.    Balance Overall balance assessment: Needs assistance Sitting-balance support: No upper extremity supported;Feet supported Sitting balance-Leahy Scale: Poor Sitting balance - Comments: MIn assist to maintain seated position predominantly. Once patinet able to maitnain position with elbows on his knees but therapist corrected his posture due to spinal precautions. Postural control: Posterior lean Standing balance support:  (n/a)                               ADL either performed or assessed with clinical judgement   ADL                                               Vision Baseline Vision/History: Wears glasses Wears Glasses: At all times     Perception     Praxis      Cognition Arousal/Alertness: Awake/alert Behavior During Therapy: [facility] for tasks assessed/performed Overall Cognitive Status: Within Functional Limits for tasks assessed  General Comments: cooperative and pleasant        Exercises General Exercises - Lower Extremity Heel Slides: Strengthening;Both;10 reps;Supine (against manual resistance for flexion and extension) Hip ABduction/ADduction: AAROM;Both;10 reps;Supine   Shoulder Instructions       General Comments BP: 128/43 in supine; 70/34 sitting EOB. pt reports dizziness    Pertinent Vitals/ Pain       Pain Assessment: Faces Faces Pain Scale: Hurts a little bit Pain Location: low back Pain Descriptors / Indicators: Constant;Burning;Grimacing Pain Intervention(s): Limited activity within patient's  tolerance;Monitored during session;Repositioned  Home Living                                          Prior Functioning/Environment              Frequency  Min 2X/week        Progress Toward Goals  OT Goals(current goals can now be found in the care plan section)  Progress towards OT goals: OT to reassess next treatment  Acute Rehab OT Goals Patient Stated Goal: to return home OT Goal Formulation: With patient Time For Goal Achievement: 02/20/20 Potential to Achieve Goals: [place] Discharge plan remains appropriate    Co-evaluation                 AM-PAC OT "6 Clicks" Daily Activity     Outcome Measure   Help from another person eating meals?: None Help from another person taking care of personal grooming?: None Help from another person toileting, which includes using toliet, bedpan, or urinal?: Total Help from another person bathing (including washing, rinsing, drying)?: A Lot Help from another person to put on and taking off regular upper body clothing?: A Lot Help from another person to put on and taking off regular lower body clothing?: Total 6 Click Score: 14    End of Session Equipment Utilized During Treatment: Gait belt  OT Visit Diagnosis: Unsteadiness on feet (R26.81);Muscle weakness (generalized) (M62.81);Pain   Activity Tolerance Patient limited by fatigue;Patient limited by pain   Patient Left in bed;with call bell/phone within reach;with bed alarm set;with nursing/sitter in room   Nurse Communication  (okay to see per RN)        Time: 2202-5427 OT Time Calculation (min): 26 min  Charges: OT General Charges $OT Visit: 1 Visit OT Treatments $Therapeutic Activity: 23-37 mins  Deshanda Molitor, OTR/L [place]  Office [phone] Pager: [place] 02/08/2020, 4:19 PM

## 2020-02-08 NOTE — TOC Progression Note (Signed)
Transition of Care [facility]) - Progression Note    Patient Details  Name: [name] MRN: [id number] Date of Birth: 12-15-1927  Transition of Care [facility]) CM/SW [place], [place] Phone Number: 02/08/2020, 2:33 PM  Clinical Narrative:    CSW met with pt at pt son at bedside. Provided SNF offers, pt expresses feeling much better today. Pt son Merry Proud expressed interest in [facility]. After f/u with the SNF they can offer but wont have a bed until next week therefore I encouraged pt son to have back up offer ready. Pt will need a new COVID swab 24-48 hrs prior to d/c. He has been double vaccinated for COVID.   Expected Discharge Plan: [place] Barriers to Discharge: Continued Medical Work up  Expected Discharge Plan and Services Expected Discharge Plan: [place] In-house Referral: Clinical Social Work Discharge Planning Services: CM Consult Post Acute Care Choice: [place] arrangements for the past 2 months: Single Family Home  Readmission Risk Interventions Readmission Risk Prevention Plan 02/07/2020  Transportation Screening Complete  PCP or Specialist Appt within 5-7 Days Not Complete  Not Complete comments plan for SNF  Home Care Screening Complete  Medication Review (RN CM) Referral to Pharmacy  Some recent data might be hidden

## 2020-02-08 NOTE — Progress Notes (Signed)
Physical Therapy Treatment Patient Details Name: [name] MRN: [id number] DOB: [birth year] Today's Date: 02/08/2020    History of Present Illness Pt is a pleasant 86 y.o. 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  Pt is s/p L3/4 disc aspiration. Pt has been on Heparin since 02/03/2020 for DVT. TEE conducted and found to have mitral calcification. Pt has PMH of HTN, CKD, HLD and T2DM.     PT Comments    Pt in bed upon arrival of PT, agreeable to session with focus on progressing OOB transfers. The pt was able to transition to sitting EOB, but required modA of 2 to complete transfer, and then required modA to maintain seated position. The pt was symptomatic for orthostatic hypotension  (BP from 128/43 in supine to 70/34 sitting EOB) and was therefore returned to supine position with modA of 2. The pt tolerates the chair position in the bed well, and will continue to benefit from skilled PT to further progress activity tolerance and progression to further upright mobility.    Follow Up Recommendations  SNF;Supervision/Assistance - 24 hour     Equipment Recommendations  None recommended by PT    Recommendations for Other Services       Precautions / Restrictions Precautions Precautions: Back Precaution Booklet Issued: No Precaution Comments: pt was educated on spine precautions for pain management; orthostatic Restrictions Weight Bearing Restrictions: No    Mobility  Bed Mobility Overal bed mobility: Needs Assistance Bed Mobility: Rolling;Sidelying to Sit;Sit to Supine Rolling: Mod assist;+2 for safety/equipment Sidelying to sit: Mod assist;+2 for safety/equipment;HOB elevated;+2 for physical assistance   Sit to supine: Mod assist;+2 for safety/equipment;+2 for physical assistance   General bed mobility comments: pt required +2 mod assistance for safety with rolling at the trunk and hips along  with bed railings.  Pt required +2 mod assist for sidelying to sit at the trunk and BLE.   Transfers Overall transfer level:  (deferred today due to symptomatic orthostatic hypotension)                      Balance Overall balance assessment: Needs assistance Sitting-balance support: No upper extremity supported;Feet supported Sitting balance-Leahy Scale: Poor Sitting balance - Comments: pt required min-modA to maintain sitting EOB Postural control: Posterior lean Standing balance support:  (n/a)                                Cognition Arousal/Alertness: Awake/alert Behavior During Therapy: Flat affect Overall Cognitive Status: Within Functional Limits for tasks assessed                                 General Comments: cooperative and pleasant      Exercises General Exercises - Lower Extremity Heel Slides: Strengthening;Both;10 reps;Supine (against manual resistance for flexion and extension) Hip ABduction/ADduction: AAROM;Both;10 reps;Supine    General Comments General comments (skin integrity, edema, etc.): BP: 128/43 in supine; 70/34 sitting EOB. pt reports dizziness      Pertinent Vitals/Pain Pain Assessment: Faces Faces Pain Scale: Hurts a little bit Pain Location: low back Pain Descriptors / Indicators: Constant;Burning;Grimacing Pain Intervention(s): Limited activity within patient's tolerance;Monitored during session;Repositioned           PT Goals (current goals can now be found in the care plan section)  Acute Rehab PT Goals Patient Stated Goal: to return home PT Goal Formulation: With patient Time For Goal Achievement: 02/20/20 Potential to Achieve Goals: Fair Progress towards PT goals: Progressing toward goals    Frequency    Min 2X/week      PT Plan Current plan remains appropriate       AM-PAC PT "6 Clicks" Mobility   Outcome Measure  Help needed turning from your back to your side while in a flat bed  without using bedrails?: A Lot Help needed moving from lying on your back to sitting on the side of a flat bed without using bedrails?: A Lot Help needed moving to and from a bed to a chair (including a wheelchair)?: A Lot Help needed standing up from a chair using your arms (e.g., wheelchair or bedside chair)?: A Lot Help needed to walk in hospital room?: Total Help needed climbing 3-5 steps with a railing? : Total 6 Click Score: 10    End of Session Equipment Utilized During Treatment: Oxygen (2 L) Activity Tolerance: Other (comment) (dizziness (orthostatic)) Patient left: in bed;with call bell/phone within reach;with bed alarm set (in chair position) Nurse Communication: Mobility status (orthostatics) PT Visit Diagnosis: Pain;Muscle weakness (generalized) (M62.81) Pain - part of body: Leg (BLE and back)     Time: 1324-4010 PT Time Calculation (min) (ACUTE ONLY): 29 min  Charges:  $Therapeutic Exercise: 8-22 mins $Therapeutic Activity: 8-22 mins                     Karma Ganja, PT, DPT   Acute Rehabilitation Department Pager #: [phone]   Otho Bellows 02/08/2020, 3:30 PM

## 2020-02-08 NOTE — Progress Notes (Signed)
PROGRESS NOTE    [name]  FVC:[id number] DOB: Nov 30, 1927 DOA: 01/24/2020 PCP: Haywood Pao, MD    Brief Narrative:  [name] an 86 y.o.malewith HTN, DM 2 who was in his usual state of health until 2:00 PM 01/29/2020 when he noted sudden onset of back pain associated with lower extremity weakness.   Patient reports of generalized weakness, probably due to his low iron, he gets IV iron with his nephrologist twice a month.  Patient subsequently had acute lower extremity weakness, and back pain unable to ambulate  ED evaluation: He underwent Doppler study which did show anacute DVT. Patient underwent an MRI L-spine which found osteomyelitis/discitis at the L2-3 level with an epidural abscess measuring 9.2 cm extending down into the bilateral psoas muscles.   02/04/2020 -patient hemodynamically stable exception of a T-max of 101.4, 3 out of 4 blood culture positive for staph.   -Patient was not on any antibiotics, no notes from consultants No consultation to IR or IV was placed. Neurosurgery/infectious disease, IR, pharmacy were all consulted. Case discussed with cardiology regarding TEE Patient status was discussed in detail with patient and his son at bedside.  02/26/2020 -T-max 103.1 overnight.. Otherwise hemodynamically stable Status post TEE this morning-tolerated well -reported no obvious agitation Status post L3-4 fluid drainage -reporting 13 cc of thick blood-tinged fluid  Consultants:  -Neurosurgery -Infectious disease -Interventional radiology -Pharmacy  Procedures: TEE Antimicrobials:  8/2/2021cefazolin 2 g every 12 hours   Subjective: No complaints. Today is doing better. Son at bedside and reports pt more interactive and talkative today.  Yesterday patient complaining of shortness of breath, chest x-ray revealed pleural effusion and possible pneumonia versus pulmonary edema.  Today he reports feeling better and less short of  breath  Objective: Vitals:   02/07/20 1651 02/07/20 1737 02/07/20 2018 02/08/20 0455  BP: (!) 140/56 (!) 124/49 (!) 113/52 (!) 136/44  Pulse: 82 80 86 74  Resp: 17 17 16 14   Temp: 97.8 F (36.6 C) 97.7 F (36.5 C) 98.2 F (36.8 C) 97.6 F (36.4 C)  TempSrc: Oral  Oral Oral  SpO2: 93% 96% 95% 95%  Weight:      Height:        Intake/Output Summary (Last 24 hours) at 02/08/2020 1355 Last data filed at 02/08/2020 6384 Gross per 24 hour  Intake 1243.02 ml  Output 950 ml  Net 293.02 ml   Filed Weights   02/03/20 1426  Weight: 88.5 kg    Examination:  General exam: Appears calm and comfortable nad, talkative Respiratory system: CTA, no wheeze rales rhonchi's Cardiovascular system: RRR S1-S2 no murmurs  Gastrointestinal system: Soft nontender nondistended positive bowel sounds  Central nervous system: Awake alert, grossly intact Extremities: No edema no cyanosis Skin: Warm and dry Psychiatry:  Mood & affect appropriate in current setting.     Data Reviewed: I have personally reviewed following labs and imaging studies  CBC: Recent Labs  Lab 01/18/2020 2151 01/08/2020 2151 02/04/20 0213 02/21/2020 0558 02/06/20 0334 02/07/20 0523 02/08/20 0201  WBC 11.8*   < > 14.6* 10.4 7.3 7.4 8.3  NEUTROABS 10.7*  --   --   --   --   --   --   HGB 12.0*   < > 11.5* 10.3* 10.1* 10.3* 8.3*  HCT 39.2   < > 36.4* 33.6* 32.0* 31.8* 26.0*  MCV 105.7*   < > 104.0* 107.7* 102.9* 103.2* 103.6*  PLT 149*   < > 156 149* 150 149*  173   < > = values in this interval not displayed.   Basic Metabolic Panel: Recent Labs  Lab 02/04/20 0213 02/26/2020 0558 02/06/20 0429 02/07/20 0523 02/08/20 0201  NA 138 136 139 141 142  K 5.0 3.8 3.5 3.7 3.4*  CL 104 105 107 105 108  CO2 26 21* 20* 22 21*  GLUCOSE 131* 97 130* 146* 183*  BUN 58* 62* 65* 64* 73*  CREATININE 2.51* 2.59* 2.52* 2.10* 2.14*  CALCIUM 8.9 8.3* 8.3* 8.5* 8.5*   GFR: Estimated Creatinine Clearance: 26.4 mL/min (A) (by C-G  formula based on SCr of 2.14 mg/dL (H)). Liver Function Tests: Recent Labs  Lab 02/01/2020 2151 03/01/2020 0558  AST 13* 16  ALT 14 12  ALKPHOS 102 68  BILITOT 0.4 0.9  PROT 6.9 5.3*  ALBUMIN 3.4* 2.0*   No results for input(s): LIPASE, AMYLASE in the last 168 hours. No results for input(s): AMMONIA in the last 168 hours. Coagulation Profile: No results for input(s): INR, PROTIME in the last 168 hours. Cardiac Enzymes: No results for input(s): CKTOTAL, CKMB, CKMBINDEX, TROPONINI in the last 168 hours. BNP (last 3 results) No results for input(s): PROBNP in the last 8760 hours. HbA1C: No results for input(s): HGBA1C in the last 72 hours. CBG: Recent Labs  Lab 02/04/20 1155 02/04/20 1718 02/04/20 2110 02/11/2020 0655 02/28/2020 1241  GLUCAP 165* 121* 109* 101*  101* 103*   Lipid Profile: No results for input(s): CHOL, HDL, LDLCALC, TRIG, CHOLHDL, LDLDIRECT in the last 72 hours. Thyroid Function Tests: No results for input(s): TSH, T4TOTAL, FREET4, T3FREE, THYROIDAB in the last 72 hours. Anemia Panel: No results for input(s): VITAMINB12, FOLATE, FERRITIN, TIBC, IRON, RETICCTPCT in the last 72 hours. Sepsis Labs: No results for input(s): PROCALCITON, LATICACIDVEN in the last 168 hours.  Recent Results (from the past 240 hour(s))  SARS Coronavirus 2 by RT PCR (hospital order, performed in [facility] hospital lab) Nasopharyngeal Nasopharyngeal Swab     Status: None   Collection Time: 02/03/20  2:27 PM   Specimen: Nasopharyngeal Swab  Result Value Ref Range Status   SARS Coronavirus 2 NEGATIVE NEGATIVE Final    Comment: (NOTE) SARS-CoV-2 target nucleic acids are NOT DETECTED.  The SARS-CoV-2 RNA is generally detectable in upper and lower respiratory specimens during the acute phase of infection. The lowest concentration of SARS-CoV-2 viral copies this assay can detect is 250 copies / mL. A negative result does not preclude SARS-CoV-2 infection and should not be used as the  sole basis for treatment or other patient management decisions.  A negative result may occur with improper specimen collection / handling, submission of specimen other than nasopharyngeal swab, presence of viral mutation(s) within the areas targeted by this assay, and inadequate number of viral copies (<250 copies / mL). A negative result must be combined with clinical observations, patient history, and epidemiological information.  Fact Sheet for Patients:   StrictlyIdeas.no  Fact Sheet for Healthcare Providers: BankingDealers.co.za  This test is not yet approved or  cleared by the [place] FDA and has been authorized for detection and/or diagnosis of SARS-CoV-2 by FDA under an Emergency Use Authorization (EUA).  This EUA will remain in effect (meaning this test can be used) for the duration of the COVID-19 declaration under Section 564(b)(1) of the Act, 21 U.S.C. section 360bbb-3(b)(1), unless the authorization is terminated or revoked sooner.  Performed at [facility], [place] [address] [place], [place] [address]   Blood culture (routine x 2)  Status: Abnormal   Collection Time: 02/03/20  4:41 PM   Specimen: BLOOD LEFT HAND  Result Value Ref Range Status   Specimen Description BLOOD LEFT HAND  Final   Special Requests   Final    BOTTLES DRAWN AEROBIC AND ANAEROBIC Blood Culture adequate volume   Culture  Setup Time   Final    GRAM POSITIVE COCCI IN CLUSTERS IN BOTH AEROBIC AND ANAEROBIC BOTTLES Organism ID to follow CRITICAL RESULT CALLED TO, READ BACK BY AND VERIFIED WITH: A. Rogers Blocker PharmD 8:50 02/04/20 ([email or web address]) Performed at [facility], [place] [address], [place], [place] [address]    Culture STAPHYLOCOCCUS AUREUS (A)  Final   Report Status 02/06/2020 FINAL  Final   Organism ID, Bacteria STAPHYLOCOCCUS AUREUS  Final      Susceptibility   Staphylococcus aureus - MIC*    CIPROFLOXACIN <=0.5 SENSITIVE Sensitive      ERYTHROMYCIN >=8 RESISTANT Resistant     GENTAMICIN <=0.5 SENSITIVE Sensitive     OXACILLIN 1 SENSITIVE Sensitive     TETRACYCLINE <=1 SENSITIVE Sensitive     VANCOMYCIN 1 SENSITIVE Sensitive     TRIMETH/SULFA <=10 SENSITIVE Sensitive     CLINDAMYCIN <=0.25 SENSITIVE Sensitive     RIFAMPIN <=0.5 SENSITIVE Sensitive     Inducible Clindamycin NEGATIVE Sensitive     * STAPHYLOCOCCUS AUREUS  Blood Culture ID Panel (Reflexed)     Status: Abnormal   Collection Time: 02/03/20  4:41 PM  Result Value Ref Range Status   Enterococcus species NOT DETECTED NOT DETECTED Final   Listeria monocytogenes NOT DETECTED NOT DETECTED Final   Staphylococcus species DETECTED (A) NOT DETECTED Final    Comment: CRITICAL RESULT CALLED TO, READ BACK BY AND VERIFIED WITH: A. Rogers Blocker PharmD 8:50 02/04/20 ([email or web address])    Staphylococcus aureus (BCID) DETECTED (A) NOT DETECTED Final    Comment: Methicillin (oxacillin) susceptible Staphylococcus aureus (MSSA). Preferred therapy is anti staphylococcal beta lactam antibiotic (Cefazolin or Nafcillin), unless clinically contraindicated. CRITICAL RESULT CALLED TO, READ BACK BY AND VERIFIED WITH: A. Rogers Blocker PharmD 8:50 02/04/20 ([email or web address])    Methicillin resistance NOT DETECTED NOT DETECTED Final   Streptococcus species NOT DETECTED NOT DETECTED Final   Streptococcus agalactiae NOT DETECTED NOT DETECTED Final   Streptococcus pneumoniae NOT DETECTED NOT DETECTED Final   Streptococcus pyogenes NOT DETECTED NOT DETECTED Final   Acinetobacter baumannii NOT DETECTED NOT DETECTED Final   Enterobacteriaceae species NOT DETECTED NOT DETECTED Final   Enterobacter cloacae complex NOT DETECTED NOT DETECTED Final   Escherichia coli NOT DETECTED NOT DETECTED Final   Klebsiella oxytoca NOT DETECTED NOT DETECTED Final   Klebsiella pneumoniae NOT DETECTED NOT DETECTED Final   Proteus species NOT DETECTED NOT DETECTED Final   Serratia marcescens NOT DETECTED NOT DETECTED Final   Haemophilus  influenzae NOT DETECTED NOT DETECTED Final   Neisseria meningitidis NOT DETECTED NOT DETECTED Final   Pseudomonas aeruginosa NOT DETECTED NOT DETECTED Final   Candida albicans NOT DETECTED NOT DETECTED Final   Candida glabrata NOT DETECTED NOT DETECTED Final   Candida krusei NOT DETECTED NOT DETECTED Final   Candida parapsilosis NOT DETECTED NOT DETECTED Final   Candida tropicalis NOT DETECTED NOT DETECTED Final    Comment: Performed at [facility], [place] [address], [place], [place] [address]  Blood culture (routine x 2)     Status: Abnormal   Collection Time: 02/03/20  4:45 PM   Specimen: BLOOD RIGHT HAND  Result Value Ref Range Status   Specimen Description  BLOOD RIGHT HAND  Final   Special Requests   Final    BOTTLES DRAWN AEROBIC AND ANAEROBIC Blood Culture adequate volume   Culture  Setup Time   Final    GRAM POSITIVE COCCI IN CLUSTERS IN BOTH AEROBIC AND ANAEROBIC BOTTLES CRITICAL VALUE NOTED.  VALUE IS CONSISTENT WITH PREVIOUSLY REPORTED AND CALLED VALUE.    Culture (A)  Final    STAPHYLOCOCCUS AUREUS SUSCEPTIBILITIES PERFORMED ON PREVIOUS CULTURE WITHIN THE LAST 5 DAYS. Performed at [facility], [place] [address], [place], [place] [address]    Report Status 02/06/2020 FINAL  Final  Culture, blood (routine x 2)     Status: None (Preliminary result)   Collection Time: 03/02/2020  5:58 AM   Specimen: BLOOD  Result Value Ref Range Status   Specimen Description BLOOD RIGHT HAND  Final   Special Requests   Final    BOTTLES DRAWN AEROBIC ONLY Blood Culture adequate volume   Culture   Final    NO GROWTH 2 DAYS Performed at [facility], [place] [address], [place], [place] [address]    Report Status PENDING  Incomplete  Culture, blood (routine x 2)     Status: None (Preliminary result)   Collection Time: 03/03/2020  6:04 AM   Specimen: BLOOD  Result Value Ref Range Status   Specimen Description BLOOD LEFT HAND  Final   Special Requests   Final    BOTTLES DRAWN  AEROBIC ONLY Blood Culture adequate volume   Culture   Final    NO GROWTH 2 DAYS Performed at [facility], [place] [address] [place], [place] [address]    Report Status PENDING  Incomplete  Aerobic/Anaerobic Culture (surgical/deep wound)     Status: None (Preliminary result)   Collection Time: 02/13/2020 11:35 AM   Specimen: Abscess; Fine Needle Aspirate  Result Value Ref Range Status   Specimen Description ABSCESS  Final   Special Requests L3 L4 DISC  Final   Gram Stain   Final    ABUNDANT WBC PRESENT, PREDOMINANTLY PMN FEW GRAM POSITIVE COCCI Performed at [facility], [address] [place], [place] [address]    Culture   Final    RARE STAPHYLOCOCCUS AUREUS NO ANAEROBES ISOLATED; CULTURE IN PROGRESS FOR 5 DAYS    Report Status PENDING  Incomplete   Organism ID, Bacteria STAPHYLOCOCCUS AUREUS  Final      Susceptibility   Staphylococcus aureus - MIC*    CIPROFLOXACIN <=0.5 SENSITIVE Sensitive     ERYTHROMYCIN >=8 RESISTANT Resistant     GENTAMICIN <=0.5 SENSITIVE Sensitive     OXACILLIN 1 SENSITIVE Sensitive     TETRACYCLINE <=1 SENSITIVE Sensitive     VANCOMYCIN <=0.5 SENSITIVE Sensitive     TRIMETH/SULFA <=10 SENSITIVE Sensitive     CLINDAMYCIN <=0.25 SENSITIVE Sensitive     RIFAMPIN <=0.5 SENSITIVE Sensitive     Inducible Clindamycin NEGATIVE Sensitive     * RARE STAPHYLOCOCCUS AUREUS  Acid Fast Smear (AFB)     Status: None   Collection Time: 02/17/2020 11:35 AM  Result Value Ref Range Status   AFB Specimen Processing Concentration  Final   Acid Fast Smear Negative  Final    Comment: (NOTE) Performed At: [facility] [place], [place] [id number] Rush Farmer MD [id number]    Source (AFB) ABSCESS  Final    Comment: L3 L4 DISC Performed at [facility], [place] [address], [place], [place] [address]  Radiology Studies: DG Chest Port 1 View  Result Date: 02/07/2020 CLINICAL DATA:  Shortness of breath. EXAM:  PORTABLE CHEST 1 VIEW COMPARISON:  02/24/2020 FINDINGS: There are worsening coarse bilateral airspace opacities. The patient is rotated. The heart size is unchanged. There are growing bilateral pleural effusions, left greater than right. There is no pneumothorax. No definite acute osseous abnormality. IMPRESSION: 1. Growing bilateral pleural effusions, left greater than right. 2. Worsening airspace opacities bilaterally may represent worsening pulmonary edema or pneumonia. Electronically Signed   By: Constance Holster M.D.   On: 02/07/2020 18:24        Scheduled Meds: . benzocaine   Mouth/Throat TID AC  . diltiazem  240 mg Oral Daily  . furosemide  40 mg Oral Daily  . lactose free nutrition  237 mL Oral Daily  . pravastatin  40 mg Oral Daily   Continuous Infusions: .  ceFAZolin (ANCEF) IV 2 g (02/08/20 1027)  . heparin 1,700 Units/hr (02/08/20 0309)    Assessment & Plan:   Principal Problem:   Epidural abscess Active Problems:   Diabetes ([place])   Anemia of chronic disease   Hypertension   Pressure injury of skin   Acute deep vein thrombosis (DVT) of distal vein of left lower extremity (HCC)   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HCC)   Back pain   Discitis of lumbar region  Possible epidural abscess fluid collection/ Discitis / Osteomyelitis L3-4  -MRI of spine was reviewed 9 cm epidural fluid was identified extending into bilateral psoas -02/13/2020 status post CT-guided aspiration of  L3-4 fluid drainage -reporting 13 cc of thick blood-tinged fluid Post central line/PICC placement pending clearance of cultures Neurosurgery following -Neurosurgery does not recommend neurosurgical indication at this time.  If he fails antibiotic treatment over time then discussion can be made paraspinal fluid cultures and cytology >> pending ID following Continue cefazolin PICC line in the next 24 hours if cultures remain clear Will need follow-up with ID as outpatient     MSSA Bacteremia- -  3/4 blood cultures are growing staph ID following, recommend continuing cefazolin Blood cultures repeated so far clear Within 24 hours if cultures remain clear -02/29/2020 echocardiogram/TEE completed, reporting no obvious vegetation, no signs of endocarditis  Acute DVT Continue on heparin drip with transition to p.o. oral anticoagulation     CKDIIIb Patient has known chronic kidney disease with GFR of 20, creatinine is at baseline.  Slight -Creatinine 2.76 >>  2.51 >> 2.59>>>2.52>>>2.14 now  Creatinine has improved and stabilizing Continue to monitor   HTN Stable, continue diltiazem and Lasix   SOB- cxr with growing bilateral pleural effusion and question pulmonary edema versus pneumonia.  Likely pneumonia. IV fluids to be discontinued Continued Lasix    DM 2 Hold Januvia Monitor blood glucose levels as he may be at risk for hypoglycemia Continue R ISS    Stage II pressure ulcer (POA)/right first toe, ball of the toe-nondraining Continue wound care Continue to monitor        DVT prophylaxis: Heparin drip Code Status: DNR Family Communication: None at bedside Disposition Plan: Home Status is: Inpatient  Remains inpatient appropriate because:IV treatments appropriate due to intensity of illness or inability to take PO   Dispo: The patient is from: Home              Anticipated d/c is to: Home              Anticipated d/c date is: > 3 days  Patient currently is not medically stable to d/c.            LOS: 5 days   Time spent: 35 min with >50% on coc    Nolberto Hanlon, MD [facility] Pager [phone]  If 7PM-7AM, please contact night-coverage [email or web address] Password [email or web address] 02/08/2020, 1:55 PM

## 2020-02-08 NOTE — Progress Notes (Signed)
PHARMACY CONSULT NOTE FOR:  OUTPATIENT  PARENTERAL ANTIBIOTIC THERAPY (OPAT)  Indication: MSSA bacteremia/epidural abscess Regimen: Cefazolin 2 gm every 12 hours End date: 03/18/20  IV antibiotic discharge orders are pended. To discharging provider:  please sign these orders via discharge navigator,  Select New Orders & click on the button choice - Manage This Unsigned Work.     Thank you for allowing pharmacy to be a part of this patient's care.  Jimmy Footman, PharmD, BCPS, [facility] Clinical Pharmacist Phone: [phone] 02/08/2020, 11:20 AM

## 2020-02-09 DIAGNOSIS — G062 Extradural and subdural abscess, unspecified: Secondary | ICD-10-CM | POA: Diagnosis not present

## 2020-02-09 LAB — BASIC METABOLIC PANEL
Anion gap: 17 — ABNORMAL HIGH (ref 5–15)
BUN: 93 mg/dL — ABNORMAL HIGH (ref 8–23)
CO2: 14 mmol/L — ABNORMAL LOW (ref 22–32)
Calcium: 8.3 mg/dL — ABNORMAL LOW (ref 8.9–10.3)
Chloride: 106 mmol/L (ref 98–111)
Creatinine, Ser: 3.25 mg/dL — ABNORMAL HIGH (ref 0.61–1.24)
GFR calc Af Amer: 19 mL/min — ABNORMAL LOW (ref >60)
GFR calc non Af Amer: 16 mL/min — ABNORMAL LOW (ref >60)
Glucose, Bld: 216 mg/dL — ABNORMAL HIGH (ref 70–99)
Potassium: 4.6 mmol/L (ref 3.5–5.1)
Sodium: 137 mmol/L (ref 135–145)

## 2020-02-09 LAB — HEPARIN LEVEL (UNFRACTIONATED): Heparin Unfractionated: 0.8 IU/mL — ABNORMAL HIGH (ref 0.30–0.70)

## 2020-02-10 LAB — CULTURE, BLOOD (ROUTINE X 2)
Culture: NO GROWTH
Culture: NO GROWTH
Special Requests: ADEQUATE
Special Requests: ADEQUATE

## 2020-02-10 LAB — AEROBIC/ANAEROBIC CULTURE W GRAM STAIN (SURGICAL/DEEP WOUND)

## 2020-02-12 LAB — CULTURE, BLOOD (ROUTINE X 2)
Culture: NO GROWTH
Culture: NO GROWTH
Special Requests: ADEQUATE
Special Requests: ADEQUATE

## 2020-02-14 ENCOUNTER — Encounter (HOSPITAL_COMMUNITY): Payer: PRIVATE HEALTH INSURANCE

## 2020-03-05 NOTE — Progress Notes (Signed)
Upon report , noted patient breathing to be cheyne stokes, skin, pale and cold to touch, unresponsive. BP 47/20, O2 sat down to 80's. MD made aware. Charge nurse made aware. Family called. At [facility], noted patient stop breathing. Death verified by another RN.Son made aware of the death.

## 2020-03-05 NOTE — Progress Notes (Signed)
ANTICOAGULATION CONSULT NOTE - Follow Up Consult  Pharmacy Consult for Heparin Indication: acute DVT  No Known Allergies  Patient Measurements: Height: 6\' 1"  (185.4 cm) Weight: 88.5 kg (195 lb) IBW/kg (Calculated) : 79.9 Heparin Dosing Weight: 88.5 kg  Vital Signs: Temp: 97.9 F (36.6 C) (08/07 0430) Temp Source: Oral (08/07 0430) BP: 108/36 (08/07 0430) Pulse Rate: 65 (08/07 0430)  Labs: Recent Labs    02/06/20 1840 02/07/20 0523 02/07/20 0524 02/07/20 1758 02/08/20 0201 2020-02-20 0358  HGB  --  10.3*  --   --  8.3*  --   HCT  --  31.8*  --   --  26.0*  --   PLT  --  149*  --   --  173  --   HEPARINUNFRC   < >  --  0.41  --  0.33 0.80*  CREATININE  --  2.10*  --   --  2.14*  --   TROPONINIHS  --   --   --  33*  --   --    < > = values in this interval not displayed.    Estimated Creatinine Clearance: 26.4 mL/min (A) (by C-G formula based on SCr of 2.14 mg/dL (H)).   Medical History: Past Medical History:  Diagnosis Date  . Anemia   . Callus   . Diabetes mellitus without complication ([place])   . Gastrointestinal ulcer   . Hypertension    Assessment: 86 y.o. male presenting with back pain and found to have discitis/osteomyelitis, MSSA bacteremia, and new LLE DVT on 02/03/20. No PTA anticoagulation. Pharmacy has been consulted for IV heparin dosing.   Heparin level up to supratherapeutic (0.8) on gtt at 1700 units/hr. No bleeding noted.  Goal of Therapy:  Heparin level 0.3-0.7 units/ml Monitor platelets by anticoagulation protocol: Yes  Plan:  Decrease heparin infusion to 1550 units/hr Will f/u heparin level in 8 hours  Sherlon Handing, PharmD, BCPS Please see amion for complete clinical pharmacist phone list  Feb 20, 2020   4:50 AM

## 2020-03-05 NOTE — Death Summary Note (Addendum)
Death Summary  [name]:[id number] DOB: [birth year] DOA: 02-13-2020  PCP: Haywood Pao, MD  Admit date: 02-13-20 Date of Death: 02/20/2020 Time of Death: 7:40am Notification: Tisovec, Fransico Him, MD notified of death of 20-Feb-2020   History of present illness:  [name] is a 86 y.o. male with a history of HTN, DM 2 had presented with sudden onset of back pain associated with lower extremity weakness.Patient reports of generalized weakness, probably due to his low iron, he gets IV iron with his nephrologist twice a month. Patient subsequently had acute lower extremity weakness, and back pain unable to ambulate. He underwent Doppler study which did show anacute DVT. Patient underwent an MRI L-spine which found osteomyelitis/discitis at the L2-3 level with an epidural abscess measuring 9.2 cm extending down into the bilateral psoas muscles.  He was admitted to the hospitalist service.  Neurosurgery, infectious disease, IR, pharmacy will consulted.  He had fever and 3 out of 4 blood cultures grew MSSA.     Possible epidural abscess fluid collection/ Discitis / Osteomyelitis L3-4  -MRI of spine was reviewed 9 cm epidural fluid was identified extending into bilateral psoas -02/29/2020 status post CT-guided aspirationofL3-4 fluid drainage -reporting 13 cc of thick blood-tinged fluid Post central line/PICC placement pending clearance of cultures Neurosurgery following -Neurosurgery does not recommend neurosurgical indication at this time.  If he fails antibiotic treatment over time then discussion can be made paraspinal fluidcultures and cytology>>pending ID following, recommended continuing Cefazolin. PICC line in the next 24 hours if cultures remain clear  MSSA Bacteremia- - 3/4 blood cultures are growing staph ID following, recommend continuing cefazolin Blood cultures repeated so far clear Within 24 hours if cultures remain clear -02/22/2020 echocardiogram/TEE  completed, reporting no obvious vegetation,no signs of endocarditis  Acute DVT Continue on heparin drip with transition to p.o. oral anticoagulation     CKDIIIb Patient has known chronic kidney disease with GFR of 20, creatinine is at baseline.  Slight Was improving and stablizing  HTN Stable, continue diltiazem and Lasix   SOB- cxr with growing bilateral pleural effusion and question pulmonary edema versus pneumonia.  Likely pneumonia. IVF discontinued.  Continued Lasix    DM 2  Januvia held was on RISS.   Stage II pressure ulcer(POA)/right first toe,ball of the toe-nondraining Continue wound care Continue to monitor   Today, received a call from nursing that patient was actively dying. He was pronouced dead at 7:40am. Pt was seen and examined by me and he had past away. Son was notified and I did talk to him at bedside. He understood that it appeared it was time for his dad past away .      The results of significant diagnostics from this hospitalization (including imaging, microbiology, ancillary and laboratory) are listed below for reference.    Significant Diagnostic Studies: DG Lumbar Spine Complete  Result Date: 02/03/2020 CLINICAL DATA:  Low back pain beginning yesterday evening with bilateral leg weakness. No injury. EXAM: LUMBAR SPINE - COMPLETE 4+ VIEW COMPARISON:  Abdomen and pelvis CT, 02/26/2010. FINDINGS: No fracture, bone lesion or spondylolisthesis. Mild loss of disc height at L3-L4, L4-L5 and L5-S1. Significant endplate sclerosis at K4-M0, to a lesser degree at L2-L3. There are large anterior osteophytes at L3-L4 and L2-L3. Facet joints are relatively well preserved. Skeletal structures are diffusely demineralized. Calcifications along a normal caliber abdominal aorta. IMPRESSION: 1. No fracture or acute finding. 2. Degenerative changes as detailed which progressed compared to the prior CT. Electronically Signed  By: Lajean Manes M.D.   On:  02/03/2020 10:23   MR LUMBAR SPINE WO CONTRAST  Result Date: 02/03/2020 CLINICAL DATA:  Low back pain.  Leukocytosis EXAM: MRI LUMBAR SPINE WITHOUT CONTRAST TECHNIQUE: Multiplanar, multisequence MR imaging of the lumbar spine was performed. No intravenous contrast was administered. COMPARISON:  X-ray 02/03/2020.  CT 10/27/2009 FINDINGS: Segmentation:  Standard. Alignment:  Physiologic. Vertebrae: Extensive fluid signal within the L3-4 disc space with lobulated fluid collection extending into the paravertebral soft tissues measuring approximately 9.2 x 6.0 cm trans axially (series 9, image 19). Components of the fluid collection extend into the bilateral psoas muscles, left greater than right (series 9, image 21). There is a small amount of fluid signal within the anterior aspect of the L2-3 disc space which is contiguous with the previously described fluid collection anterior to L3-4 (series 6, images 10-12). There are endplate irregularities within the L3 and L4 vertebral bodies centered at the L3-4 disc space with extensive bone marrow edema. There are also mild endplate irregularity at the anterior aspect of the L2-3 disc space with associated marrow edema. Conus medullaris and cauda equina: Conus extends to the L1 level. Conus and cauda equina appear normal. No epidural fluid collection is identified. Paraspinal and other soft tissues: Prevertebral fluid collection, as described above. Intramuscular edema within the bilateral psoas muscles. Posterior paraspinal muscle atrophy. Cholelithiasis. Disc levels: T12-L1: Sagittal sequences only.  Unremarkable. L1-L2: No disc protrusion. Bony overgrowth in the region of the left foramen results in mild foraminal narrowing without evidence of neural impingement. No canal stenosis. L2-L3: No disc protrusion. Mild bilateral facet hypertrophy and ligamentum flavum buckling. No foraminal or canal stenosis. L3-L4: Disc height loss with small posterior disc osteophyte  complex. Bilateral facet arthropathy and ligamentum flavum hypertrophy. Findings result in moderate to severe left and mild right foraminal stenosis. Mild bilateral subarticular recess stenosis without canal stenosis. L4-L5: Shallow central disc protrusion. Mild bilateral facet hypertrophy. Extraforaminal osteophytic ridging is seen, left greater than right. No foraminal or canal stenosis. L5-S1: Right paracentral disc osteophyte complex with bilateral facet hypertrophy. Findings result in moderate right subarticular recess stenosis with impingement of the descending right S1 nerve root (series 9, image 33). There is mild-to-moderate bilateral foraminal stenosis. No canal stenosis. IMPRESSION: 1. Findings of discitis-osteomyelitis centered at the L3-4 level. There is also osteomyelitis-discitis at the anterior aspect of the L2-3 level. No evidence of epidural fluid collection or abscess. 2. Complex prevertebral fluid collection centered anterior to the L3-4 level measuring up to 9.2 cm. Fluid collection extends into the bilateral psoas muscles, both of which are edematous. 3. Multilevel degenerative changes of the lumbar spine, as above. Moderate right subarticular recess stenosis at L5-S1 with impingement of the descending right S1 nerve root. 4. Moderate-to-severe left and mild right foraminal stenosis at L3-L4. 5. Cholelithiasis. These results were called by telephone at the time of interpretation on 02/03/2020 at 2:37 pm to provider [facility] , who verbally acknowledged these results. Electronically Signed   By: Davina Poke D.O.   On: 02/03/2020 14:39   DG Chest Port 1 View  Result Date: 02/07/2020 CLINICAL DATA:  Shortness of breath. EXAM: PORTABLE CHEST 1 VIEW COMPARISON:  02/18/2020 FINDINGS: There are worsening coarse bilateral airspace opacities. The patient is rotated. The heart size is unchanged. There are growing bilateral pleural effusions, left greater than right. There is no pneumothorax. No  definite acute osseous abnormality. IMPRESSION: 1. Growing bilateral pleural effusions, left greater than right. 2. Worsening airspace  opacities bilaterally may represent worsening pulmonary edema or pneumonia. Electronically Signed   By: Constance Holster M.D.   On: 02/07/2020 18:24   DG CHEST PORT 1 VIEW  Result Date: 02/23/2020 CLINICAL DATA:  Postop EXAM: PORTABLE CHEST 1 VIEW COMPARISON:  None. FINDINGS: The cardiomediastinal silhouette is enlarged in contour. Likely small bilateral pleural effusions. No pneumothorax. Atherosclerotic calcifications of the aorta. Peribronchial cuffing. Hilar fullness and peripheral interstitial markings. Questionable nodule versus bone island of the RIGHT apex. Visualized abdomen is unremarkable. No acute osseous abnormality noted. IMPRESSION: 1. Findings suggestive of pulmonary edema with small bilateral pleural effusions. 2. Questionable nodule versus bone island of the RIGHT apex. Recommend follow-up PA and lateral radiograph. Electronically Signed   By: Valentino Saxon MD   On: 02/07/2020 09:39   ECHOCARDIOGRAM COMPLETE  Result Date: 02/04/2020    ECHOCARDIOGRAM REPORT   Patient Name:   BRISTON LAX Date of Exam: 02/04/2020 Medical Rec #:  [id number]         Height:       73.0 in Accession #:    [id number]        Weight:       195.0 lb Date of Birth:  [birth year]          BSA:          2.129 m Patient Age:    53 years          BP:           125/48 mmHg Patient Gender: M                 HR:           83 bpm. Exam Location:  Inpatient Procedure: 2D Echo, Cardiac Doppler and Color Doppler Indications:    Endocarditis I38  History:        Patient has no prior history of Echocardiogram examinations.                 Risk Factors:Hypertension and Diabetes.  Sonographer:    Jonelle Sidle Dance Referring Phys: [phone] SEYED A SHAHMEHDI  Sonographer Comments: Suboptimal parasternal window. IMPRESSIONS  1. Left ventricular ejection fraction, by estimation, is 55 to 60%. The left  ventricle has normal function. The left ventricle has no regional wall motion abnormalities. Left ventricular diastolic parameters are consistent with Grade I diastolic dysfunction (impaired relaxation).  2. Right ventricular systolic function is normal. The right ventricular size is normal. Tricuspid regurgitation signal is inadequate for assessing PA pressure.  3. Left atrial size was mildly dilated.  4. The mitral valve is degenerative. Trivial mitral valve regurgitation. No evidence of mitral stenosis.  5. The aortic valve is abnormal. Aortic valve regurgitation is not visualized. Mild aortic valve stenosis. Aortic valve mean gradient measures 17.0 mmHg.  6. The inferior vena cava is normal in size with greater than 50% respiratory variability, suggesting right atrial pressure of 3 mmHg. Conclusion(s)/Recommendation(s): No evidence of valvular vegetations on this transthoracic echocardiogram. Would recommend a transesophageal echocardiogram to exclude infective endocarditis if clinically indicated. FINDINGS  Left Ventricle: Left ventricular ejection fraction, by estimation, is 55 to 60%. The left ventricle has normal function. The left ventricle has no regional wall motion abnormalities. The left ventricular internal cavity size was normal in size. There is  no left ventricular hypertrophy. Left ventricular diastolic parameters are consistent with Grade I diastolic dysfunction (impaired relaxation). Right Ventricle: The right ventricular size is normal. No increase in right ventricular wall thickness. Right ventricular systolic  function is normal. Tricuspid regurgitation signal is inadequate for assessing PA pressure. Left Atrium: Left atrial size was mildly dilated. Right Atrium: Right atrial size was normal in size. Pericardium: There is no evidence of pericardial effusion. Mitral Valve: The mitral valve is degenerative in appearance. Mild to moderate mitral annular calcification. Trivial mitral valve  regurgitation. No evidence of mitral valve stenosis. Tricuspid Valve: The tricuspid valve is normal in structure. Tricuspid valve regurgitation is trivial. No evidence of tricuspid stenosis. Aortic Valve: The aortic valve is abnormal. Aortic valve regurgitation is not visualized. Mild aortic stenosis is present. There is severe calcifcation of the aortic valve. Aortic valve mean gradient measures 17.0 mmHg. Aortic valve peak gradient measures 25.0 mmHg. Aortic valve area, by VTI measures 1.37 cm. Pulmonic Valve: The pulmonic valve was not well visualized. Pulmonic valve regurgitation is trivial. No evidence of pulmonic stenosis. Aorta: The aortic root was not well visualized. Venous: The inferior vena cava is normal in size with greater than 50% respiratory variability, suggesting right atrial pressure of 3 mmHg. IAS/Shunts: No atrial level shunt detected by color flow Doppler.  LEFT VENTRICLE PLAX 2D LVIDd:         4.90 cm  Diastology LVIDs:         4.10 cm  LV e' lateral:   6.90 cm/s LV PW:         1.20 cm  LV E/e' lateral: 9.7 LV IVS:        1.00 cm  LV e' medial:    5.22 cm/s LVOT diam:     1.95 cm  LV E/e' medial:  12.8 LV SV:         64 LV SV Index:   30 LVOT Area:     2.99 cm  RIGHT VENTRICLE             IVC RV Basal diam:  2.40 cm     IVC diam: 1.60 cm RV S prime:     16.40 cm/s TAPSE (M-mode): 0.9 cm LEFT ATRIUM             Index       RIGHT ATRIUM           Index LA diam:        4.50 cm 2.11 cm/m  RA Area:     13.60 cm LA Vol (A2C):   82.2 ml 38.62 ml/m RA Volume:   28.80 ml  13.53 ml/m LA Vol (A4C):   59.4 ml 27.91 ml/m LA Biplane Vol: 72.6 ml 34.11 ml/m  AORTIC VALVE AV Area (Vmax):    1.25 cm AV Area (Vmean):   1.21 cm AV Area (VTI):     1.37 cm AV Vmax:           250.00 cm/s AV Vmean:          184.400 cm/s AV VTI:            0.467 m AV Peak Grad:      25.0 mmHg AV Mean Grad:      17.0 mmHg LVOT Vmax:         105.00 cm/s LVOT Vmean:        74.600 cm/s LVOT VTI:          0.214 m LVOT/AV VTI  ratio: 0.46  AORTA Ao Root diam: 3.90 cm MITRAL VALVE MV Area (PHT): 2.26 cm     SHUNTS MV Decel Time: 335 msec     Systemic VTI:  0.21 m MV E  velocity: 66.70 cm/s   Systemic Diam: 1.95 cm MV A velocity: 128.00 cm/s MV E/A ratio:  0.52 Cherlynn Kaiser MD Electronically signed by Cherlynn Kaiser MD Signature Date/Time: 02/04/2020/6:38:32 PM    Final    ECHO TEE  Result Date: 02/04/2020    TRANSESOPHOGEAL ECHO REPORT   Patient Name:   LAURIE LOVEJOY Date of Exam: 02/29/2020 Medical Rec #:  [id number]         Height:       73.0 in Accession #:    [id number]        Weight:       195.0 lb Date of Birth:  [birth year]          BSA:          2.129 m Patient Age:    89 years          BP:           104/32 mmHg Patient Gender: M                 HR:           68 bpm. Exam Location:  Inpatient Procedure: TEE-Intraopertive, Color Doppler and Cardiac Doppler Indications:     Bacteremia 790.7 / R78.81  History:         Patient has prior history of Echocardiogram examinations, most                  recent 02/04/2020. Risk Factors:Hypertension and Diabetes.                  Anemia.  Sonographer:     Darlina Sicilian RDCS Referring Phys:  [id number] HAO MENG Diagnosing Phys: Skeet Latch MD  Sonographer Comments: Transgastic imaging not obtained PROCEDURE: After discussion of the risks and benefits of a TEE, an informed consent was obtained from the patient. The transesophogeal probe was passed without difficulty through the esophogus of the patient. Local oropharyngeal anesthetic was provided with viscous lidocaine. Sedation performed by different physician. The patient was monitored while under deep sedation. Anesthestetic sedation was provided intravenously by Anesthesiology: 160.99m of Propofol. The patient's vital signs; including heart rate, blood pressure, and oxygen saturation; remained stable throughout the procedure. The patient developed no complications during the procedure. IMPRESSIONS  1. Left ventricular ejection fraction,  by estimation, is 55 to 60%. The left ventricle has normal function. The left ventricle has no regional wall motion abnormalities.  2. Right ventricular systolic function is normal. The right ventricular size is normal.  3. Left atrial size was mildly dilated. No left atrial/left atrial appendage thrombus was detected.  4. There is a tiny (0.5 x 0.3 cm) fixed, hyperechoic density on the posterior mitral valve leaflet, consistent with MAC. There is no evidence of endocarditis. The mitral valve is normal in structure. Mild mitral valve regurgitation. No evidence of mitral stenosis.  5. Aortic valve leaflet motion is restricted. No gradients were obtained because we wer unable to get transgastric images.. The aortic valve is normal in structure. Aortic valve regurgitation is not visualized. No aortic stenosis is present.  6. Descending aorta not well visualized.  7. The inferior vena cava is normal in size with greater than 50% respiratory variability, suggesting right atrial pressure of 3 mmHg. Conclusion(s)/Recommendation(s): No evidence of vegetation/infective endocarditis on this transesophageal echocardiogram. FINDINGS  Left Ventricle: Left ventricular ejection fraction, by estimation, is 55 to 60%. The left ventricle has normal function. The left ventricle has no regional wall motion abnormalities. The  left ventricular internal cavity size was normal in size. There is  no left ventricular hypertrophy. Right Ventricle: The right ventricular size is normal. No increase in right ventricular wall thickness. Right ventricular systolic function is normal. Left Atrium: Left atrial size was mildly dilated. No left atrial/left atrial appendage thrombus was detected. Right Atrium: Right atrial size was normal in size. Pericardium: There is no evidence of pericardial effusion. Mitral Valve: There is a tiny (0.5 x 0.3 cm) fixed, hyperechoic density on the posterior mitral valve leaflet, consistent with MAC. There is no  evidence of endocarditis. The mitral valve is normal in structure. There is mild thickening of the mitral valve leaflet(s). Normal mobility of the mitral valve leaflets. Mild mitral annular calcification. Mild mitral valve regurgitation. No evidence of mitral valve stenosis. Tricuspid Valve: The tricuspid valve is normal in structure. Tricuspid valve regurgitation is not demonstrated. No evidence of tricuspid stenosis. Aortic Valve: Aortic valve leaflet motion is restricted. No gradients were obtained because we wer unable to get transgastric images. The aortic valve is normal in structure.. There is moderate thickening and moderate calcification of the aortic valve. Aortic valve regurgitation is not visualized. No aortic stenosis is present. There is moderate thickening of the aortic valve. There is moderate calcification of the aortic valve. Pulmonic Valve: The pulmonic valve was normal in structure. Pulmonic valve regurgitation is not visualized. No evidence of pulmonic stenosis. Aorta: Descending aorta not well visualized. The aortic root is normal in size and structure. Venous: The inferior vena cava is normal in size with greater than 50% respiratory variability, suggesting right atrial pressure of 3 mmHg. IAS/Shunts: No atrial level shunt detected by color flow Doppler. Skeet Latch MD Electronically signed by Skeet Latch MD Signature Date/Time: 03/03/2020/11:47:32 AM    Final (Updated)    VAS [place] LOWER EXTREMITY VENOUS (DVT) ([facility] and [facility] 7a-7p)  Result Date: 02/03/2020  Lower Venous DVTStudy Indications: Edema.  Comparison Study: No prior study on file Performing Technologist: Sharion Dove RVS  Examination Guidelines: A complete evaluation includes B-mode imaging, spectral Doppler, color Doppler, and power Doppler as needed of all accessible portions of each vessel. Bilateral testing is considered an integral part of a complete examination. Limited examinations for reoccurring indications may be  performed as noted. The reflux portion of the exam is performed with the patient in reverse Trendelenburg.  +-----+---------------+---------+-----------+----------+--------------+ RIGHTCompressibilityPhasicitySpontaneityPropertiesThrombus Aging +-----+---------------+---------+-----------+----------+--------------+ CFV  Full           Yes      Yes                                 +-----+---------------+---------+-----------+----------+--------------+   +---------+---------------+---------+-----------+----------+--------------+ LEFT     CompressibilityPhasicitySpontaneityPropertiesThrombus Aging +---------+---------------+---------+-----------+----------+--------------+ CFV      Full           Yes      Yes                                 +---------+---------------+---------+-----------+----------+--------------+ SFJ      Full                                                        +---------+---------------+---------+-----------+----------+--------------+ FV Prox  Full                                                        +---------+---------------+---------+-----------+----------+--------------+  FV Mid   Full                                                        +---------+---------------+---------+-----------+----------+--------------+ FV DistalFull                                                        +---------+---------------+---------+-----------+----------+--------------+ PFV      Full                                                        +---------+---------------+---------+-----------+----------+--------------+ POP      Full           Yes      Yes                                 +---------+---------------+---------+-----------+----------+--------------+ PTV      Partial                                      Acute          +---------+---------------+---------+-----------+----------+--------------+ PERO     Partial                                       Acute          +---------+---------------+---------+-----------+----------+--------------+     Summary: RIGHT: - No evidence of common femoral vein obstruction.  LEFT: - Findings consistent with acute deep vein thrombosis involving the left posterior tibial veins, and left peroneal veins.  *See table(s) above for measurements and observations. Electronically signed by Deitra Mayo MD on 02/03/2020 at 4:02:00 PM.    Final    IR LUMBAR [facility] ASPIRATION W/IMG GUIDE  Result Date: 02/23/2020 INDICATION: 86 year old male presenting with sudden onset of back pain and lower extremity weakness for 3 days. MRI of the lumbar spine showed L2-L3 and L3-L4 discitis/osteomyelitis. He comes to our service for a fluoroscopy guided disc aspiration. EXAM: Fluoroscopy guided L3-4 disc aspiration MEDICATIONS: The patient is currently admitted to the hospital and receiving intravenous antibiotics. The antibiotics were administered within an appropriate time frame prior to the initiation of the procedure. ANESTHESIA/SEDATION: Fentanyl 50 mcg IV; Versed 0.5 mg IV Moderate Sedation Time:  15 The patient was continuously monitored during the procedure by the interventional radiology nurse under my direct supervision. COMPLICATIONS: None immediate. PROCEDURE: Informed written consent was obtained from the patient after a thorough discussion of the procedural risks, benefits and alternatives. All questions were addressed. Maximal Sterile Barrier Technique was utilized including caps, mask, sterile gowns, sterile gloves, sterile drape, hand hygiene and skin antiseptic. A timeout was performed prior to the initiation of the procedure. The a hand was made to lie prone in the angiography table. The lower back was prepped and draped  in the usual sterile fashion. The L3-4 disc space was identified under fluoroscopy in the frontal and lateral planes. The skin was infiltrated with lidocaine 1% approximately 4.5 cm to  the left of midline. Subsequently, an 18 gauge x 15 cm needle was advanced under fluoroscopic guidance into the L3-4 disc space. Then, a 22 gauge x 20 cm spinal needle was advanced through the 18 gauge needle into the disc space. Attempted aspiration through the 22 gauge needle proved unsuccessful. The needle was removed and the stylet of the 18 gauge needle was introduced. The 18 gauge needle was then advanced into the center of the disc space. The stylet was removed and 13 cc of thick blood tinged fluid was aspirated. The needle was subsequently withdrawn. The skin was seen and dressed with a sterile bandage. The patient tolerated the procedure well without incident or complication. IMPRESSION: Successful fluoroscopy guided L3-4 disc aspiration. Electronically Signed   By: Pedro Earls M.D.   On: 02/26/2020 11:56    Microbiology: Recent Results (from the past 240 hour(s))  SARS Coronavirus 2 by RT PCR (hospital order, performed in [facility] hospital lab) Nasopharyngeal Nasopharyngeal Swab     Status: None   Collection Time: 02/03/20  2:27 PM   Specimen: Nasopharyngeal Swab  Result Value Ref Range Status   SARS Coronavirus 2 NEGATIVE NEGATIVE Final    Comment: (NOTE) SARS-CoV-2 target nucleic acids are NOT DETECTED.  The SARS-CoV-2 RNA is generally detectable in upper and lower respiratory specimens during the acute phase of infection. The lowest concentration of SARS-CoV-2 viral copies this assay can detect is 250 copies / mL. A negative result does not preclude SARS-CoV-2 infection and should not be used as the sole basis for treatment or other patient management decisions.  A negative result may occur with improper specimen collection / handling, submission of specimen other than nasopharyngeal swab, presence of viral mutation(s) within the areas targeted by this assay, and inadequate number of viral copies (<250 copies / mL). A negative result must be combined with  clinical observations, patient history, and epidemiological information.  Fact Sheet for Patients:   StrictlyIdeas.no  Fact Sheet for Healthcare Providers: BankingDealers.co.za  This test is not yet approved or  cleared by the [place] FDA and has been authorized for detection and/or diagnosis of SARS-CoV-2 by FDA under an Emergency Use Authorization (EUA).  This EUA will remain in effect (meaning this test can be used) for the duration of the COVID-19 declaration under Section 564(b)(1) of the Act, 21 U.S.C. section 360bbb-3(b)(1), unless the authorization is terminated or revoked sooner.  Performed at [facility], [place] [address] [place], [place] [address]   Blood culture (routine x 2)     Status: Abnormal   Collection Time: 02/03/20  4:41 PM   Specimen: BLOOD LEFT HAND  Result Value Ref Range Status   Specimen Description BLOOD LEFT HAND  Final   Special Requests   Final    BOTTLES DRAWN AEROBIC AND ANAEROBIC Blood Culture adequate volume   Culture  Setup Time   Final    GRAM POSITIVE COCCI IN CLUSTERS IN BOTH AEROBIC AND ANAEROBIC BOTTLES Organism ID to follow CRITICAL RESULT CALLED TO, READ BACK BY AND VERIFIED WITH: A. Rogers Blocker PharmD 8:50 02/04/20 ([email or web address]) Performed at [facility], [place] [address] [place], [place] [address]    Culture STAPHYLOCOCCUS AUREUS (A)  Final   Report Status 02/06/2020 FINAL  Final   Organism ID, Bacteria STAPHYLOCOCCUS  AUREUS  Final      Susceptibility   Staphylococcus aureus - MIC*    CIPROFLOXACIN <=0.5 SENSITIVE Sensitive     ERYTHROMYCIN >=8 RESISTANT Resistant     GENTAMICIN <=0.5 SENSITIVE Sensitive     OXACILLIN 1 SENSITIVE Sensitive     TETRACYCLINE <=1 SENSITIVE Sensitive     VANCOMYCIN 1 SENSITIVE Sensitive     TRIMETH/SULFA <=10 SENSITIVE Sensitive     CLINDAMYCIN <=0.25 SENSITIVE Sensitive     RIFAMPIN <=0.5 SENSITIVE Sensitive     Inducible Clindamycin NEGATIVE  Sensitive     * STAPHYLOCOCCUS AUREUS  Blood Culture ID Panel (Reflexed)     Status: Abnormal   Collection Time: 02/03/20  4:41 PM  Result Value Ref Range Status   Enterococcus species NOT DETECTED NOT DETECTED Final   Listeria monocytogenes NOT DETECTED NOT DETECTED Final   Staphylococcus species DETECTED (A) NOT DETECTED Final    Comment: CRITICAL RESULT CALLED TO, READ BACK BY AND VERIFIED WITH: A. Rogers Blocker PharmD 8:50 02/04/20 ([email or web address])    Staphylococcus aureus (BCID) DETECTED (A) NOT DETECTED Final    Comment: Methicillin (oxacillin) susceptible Staphylococcus aureus (MSSA). Preferred therapy is anti staphylococcal beta lactam antibiotic (Cefazolin or Nafcillin), unless clinically contraindicated. CRITICAL RESULT CALLED TO, READ BACK BY AND VERIFIED WITH: A. Rogers Blocker PharmD 8:50 02/04/20 ([email or web address])    Methicillin resistance NOT DETECTED NOT DETECTED Final   Streptococcus species NOT DETECTED NOT DETECTED Final   Streptococcus agalactiae NOT DETECTED NOT DETECTED Final   Streptococcus pneumoniae NOT DETECTED NOT DETECTED Final   Streptococcus pyogenes NOT DETECTED NOT DETECTED Final   Acinetobacter baumannii NOT DETECTED NOT DETECTED Final   Enterobacteriaceae species NOT DETECTED NOT DETECTED Final   Enterobacter cloacae complex NOT DETECTED NOT DETECTED Final   Escherichia coli NOT DETECTED NOT DETECTED Final   Klebsiella oxytoca NOT DETECTED NOT DETECTED Final   Klebsiella pneumoniae NOT DETECTED NOT DETECTED Final   Proteus species NOT DETECTED NOT DETECTED Final   Serratia marcescens NOT DETECTED NOT DETECTED Final   Haemophilus influenzae NOT DETECTED NOT DETECTED Final   Neisseria meningitidis NOT DETECTED NOT DETECTED Final   Pseudomonas aeruginosa NOT DETECTED NOT DETECTED Final   Candida albicans NOT DETECTED NOT DETECTED Final   Candida glabrata NOT DETECTED NOT DETECTED Final   Candida krusei NOT DETECTED NOT DETECTED Final   Candida parapsilosis NOT DETECTED NOT DETECTED  Final   Candida tropicalis NOT DETECTED NOT DETECTED Final    Comment: Performed at [facility], [place] [address] [place], [place] [address]  Blood culture (routine x 2)     Status: Abnormal   Collection Time: 02/03/20  4:45 PM   Specimen: BLOOD RIGHT HAND  Result Value Ref Range Status   Specimen Description BLOOD RIGHT HAND  Final   Special Requests   Final    BOTTLES DRAWN AEROBIC AND ANAEROBIC Blood Culture adequate volume   Culture  Setup Time   Final    GRAM POSITIVE COCCI IN CLUSTERS IN BOTH AEROBIC AND ANAEROBIC BOTTLES CRITICAL VALUE NOTED.  VALUE IS CONSISTENT WITH PREVIOUSLY REPORTED AND CALLED VALUE.    Culture (A)  Final    STAPHYLOCOCCUS AUREUS SUSCEPTIBILITIES PERFORMED ON PREVIOUS CULTURE WITHIN THE LAST 5 DAYS. Performed at [facility], [place] [address], [place], [place] [address]    Report Status 02/06/2020 FINAL  Final  Culture, blood (routine x 2)     Status: None (Preliminary result)   Collection Time: 02/12/2020  5:58 AM   Specimen:  BLOOD  Result Value Ref Range Status   Specimen Description BLOOD RIGHT HAND  Final   Special Requests   Final    BOTTLES DRAWN AEROBIC ONLY Blood Culture adequate volume   Culture   Final    NO GROWTH 4 DAYS Performed at [facility], [address] [place], [place] [address]    Report Status PENDING  Incomplete  Culture, blood (routine x 2)     Status: None (Preliminary result)   Collection Time: 02/16/2020  6:04 AM   Specimen: BLOOD  Result Value Ref Range Status   Specimen Description BLOOD LEFT HAND  Final   Special Requests   Final    BOTTLES DRAWN AEROBIC ONLY Blood Culture adequate volume   Culture   Final    NO GROWTH 4 DAYS Performed at [facility], [place] [address], [place], [place] [address]    Report Status PENDING  Incomplete  Aerobic/Anaerobic Culture (surgical/deep wound)     Status: None (Preliminary result)   Collection Time: 02/18/2020 11:35 AM   Specimen: Abscess; Fine Needle  Aspirate  Result Value Ref Range Status   Specimen Description ABSCESS  Final   Special Requests L3 L4 DISC  Final   Gram Stain   Final    ABUNDANT WBC PRESENT, PREDOMINANTLY PMN FEW GRAM POSITIVE COCCI Performed at [facility], [address], [place], [place] [address]    Culture   Final    RARE STAPHYLOCOCCUS AUREUS NO ANAEROBES ISOLATED; CULTURE IN PROGRESS FOR 5 DAYS    Report Status PENDING  Incomplete   Organism ID, Bacteria STAPHYLOCOCCUS AUREUS  Final      Susceptibility   Staphylococcus aureus - MIC*    CIPROFLOXACIN <=0.5 SENSITIVE Sensitive     ERYTHROMYCIN >=8 RESISTANT Resistant     GENTAMICIN <=0.5 SENSITIVE Sensitive     OXACILLIN 1 SENSITIVE Sensitive     TETRACYCLINE <=1 SENSITIVE Sensitive     VANCOMYCIN <=0.5 SENSITIVE Sensitive     TRIMETH/SULFA <=10 SENSITIVE Sensitive     CLINDAMYCIN <=0.25 SENSITIVE Sensitive     RIFAMPIN <=0.5 SENSITIVE Sensitive     Inducible Clindamycin NEGATIVE Sensitive     * RARE STAPHYLOCOCCUS AUREUS  Acid Fast Smear (AFB)     Status: None   Collection Time: 02/10/2020 11:35 AM  Result Value Ref Range Status   AFB Specimen Processing Concentration  Final   Acid Fast Smear Negative  Final    Comment: (NOTE) Performed At: [facility] [place], [place] [id number] Rush Farmer MD [id number]    Source (AFB) ABSCESS  Final    Comment: L3 L4 DISC Performed at [facility], [place] [address] [place], [place] [address]   Fungus Culture With Stain     Status: None (Preliminary result)   Collection Time: 03/03/2020 11:35 AM  Result Value Ref Range Status   Fungus Stain Final report  Final    Comment: (NOTE) Performed At: [facility] [place], [place] [id number] Rush Farmer MD [id number]    Fungus (Mycology) Culture PENDING  Incomplete   Fungal Source ABSCESS  Final    Comment: L3 L4 DISC Performed at [facility], [place] [address] [place],  [address]    Fungus Culture Result     Status: None   Collection Time: 02/06/2020 11:35 AM  Result Value Ref Range Status   Result 1 Comment  Final    Comment: (NOTE) KOH/Calcofluor preparation:  no fungus observed. Performed At: [facility] [place], [place] [id number] Rush Farmer MD AS:[id number]   Culture, blood (routine x 2)     Status: None (Preliminary result)   Collection Time: 02/07/20  2:49 PM   Specimen: BLOOD LEFT HAND  Result Value Ref Range Status   Specimen Description BLOOD LEFT HAND  Final   Special Requests   Final    BOTTLES DRAWN AEROBIC AND ANAEROBIC Blood Culture adequate volume   Culture   Final    NO GROWTH 2 DAYS Performed at [facility], [address], [place], [place] [address]    Report Status PENDING  Incomplete  Culture, blood (routine x 2)     Status: None (Preliminary result)   Collection Time: 02/07/20  2:57 PM   Specimen: BLOOD RIGHT HAND  Result Value Ref Range Status   Specimen Description BLOOD RIGHT HAND  Final   Special Requests   Final    BOTTLES DRAWN AEROBIC ONLY Blood Culture adequate volume   Culture   Final    NO GROWTH 2 DAYS Performed at [facility], [place] [address], [place], [place] [address]    Report Status PENDING  Incomplete     Labs: Basic Metabolic Panel: Recent Labs  Lab 02/27/2020 0558 02/04/2020 0558 02/06/20 0429 02/06/20 0429 02/07/20 0523 02/07/20 0523 02/08/20 0201 03-03-2020 0358  NA 136  --  139  --  141  --  142 137  K 3.8   < > 3.5   < > 3.7   < > 3.4* 4.6  CL 105  --  107  --  105  --  108 106  CO2 21*  --  20*  --  22  --  21* 14*  GLUCOSE 97  --  130*  --  146*  --  183* 216*  BUN 62*  --  65*  --  64*  --  73* 93*  CREATININE 2.59*  --  2.52*  --  2.10*  --  2.14* 3.25*  CALCIUM 8.3*  --  8.3*  --  8.5*  --  8.5* 8.3*   < > = values in this interval not displayed.   Liver Function Tests: Recent Labs  Lab 01/21/2020 2151 02/24/2020 0558  AST 13* 16  ALT 14 12  ALKPHOS  102 68  BILITOT 0.4 0.9  PROT 6.9 5.3*  ALBUMIN 3.4* 2.0*   No results for input(s): LIPASE, AMYLASE in the last 168 hours. No results for input(s): AMMONIA in the last 168 hours. CBC: Recent Labs  Lab 01/21/2020 2151 01/26/2020 2151 02/04/20 0213 02/14/2020 0558 02/06/20 0334 02/07/20 0523 02/08/20 0201  WBC 11.8*   < > 14.6* 10.4 7.3 7.4 8.3  NEUTROABS 10.7*  --   --   --   --   --   --   HGB 12.0*   < > 11.5* 10.3* 10.1* 10.3* 8.3*  HCT 39.2   < > 36.4* 33.6* 32.0* 31.8* 26.0*  MCV 105.7*   < > 104.0* 107.7* 102.9* 103.2* 103.6*  PLT 149*   < > 156 149* 150 149* 173   < > = values in this interval not displayed.   Cardiac Enzymes: No results for input(s): CKTOTAL, CKMB, CKMBINDEX, TROPONINI in the last 168 hours. D-Dimer No results for input(s): DDIMER in the last 72 hours. BNP: Invalid input(s): POCBNP CBG: Recent Labs  Lab 02/04/20 1155 02/04/20 1718 02/04/20 2110 02/28/2020 0655 02/08/2020 1241  GLUCAP 165* 121* 109* 101*  101* 103*   Anemia work up No results for input(s): VITAMINB12, FOLATE, FERRITIN, TIBC, IRON, RETICCTPCT in the last 72 hours. Urinalysis    Component Value Date/Time   COLORURINE YELLOW 02/03/2020 1207   APPEARANCEUR HAZY (A) 02/03/2020 1207   LABSPEC 1.012 02/03/2020 1207   PHURINE 5.0 02/03/2020 1207   GLUCOSEU NEGATIVE 02/03/2020 1207   HGBUR SMALL (A) 02/03/2020 1207   [place] 02/03/2020 1207   [place] 02/03/2020 1207   PROTEINUR 100 (A) 02/03/2020 1207   NITRITE NEGATIVE 02/03/2020 1207   LEUKOCYTESUR MODERATE (A) 02/03/2020 1207   Sepsis Labs Invalid input(s): PROCALCITONIN,  WBC,  LACTICIDVEN     SIGNED:  Nolberto Hanlon, MD  [facility] 02-20-2020, 2:23 PM Pager   If 7PM-7AM, please contact night-coverage [email or web address] Password [email or web address]

## 2020-03-05 DEATH — deceased

## 2020-03-06 LAB — FUNGUS CULTURE WITH STAIN

## 2020-03-06 LAB — FUNGUS CULTURE RESULT

## 2020-03-06 LAB — FUNGAL ORGANISM REFLEX

## 2020-03-11 ENCOUNTER — Inpatient Hospital Stay: Payer: PRIVATE HEALTH INSURANCE | Admitting: Family

## 2020-03-19 LAB — ACID FAST CULTURE WITH REFLEXED SENSITIVITIES (MYCOBACTERIA): Acid Fast Culture: NEGATIVE

## 2020-05-14 ENCOUNTER — Ambulatory Visit: Payer: PRIVATE HEALTH INSURANCE | Admitting: Podiatry

## 2020-08-16 IMAGING — XA IR DISC ASPIRATION W/IMAG GUIDE
4 series · 16 of 24 positions shown · non-contrast
Comparison: none

INDICATION: 89-year-old male presenting with sudden onset of back pain and lower
extremity weakness for 3 days. MRI of the lumbar spine showed L2-L3
and L3-L4 discitis/osteomyelitis. He comes to our service for a
fluoroscopy guided disc aspiration.

[Series 1: fl angio · 2 acquisitions, 5 frames shown (1 of 3)]
[im 1/2]
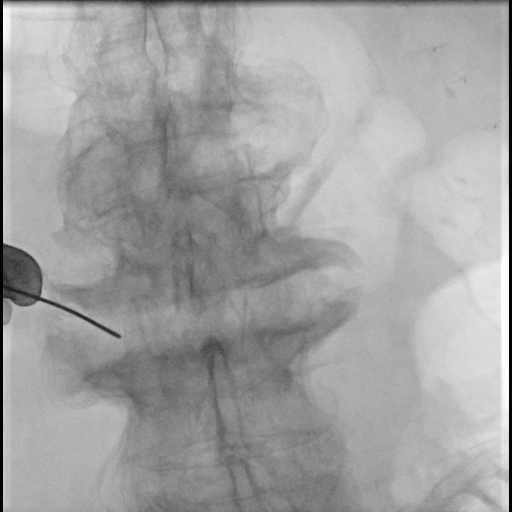
[im 1/2]
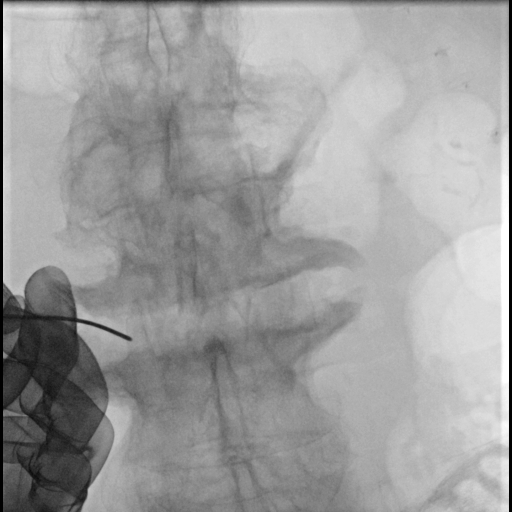
[im 1/2]
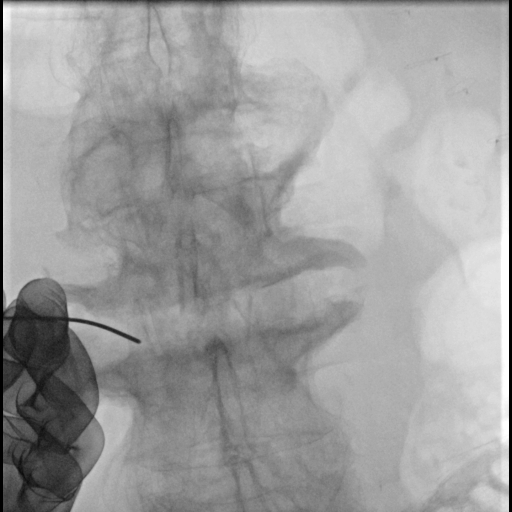
[im 2/2]
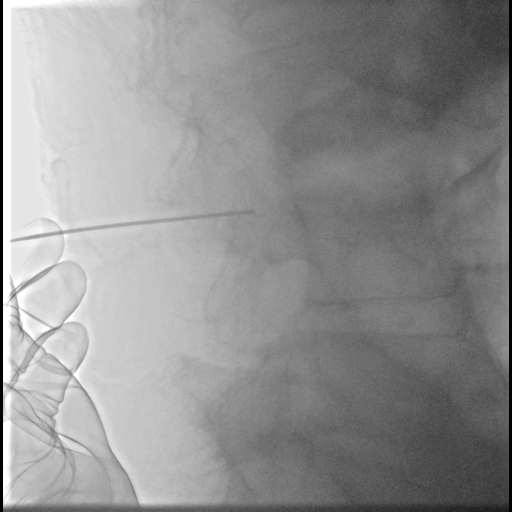
[im 2/2]
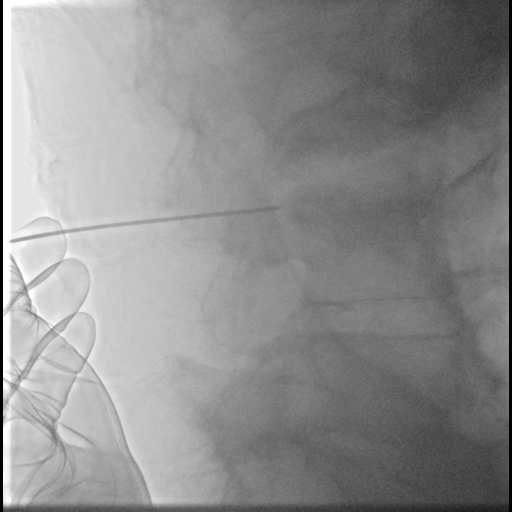

[Series 2: fl angio · 2 of 25 frames shown (2 of 3)]
[frame 7/25]
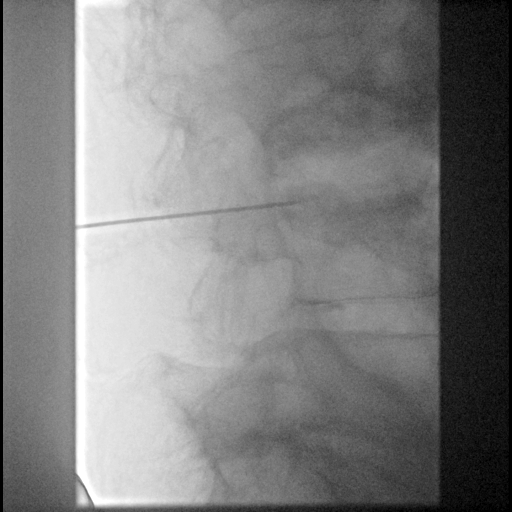
[frame 13/25]
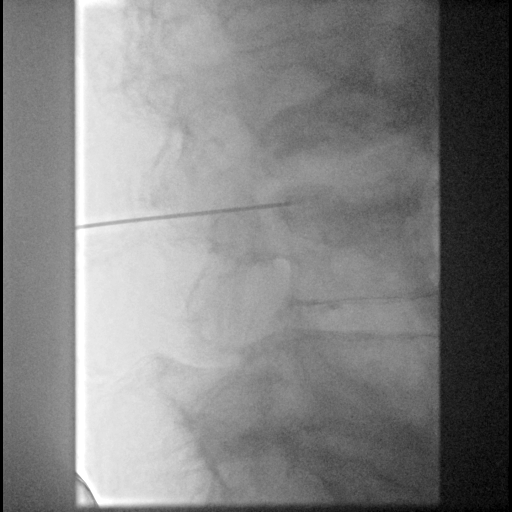

[Series 3: fl angio · 2 acquisitions, 5 frames shown (3 of 3)]
[im 1/2]
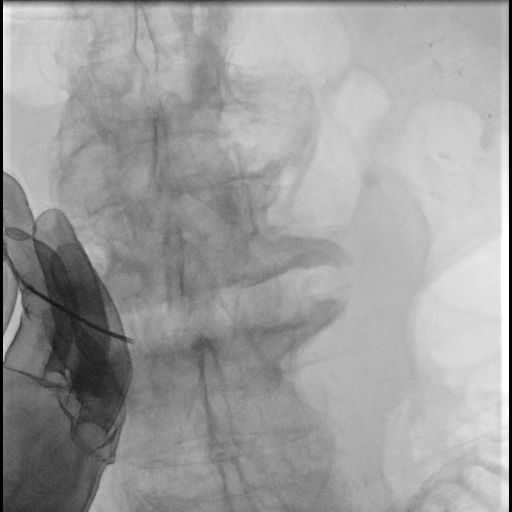
[im 1/2]
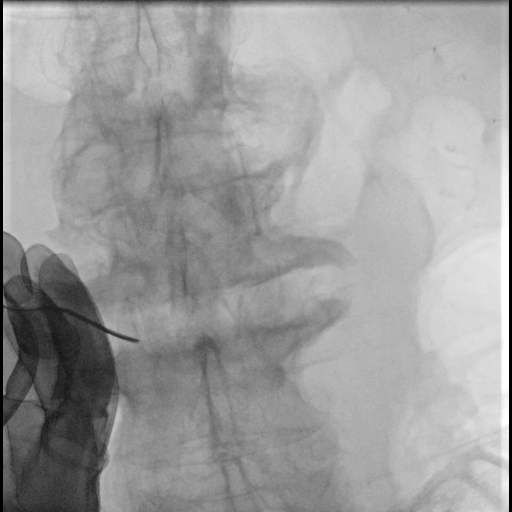
[im 1/2]
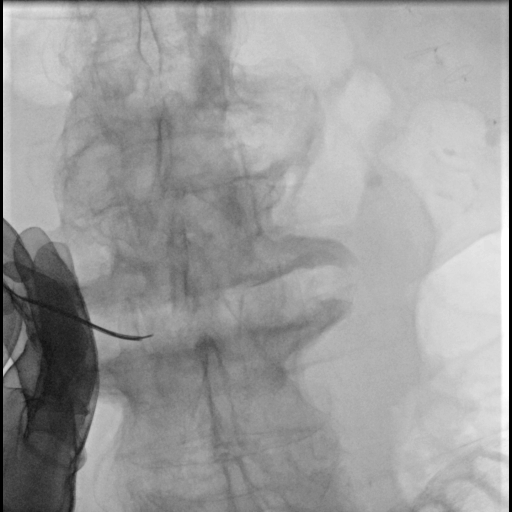
[im 2/2]
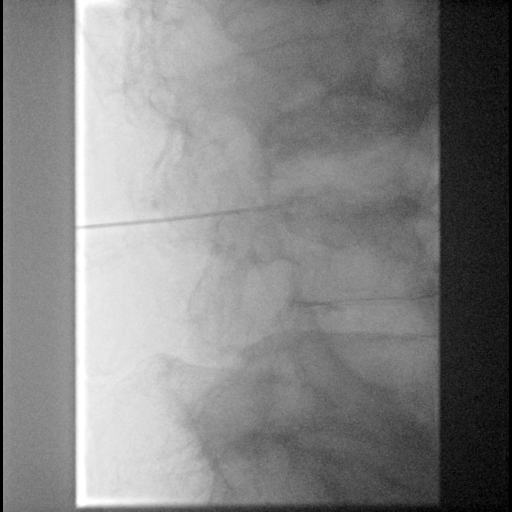
[im 2/2]
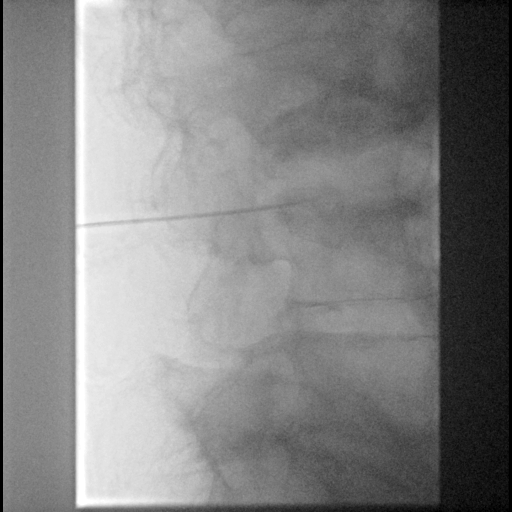

[Series 300: spine · 4 of 6 slices shown]
[im 1/6]
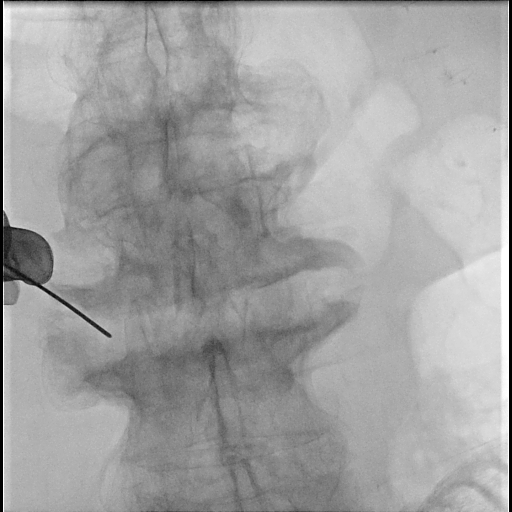
[im 3/6]
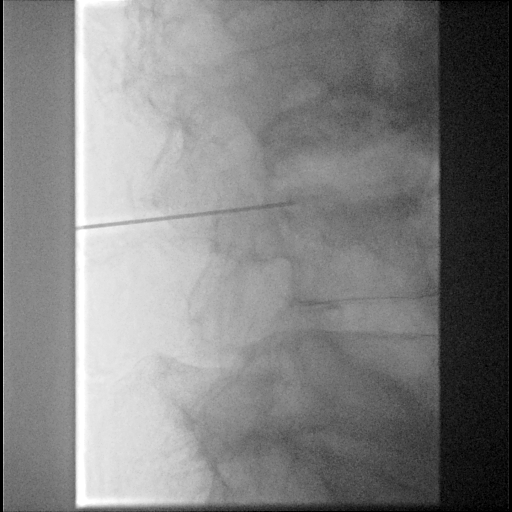
[im 4/6]
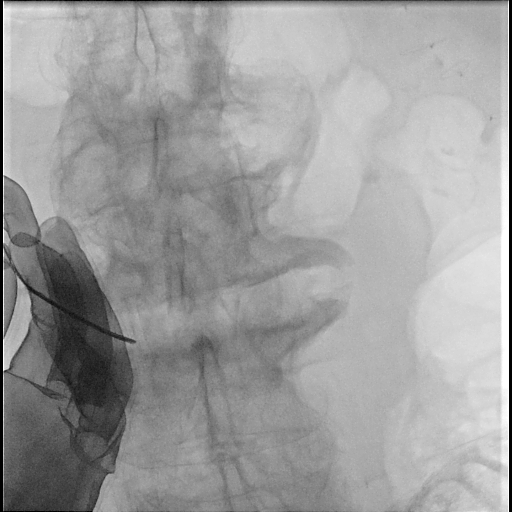
[im 6/6]
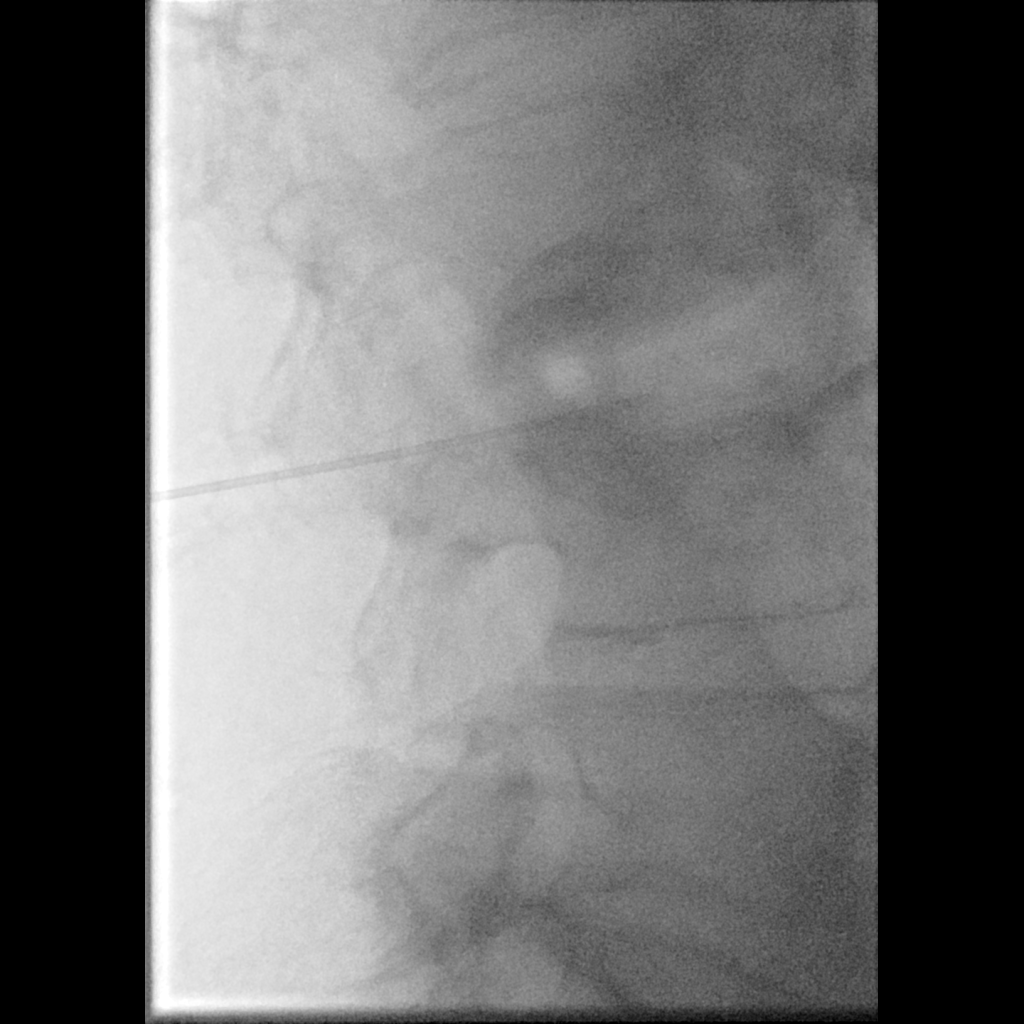

[16 of 24 positions shown; findings below may reference images not displayed]

EXAM:
Fluoroscopy guided L3-4 disc aspiration

MEDICATIONS:
The patient is currently admitted to the hospital and receiving
intravenous antibiotics. The antibiotics were administered within an
appropriate time frame prior to the initiation of the procedure.

ANESTHESIA/SEDATION:
Fentanyl 50 mcg IV; Versed 0.5 mg IV

Moderate Sedation Time:  15

The patient was continuously monitored during the procedure by the
interventional radiology nurse under my direct supervision.

COMPLICATIONS:
None immediate.

PROCEDURE:
Informed written consent was obtained from the patient after a
thorough discussion of the procedural risks, benefits and
alternatives. All questions were addressed. Maximal Sterile Barrier
Technique was utilized including caps, mask, sterile gowns, sterile
gloves, sterile drape, hand hygiene and skin antiseptic. A timeout
was performed prior to the initiation of the procedure.

The a hand was made to lie prone in the angiography table. The lower
back was prepped and draped in the usual sterile fashion. The L3-4
disc space was identified under fluoroscopy in the frontal and
lateral planes. The skin was infiltrated with lidocaine 1%
approximately 4.5 cm to the left of midline. Subsequently, an 18
gauge x 15 cm needle was advanced under fluoroscopic guidance into
the L3-4 disc space. Then, a 22 gauge x 20 cm spinal needle was
advanced through the 18 gauge needle into the disc space. Attempted
aspiration through the 22 gauge needle proved unsuccessful. The
needle was removed and the stylet of the 18 gauge needle was
introduced. The 18 gauge needle was then advanced into the center of
the disc space. The stylet was removed and 13 cc of thick blood
tinged fluid was aspirated. The needle was subsequently withdrawn.
The skin was seen and dressed with a sterile bandage. The patient
tolerated the procedure well without incident or complication.
IMPRESSION: Successful fluoroscopy guided L3-4 disc aspiration.

## 2020-08-16 IMAGING — DX DG CHEST 1V PORT
1 series · 1 of 1 positions shown · non-contrast
Comparison: None.

CLINICAL DATA: Postop

EXAM:
PORTABLE CHEST 1 VIEW

[chest]
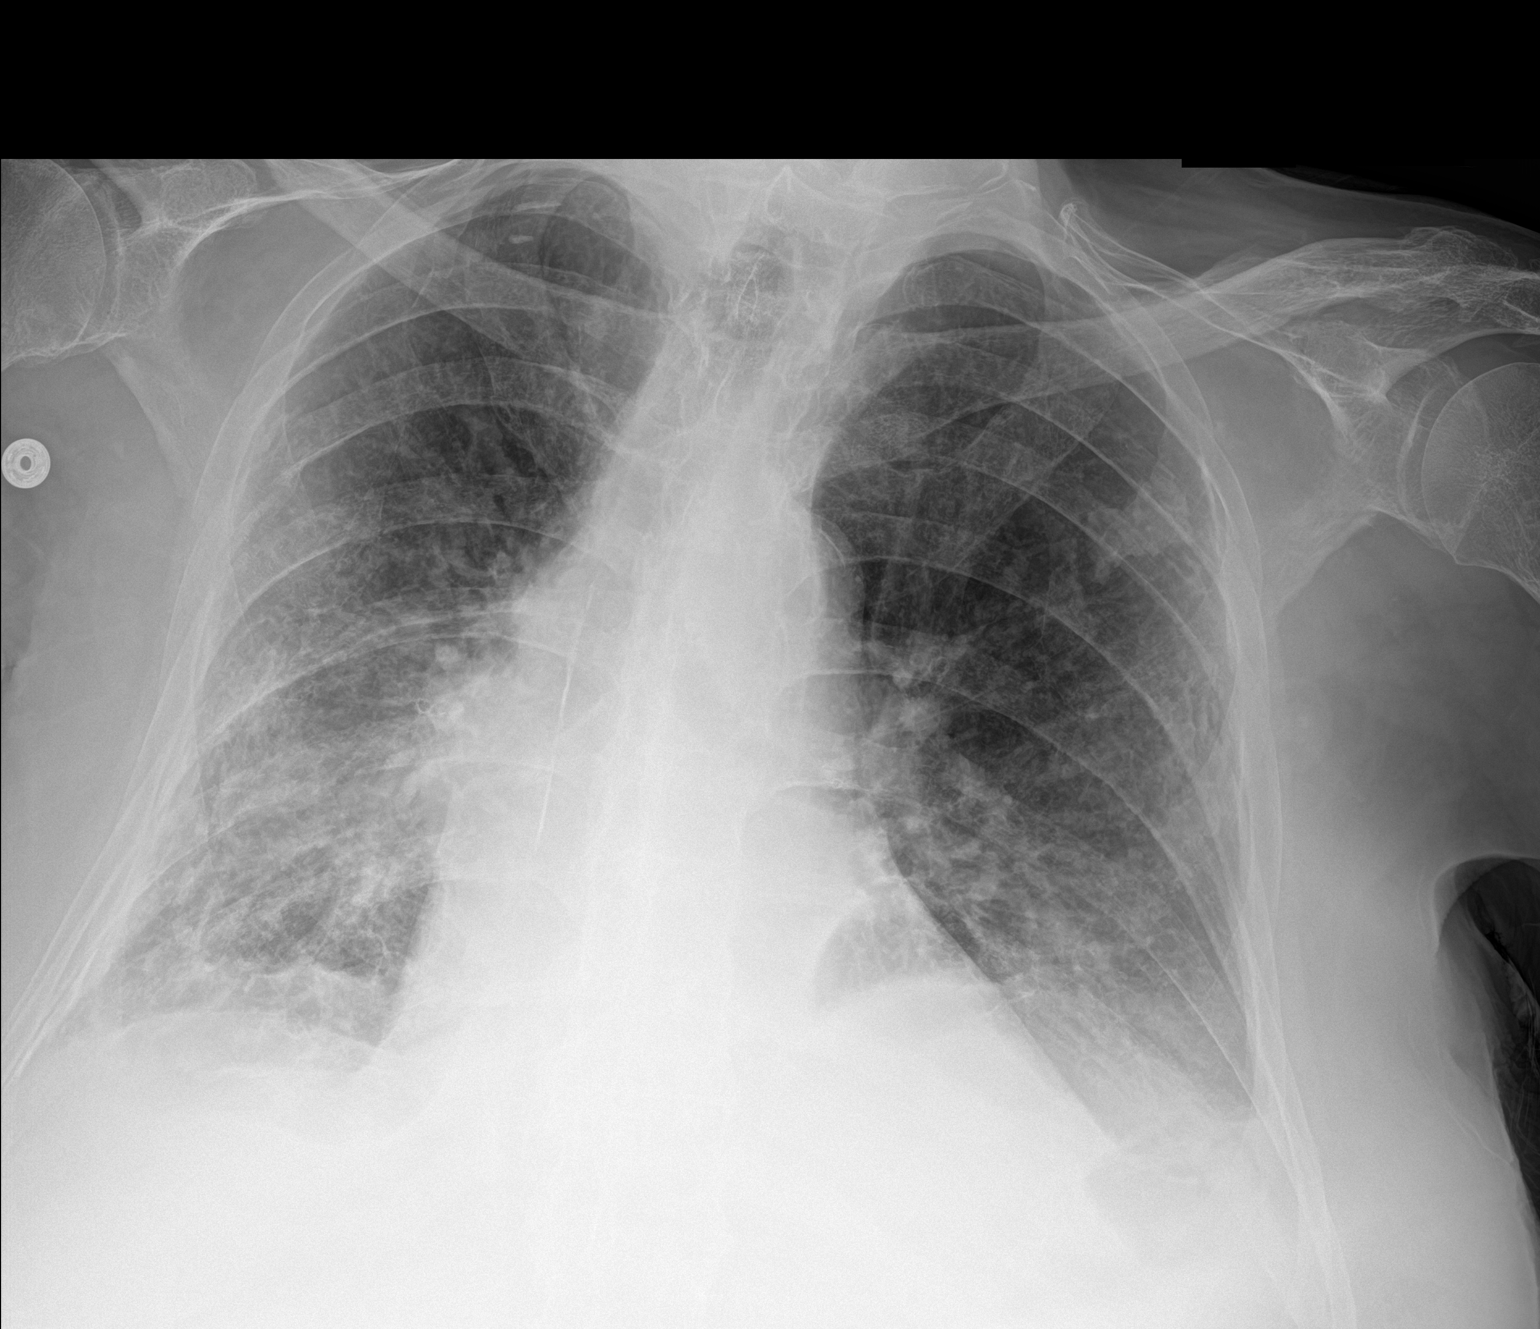

[1 of 1 positions shown; findings below may reference images not displayed]

FINDINGS: The cardiomediastinal silhouette is enlarged in contour. Likely
small bilateral pleural effusions. No pneumothorax. Atherosclerotic
calcifications of the aorta. Peribronchial cuffing. Hilar fullness
and peripheral interstitial markings. Questionable nodule versus
bone island of the RIGHT apex. Visualized abdomen is unremarkable.
No acute osseous abnormality noted.
IMPRESSION: 1. Findings suggestive of pulmonary edema with small bilateral
pleural effusions.
2. Questionable nodule versus bone island of the RIGHT apex.
Recommend follow-up PA and lateral radiograph.
